# Patient Record
Sex: Female | Born: 1985 | Race: White | Hispanic: No | Marital: Single
Health system: Midwestern US, Community
[De-identification: ages and names within clinical notes are randomized; demographics above are authoritative.]

## PROBLEM LIST (undated history)

## (undated) DIAGNOSIS — F419 Anxiety disorder, unspecified: Secondary | ICD-10-CM

## (undated) DIAGNOSIS — F32A Depression, unspecified: Secondary | ICD-10-CM

## (undated) DIAGNOSIS — A770 Spotted fever due to Rickettsia rickettsii: Secondary | ICD-10-CM

## (undated) DIAGNOSIS — K219 Gastro-esophageal reflux disease without esophagitis: Secondary | ICD-10-CM

## (undated) DIAGNOSIS — F329 Major depressive disorder, single episode, unspecified: Secondary | ICD-10-CM

## (undated) DIAGNOSIS — Z3201 Encounter for pregnancy test, result positive: Secondary | ICD-10-CM

## (undated) DIAGNOSIS — F418 Other specified anxiety disorders: Secondary | ICD-10-CM

## (undated) DIAGNOSIS — O365931 Maternal care for other known or suspected poor fetal growth, third trimester, fetus 1: Secondary | ICD-10-CM

## (undated) DIAGNOSIS — Z3481 Encounter for supervision of other normal pregnancy, first trimester: Principal | ICD-10-CM

## (undated) DIAGNOSIS — J101 Influenza due to other identified influenza virus with other respiratory manifestations: Secondary | ICD-10-CM

## (undated) DIAGNOSIS — Z3493 Encounter for supervision of normal pregnancy, unspecified, third trimester: Secondary | ICD-10-CM

## (undated) DIAGNOSIS — O365930 Maternal care for other known or suspected poor fetal growth, third trimester, not applicable or unspecified: Secondary | ICD-10-CM

## (undated) DIAGNOSIS — O169 Unspecified maternal hypertension, unspecified trimester: Secondary | ICD-10-CM

## (undated) DIAGNOSIS — O365990 Maternal care for other known or suspected poor fetal growth, unspecified trimester, not applicable or unspecified: Secondary | ICD-10-CM

## (undated) DIAGNOSIS — Z3482 Encounter for supervision of other normal pregnancy, second trimester: Secondary | ICD-10-CM

## (undated) DIAGNOSIS — K3189 Other diseases of stomach and duodenum: Secondary | ICD-10-CM

## (undated) DIAGNOSIS — Z3483 Encounter for supervision of other normal pregnancy, third trimester: Secondary | ICD-10-CM

## (undated) HISTORY — PX: TONSILLECTOMY: SUR1361

## (undated) HISTORY — DX: Spotted fever due to Rickettsia rickettsii: A77.0

## (undated) HISTORY — DX: Major depressive disorder, single episode, unspecified: F32.9

## (undated) HISTORY — DX: Gastro-esophageal reflux disease without esophagitis: K21.9

## (undated) HISTORY — DX: Depression, unspecified: F32.A

## (undated) HISTORY — DX: Anxiety disorder, unspecified: F41.9

---
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
## 2007-02-10 ENCOUNTER — Emergency Department (HOSPITAL_COMMUNITY): Admission: EM | Admit: 2007-02-10 | Discharge: 2007-02-10 | Payer: Self-pay | Admitting: Emergency Medicine

## 2007-04-14 ENCOUNTER — Ambulatory Visit: Payer: Self-pay | Admitting: Family Medicine

## 2007-04-14 DIAGNOSIS — J209 Acute bronchitis, unspecified: Secondary | ICD-10-CM | POA: Insufficient documentation

## 2007-04-14 LAB — CONVERTED CEMR LAB: Rapid Strep: NEGATIVE

## 2007-04-24 ENCOUNTER — Telehealth: Payer: Self-pay | Admitting: Family Medicine

## 2007-04-29 ENCOUNTER — Telehealth: Payer: Self-pay | Admitting: Family Medicine

## 2007-05-13 ENCOUNTER — Ambulatory Visit: Payer: Self-pay | Admitting: Family Medicine

## 2007-05-13 DIAGNOSIS — J039 Acute tonsillitis, unspecified: Secondary | ICD-10-CM | POA: Insufficient documentation

## 2007-05-13 LAB — CONVERTED CEMR LAB
Heterophile Ab Screen: NEGATIVE
Rapid Strep: NEGATIVE

## 2007-05-18 ENCOUNTER — Ambulatory Visit: Payer: Self-pay | Admitting: Internal Medicine

## 2007-05-19 LAB — CONVERTED CEMR LAB
Basophils Absolute: 0 10*3/uL (ref 0.0–0.1)
Basophils Relative: 0 % (ref 0.0–1.0)
EBV NA IgG: 0.45
EBV VCA IgG: 0.23
EBV VCA IgM: 0.22
Eosinophils Absolute: 0.1 10*3/uL (ref 0.0–0.6)
Eosinophils Relative: 0.9 % (ref 0.0–5.0)
HCT: 42 % (ref 36.0–46.0)
Hemoglobin: 14 g/dL (ref 12.0–15.0)
Lymphocytes Relative: 23.7 % (ref 12.0–46.0)
MCHC: 33.3 g/dL (ref 30.0–36.0)
MCV: 94.4 fL (ref 78.0–100.0)
Monocytes Absolute: 0.4 10*3/uL (ref 0.2–0.7)
Monocytes Relative: 4.8 % (ref 3.0–11.0)
Neutro Abs: 5.9 10*3/uL (ref 1.4–7.7)
Neutrophils Relative %: 70.6 % (ref 43.0–77.0)
Platelets: 334 10*3/uL (ref 150–400)
RBC: 4.45 M/uL (ref 3.87–5.11)
RDW: 11.6 % (ref 11.5–14.6)
WBC: 8.4 10*3/uL (ref 4.5–10.5)

## 2007-05-29 ENCOUNTER — Emergency Department (HOSPITAL_COMMUNITY): Admission: EM | Admit: 2007-05-29 | Discharge: 2007-05-30 | Payer: Self-pay | Admitting: Emergency Medicine

## 2007-06-05 ENCOUNTER — Encounter (INDEPENDENT_AMBULATORY_CARE_PROVIDER_SITE_OTHER): Payer: Self-pay | Admitting: Otolaryngology

## 2007-06-05 ENCOUNTER — Encounter: Payer: Self-pay | Admitting: Family Medicine

## 2007-06-05 ENCOUNTER — Ambulatory Visit (HOSPITAL_BASED_OUTPATIENT_CLINIC_OR_DEPARTMENT_OTHER): Admission: RE | Admit: 2007-06-05 | Discharge: 2007-06-05 | Payer: Self-pay | Admitting: Otolaryngology

## 2007-08-05 ENCOUNTER — Ambulatory Visit: Payer: Self-pay | Admitting: Family Medicine

## 2007-08-05 DIAGNOSIS — J019 Acute sinusitis, unspecified: Secondary | ICD-10-CM

## 2007-08-05 LAB — CONVERTED CEMR LAB: Rapid Strep: NEGATIVE

## 2008-04-05 ENCOUNTER — Ambulatory Visit: Payer: Self-pay | Admitting: Internal Medicine

## 2008-04-05 ENCOUNTER — Ambulatory Visit: Payer: Self-pay | Admitting: Family Medicine

## 2008-04-05 ENCOUNTER — Inpatient Hospital Stay (HOSPITAL_COMMUNITY): Admission: EM | Admit: 2008-04-05 | Discharge: 2008-04-06 | Payer: Self-pay | Admitting: Emergency Medicine

## 2008-04-05 DIAGNOSIS — N739 Female pelvic inflammatory disease, unspecified: Secondary | ICD-10-CM | POA: Insufficient documentation

## 2008-04-05 LAB — CONVERTED CEMR LAB
Bilirubin Urine: NEGATIVE
Glucose, Urine, Semiquant: NEGATIVE
Ketones, urine, test strip: NEGATIVE
Nitrite: NEGATIVE
Specific Gravity, Urine: 1.02
Urobilinogen, UA: 1
pH: 7

## 2008-04-06 ENCOUNTER — Encounter: Payer: Self-pay | Admitting: Family Medicine

## 2008-04-06 LAB — CONVERTED CEMR LAB
Chlamydia, DNA Probe: NEGATIVE
GC Probe Amp, Genital: NEGATIVE

## 2008-04-11 ENCOUNTER — Ambulatory Visit: Payer: Self-pay | Admitting: Family Medicine

## 2008-04-11 DIAGNOSIS — R1032 Left lower quadrant pain: Secondary | ICD-10-CM | POA: Insufficient documentation

## 2008-05-18 ENCOUNTER — Ambulatory Visit: Payer: Self-pay | Admitting: Family Medicine

## 2008-05-18 DIAGNOSIS — J029 Acute pharyngitis, unspecified: Secondary | ICD-10-CM | POA: Insufficient documentation

## 2008-05-18 LAB — CONVERTED CEMR LAB: Rapid Strep: NEGATIVE

## 2008-06-21 ENCOUNTER — Ambulatory Visit: Payer: Self-pay | Admitting: Family Medicine

## 2008-06-21 DIAGNOSIS — F329 Major depressive disorder, single episode, unspecified: Secondary | ICD-10-CM

## 2008-06-21 DIAGNOSIS — K219 Gastro-esophageal reflux disease without esophagitis: Secondary | ICD-10-CM | POA: Insufficient documentation

## 2008-06-21 LAB — CONVERTED CEMR LAB
Beta hcg, urine, semiquantitative: NEGATIVE
Bilirubin Urine: NEGATIVE
Blood in Urine, dipstick: NEGATIVE
Glucose, Urine, Semiquant: NEGATIVE
Ketones, urine, test strip: NEGATIVE
Nitrite: NEGATIVE
Specific Gravity, Urine: 1.025
Urobilinogen, UA: 0.2
WBC Urine, dipstick: NEGATIVE
pH: 6.5

## 2009-04-18 ENCOUNTER — Ambulatory Visit: Payer: Self-pay | Admitting: Family Medicine

## 2009-04-18 ENCOUNTER — Telehealth: Payer: Self-pay | Admitting: Family Medicine

## 2009-04-19 LAB — CONVERTED CEMR LAB
ALT: 17 units/L (ref 0–35)
AST: 20 units/L (ref 0–37)
Albumin: 4.2 g/dL (ref 3.5–5.2)
Alkaline Phosphatase: 48 units/L (ref 39–117)
BUN: 11 mg/dL (ref 6–23)
Basophils Absolute: 0 10*3/uL (ref 0.0–0.1)
Basophils Relative: 0.4 % (ref 0.0–3.0)
Bilirubin, Direct: 0.1 mg/dL (ref 0.0–0.3)
CO2: 30 meq/L (ref 19–32)
Calcium: 9.3 mg/dL (ref 8.4–10.5)
Chloride: 106 meq/L (ref 96–112)
Creatinine, Ser: 0.8 mg/dL (ref 0.4–1.2)
Eosinophils Absolute: 0.2 10*3/uL (ref 0.0–0.7)
Eosinophils Relative: 2.1 % (ref 0.0–5.0)
GFR calc non Af Amer: 95.66 mL/min (ref >60)
Glucose, Bld: 90 mg/dL (ref 70–99)
HCT: 42.4 % (ref 36.0–46.0)
Hemoglobin: 14.2 g/dL (ref 12.0–15.0)
Lymphocytes Relative: 32.4 % (ref 12.0–46.0)
Lymphs Abs: 2.5 10*3/uL (ref 0.7–4.0)
MCHC: 33.5 g/dL (ref 30.0–36.0)
MCV: 96.2 fL (ref 78.0–100.0)
Monocytes Absolute: 1 10*3/uL (ref 0.1–1.0)
Monocytes Relative: 12.7 % — ABNORMAL HIGH (ref 3.0–12.0)
Neutro Abs: 3.9 10*3/uL (ref 1.4–7.7)
Neutrophils Relative %: 52.4 % (ref 43.0–77.0)
Platelets: 227 10*3/uL (ref 150.0–400.0)
Potassium: 3.9 meq/L (ref 3.5–5.1)
RBC: 4.41 M/uL (ref 3.87–5.11)
RDW: 12.1 % (ref 11.5–14.6)
Sodium: 140 meq/L (ref 135–145)
TSH: 0.83 microintl units/mL (ref 0.35–5.50)
Total Bilirubin: 0.8 mg/dL (ref 0.3–1.2)
Total Protein: 6.7 g/dL (ref 6.0–8.3)
WBC: 7.6 10*3/uL (ref 4.5–10.5)

## 2009-05-26 ENCOUNTER — Ambulatory Visit: Payer: Self-pay | Admitting: Family Medicine

## 2009-05-26 DIAGNOSIS — R1031 Right lower quadrant pain: Secondary | ICD-10-CM

## 2009-05-26 DIAGNOSIS — F411 Generalized anxiety disorder: Secondary | ICD-10-CM | POA: Insufficient documentation

## 2009-09-05 ENCOUNTER — Encounter: Payer: Self-pay | Admitting: Family Medicine

## 2009-11-13 ENCOUNTER — Ambulatory Visit: Payer: Self-pay | Admitting: Family Medicine

## 2009-11-13 DIAGNOSIS — M545 Low back pain: Secondary | ICD-10-CM

## 2009-11-13 DIAGNOSIS — N912 Amenorrhea, unspecified: Secondary | ICD-10-CM

## 2009-11-13 LAB — CONVERTED CEMR LAB
Beta hcg, urine, semiquantitative: NEGATIVE
Bilirubin Urine: NEGATIVE
Blood in Urine, dipstick: NEGATIVE
Glucose, Urine, Semiquant: NEGATIVE
Ketones, urine, test strip: NEGATIVE
Nitrite: NEGATIVE
Protein, U semiquant: NEGATIVE
Specific Gravity, Urine: 1.02
Urobilinogen, UA: 0.2
pH: 7

## 2009-11-24 ENCOUNTER — Ambulatory Visit: Payer: Self-pay | Admitting: Family Medicine

## 2009-11-24 ENCOUNTER — Emergency Department (HOSPITAL_COMMUNITY): Admission: EM | Admit: 2009-11-24 | Discharge: 2009-11-24 | Payer: Self-pay | Admitting: Emergency Medicine

## 2009-11-24 DIAGNOSIS — R112 Nausea with vomiting, unspecified: Secondary | ICD-10-CM

## 2009-11-24 DIAGNOSIS — R5383 Other fatigue: Secondary | ICD-10-CM

## 2009-11-24 DIAGNOSIS — R5381 Other malaise: Secondary | ICD-10-CM

## 2009-11-25 ENCOUNTER — Encounter: Payer: Self-pay | Admitting: Family Medicine

## 2009-11-25 ENCOUNTER — Emergency Department (HOSPITAL_COMMUNITY): Admission: EM | Admit: 2009-11-25 | Discharge: 2009-11-26 | Payer: Self-pay | Admitting: Emergency Medicine

## 2009-11-26 ENCOUNTER — Emergency Department (HOSPITAL_COMMUNITY): Admission: EM | Admit: 2009-11-26 | Discharge: 2009-11-26 | Payer: Self-pay | Admitting: Emergency Medicine

## 2009-11-29 ENCOUNTER — Ambulatory Visit: Payer: Self-pay | Admitting: Family Medicine

## 2009-12-13 ENCOUNTER — Ambulatory Visit: Payer: Self-pay | Admitting: Family Medicine

## 2009-12-13 DIAGNOSIS — R51 Headache: Secondary | ICD-10-CM

## 2009-12-13 DIAGNOSIS — R519 Headache, unspecified: Secondary | ICD-10-CM | POA: Insufficient documentation

## 2009-12-15 ENCOUNTER — Ambulatory Visit: Payer: Self-pay | Admitting: Family Medicine

## 2009-12-17 ENCOUNTER — Emergency Department (HOSPITAL_COMMUNITY): Admission: EM | Admit: 2009-12-17 | Discharge: 2009-12-17 | Payer: Self-pay | Admitting: Emergency Medicine

## 2009-12-18 ENCOUNTER — Telehealth: Payer: Self-pay | Admitting: Family Medicine

## 2009-12-20 ENCOUNTER — Ambulatory Visit: Payer: Self-pay | Admitting: Family Medicine

## 2009-12-25 ENCOUNTER — Ambulatory Visit: Payer: Self-pay | Admitting: Family Medicine

## 2010-01-09 ENCOUNTER — Telehealth: Payer: Self-pay | Admitting: Family Medicine

## 2010-01-09 ENCOUNTER — Emergency Department (HOSPITAL_COMMUNITY): Admission: EM | Admit: 2010-01-09 | Discharge: 2010-01-09 | Payer: Self-pay | Admitting: Emergency Medicine

## 2010-01-10 ENCOUNTER — Ambulatory Visit: Payer: Self-pay | Admitting: Family Medicine

## 2010-01-14 ENCOUNTER — Inpatient Hospital Stay (HOSPITAL_COMMUNITY): Admission: AD | Admit: 2010-01-14 | Discharge: 2010-01-15 | Payer: Self-pay | Admitting: Obstetrics and Gynecology

## 2010-01-17 ENCOUNTER — Encounter: Payer: Self-pay | Admitting: Family Medicine

## 2010-01-27 ENCOUNTER — Emergency Department (HOSPITAL_COMMUNITY): Admission: EM | Admit: 2010-01-27 | Discharge: 2010-01-27 | Payer: Self-pay | Admitting: Emergency Medicine

## 2010-01-30 ENCOUNTER — Ambulatory Visit: Payer: Self-pay | Admitting: Family Medicine

## 2010-01-31 ENCOUNTER — Telehealth: Payer: Self-pay | Admitting: Family Medicine

## 2010-01-31 LAB — CONVERTED CEMR LAB
Basophils Absolute: 0 10*3/uL (ref 0.0–0.1)
Basophils Relative: 0.2 % (ref 0.0–3.0)
Eosinophils Absolute: 0 10*3/uL (ref 0.0–0.7)
Eosinophils Relative: 0.2 % (ref 0.0–5.0)
Free T4: 1.25 ng/dL (ref 0.60–1.60)
HCT: 39.3 % (ref 36.0–46.0)
Hemoglobin: 13.7 g/dL (ref 12.0–15.0)
Lymphocytes Relative: 19.6 % (ref 12.0–46.0)
Lymphs Abs: 2 10*3/uL (ref 0.7–4.0)
MCHC: 34.9 g/dL (ref 30.0–36.0)
MCV: 95 fL (ref 78.0–100.0)
Monocytes Absolute: 0.7 10*3/uL (ref 0.1–1.0)
Monocytes Relative: 7.1 % (ref 3.0–12.0)
Neutro Abs: 7.4 10*3/uL (ref 1.4–7.7)
Neutrophils Relative %: 72.9 % (ref 43.0–77.0)
Platelets: 275 10*3/uL (ref 150.0–400.0)
RBC: 4.14 M/uL (ref 3.87–5.11)
RDW: 13.5 % (ref 11.5–14.6)
Sed Rate: 10 mm/hr (ref 0–22)
T3, Free: 3.7 pg/mL (ref 2.3–4.2)
TSH: 0.34 microintl units/mL — ABNORMAL LOW (ref 0.35–5.50)
WBC: 10.2 10*3/uL (ref 4.5–10.5)

## 2010-02-02 ENCOUNTER — Telehealth: Payer: Self-pay | Admitting: Family Medicine

## 2010-02-02 DIAGNOSIS — G479 Sleep disorder, unspecified: Secondary | ICD-10-CM | POA: Insufficient documentation

## 2010-02-16 ENCOUNTER — Ambulatory Visit: Payer: Self-pay | Admitting: Family Medicine

## 2010-02-19 ENCOUNTER — Ambulatory Visit: Payer: Self-pay | Admitting: Pulmonary Disease

## 2010-02-20 ENCOUNTER — Telehealth: Payer: Self-pay | Admitting: Pulmonary Disease

## 2010-02-28 ENCOUNTER — Emergency Department (HOSPITAL_COMMUNITY)
Admission: EM | Admit: 2010-02-28 | Discharge: 2010-03-01 | Payer: Self-pay | Source: Home / Self Care | Admitting: Emergency Medicine

## 2010-03-01 ENCOUNTER — Ambulatory Visit
Admission: RE | Admit: 2010-03-01 | Discharge: 2010-03-01 | Payer: Self-pay | Source: Home / Self Care | Attending: Family Medicine | Admitting: Family Medicine

## 2010-03-01 ENCOUNTER — Encounter: Payer: Self-pay | Admitting: Family Medicine

## 2010-03-01 DIAGNOSIS — R634 Abnormal weight loss: Secondary | ICD-10-CM

## 2010-03-01 DIAGNOSIS — Z9189 Other specified personal risk factors, not elsewhere classified: Secondary | ICD-10-CM | POA: Insufficient documentation

## 2010-03-02 ENCOUNTER — Emergency Department (HOSPITAL_COMMUNITY)
Admission: EM | Admit: 2010-03-02 | Discharge: 2010-03-02 | Payer: Self-pay | Source: Home / Self Care | Admitting: Emergency Medicine

## 2010-03-02 ENCOUNTER — Encounter: Payer: Self-pay | Admitting: Family Medicine

## 2010-03-08 ENCOUNTER — Ambulatory Visit
Admission: RE | Admit: 2010-03-08 | Discharge: 2010-03-08 | Payer: Self-pay | Source: Home / Self Care | Attending: Family Medicine | Admitting: Family Medicine

## 2010-03-08 DIAGNOSIS — R1319 Other dysphagia: Secondary | ICD-10-CM | POA: Insufficient documentation

## 2010-03-08 DIAGNOSIS — Z8669 Personal history of other diseases of the nervous system and sense organs: Secondary | ICD-10-CM | POA: Insufficient documentation

## 2010-03-09 ENCOUNTER — Encounter: Payer: Self-pay | Admitting: Family Medicine

## 2010-03-13 ENCOUNTER — Telehealth: Payer: Self-pay | Admitting: Family Medicine

## 2010-03-16 LAB — CONVERTED CEMR LAB: EBV VCA IgG: 3 — ABNORMAL HIGH

## 2010-03-27 ENCOUNTER — Ambulatory Visit (HOSPITAL_COMMUNITY)
Admission: RE | Admit: 2010-03-27 | Discharge: 2010-03-27 | Payer: Self-pay | Source: Home / Self Care | Attending: Neurology | Admitting: Neurology

## 2010-04-03 NOTE — Assessment & Plan Note (Signed)
Summary: nausea/vomitting/cjr   Vital Signs:  Patient profile:   25 year old female Weight:      150 pounds Temp:     97.6 degrees F oral BP sitting:   90 / 72  (left arm) Cuff size:   regular  Vitals Entered By: Raechel Ache, RN (May 26, 2009 2:01 PM) CC: C/o nausea, vomiting all night and RLQ pain- thinks she has an ovarian cyst.   History of Present Illness: Here for several reasons. First she has had recurrent ovarian cysts for the past several years, and she thinks she has one now. She has had several  days of mild sharp RLQ pains which feel exactly like the other cysts felt. No fevers. No change in urinary or bowel habits. Also, she has had 24 hours of nausea and vomitting which is a little better today. last she would like to get back on Prozac since her anxiety is getting worse again.   Allergies: 1)  ! Levaquin 2)  ! Doxycycline 3)  ! Vicodin  Past History:  Past Medical History: Reviewed history from 06/21/2008 and no changes required. [place] Spotted Fever 2008 GERD Depression  Past Surgical History: Reviewed history from 04/11/2008 and no changes required.  Tonsillectomy  Review of Systems  The patient denies anorexia, fever, weight loss, weight gain, vision loss, decreased hearing, hoarseness, chest pain, syncope, dyspnea on exertion, peripheral edema, prolonged cough, headaches, hemoptysis, melena, hematochezia, severe indigestion/heartburn, hematuria, incontinence, genital sores, muscle weakness, suspicious skin lesions, transient blindness, difficulty walking, depression, unusual weight change, abnormal bleeding, enlarged lymph nodes, angioedema, breast masses, and testicular masses.    Physical Exam  General:  Well-developed,well-nourished,in no acute distress; alert,appropriate and cooperative throughout examination Head:  Normocephalic and atraumatic without obvious abnormalities. No apparent alopecia or balding. Eyes:  No corneal or  conjunctival inflammation noted. EOMI. Perrla. Funduscopic exam benign, without hemorrhages, exudates or papilledema. Vision grossly normal. Ears:  External ear exam shows no significant lesions or deformities.  Otoscopic examination reveals clear canals, tympanic membranes are intact bilaterally without bulging, retraction, inflammation or discharge. Hearing is grossly normal bilaterally. Nose:  External nasal examination shows no deformity or inflammation. Nasal mucosa are pink and moist without lesions or exudates. Mouth:  Oral mucosa and oropharynx without lesions or exudates.  Teeth in good repair. Lungs:  Normal respiratory effort, chest expands symmetrically. Lungs are clear to auscultation, no crackles or wheezes. Heart:  Normal rate and regular rhythm. S1 and S2 normal without gallop, murmur, click, rub or other extra sounds. Abdomen:  Bowel sounds positive,abdomen soft and non-tender without masses, organomegaly or hernias noted. Psych:  Oriented X3, memory intact for recent and remote, normally interactive, good eye contact, and slightly anxious.     Impression & Recommendations:  Problem # 1:  GASTROENTERITIS (ICD-558.9)  Problem # 2:  ANXIETY STATE, UNSPECIFIED (ICD-300.00)  Her updated medication list for this problem includes:    Prozac 40 Mg Caps (Fluoxetine hcl) ..... Once daily  Problem # 3:  ABDOMINAL PAIN, RIGHT LOWER QUADRANT (ICD-789.03)  Complete Medication List: 1)  Prozac 40 Mg Caps (Fluoxetine hcl) .... Once daily 2)  Omeprazole 40 Mg Cpdr (Omeprazole) .... Once daily 3)  Hydromet 5-1.5 Mg/86ml Syrp (Hydrocodone-homatropine) .Marland Kitchen.. 1 tsp every 4 hours as needed for cough  Other Orders: Gynecologic Referral (Gyn)  Patient Instructions: 1)  She will drink fluids and use Phenergan (which she has at home) for nausea. This should resolve quickly. Given a work note for today  through 05-30-09. She does seem to be having more problems with ovarian cysts, so we will refer  her to GYN. Start back on Prozac daily.  Prescriptions: PROZAC 40 MG CAPS (FLUOXETINE HCL) once daily  #30 x 5   Entered and Authorized by:   Nelwyn Salisbury MD   Signed by:   Nelwyn Salisbury MD on 05/26/2009   Method used:   Electronically to        [facility]* (retail)       [address]       [address] Shipping/mailing       [place], [place]  [address]       Ph: [phone]       Fax: [phone]   RxID:   [id number]

## 2010-04-03 NOTE — Progress Notes (Signed)
Summary: Pt req to get Ultrasound ordered asap today.   Phone Note Call from Patient Call back at [phone]   Caller: Patient Summary of Call: Pt called and is req Dr. Clent Ridges to order an ultrasound for today if at all possible. Pt took an at home pregnacy test and it came back positive. Pt is concerned about all the meds she has been taking the last 3 month and has been having pains in lower rt abdomen. Pt needs ultrasound done asap to see how far along the pregnancy is.  Initial call taken by: Lucy Antigua,  January 09, 2010 9:27 AM  Follow-up for Phone Call        she needs to go to the Maternity Admissions area at [facility] and they can do an [place] right there  Follow-up by: Nelwyn Salisbury MD,  January 09, 2010 1:22 PM  Additional Follow-up for Phone Call Additional follow up Details #1::        [facility] Additional Follow-up by: Lynann Beaver CMA AAMA,  January 09, 2010 2:17 PM    Additional Follow-up for Phone Call Additional follow up Details #2::    Pt did go to [facility] yesterday,and everything went well.   Follow-up by: Lynann Beaver CMA AAMA,  January 10, 2010 8:23 AM

## 2010-04-03 NOTE — Assessment & Plan Note (Signed)
Summary: fever/congestion/cold/njr   Vital Signs:  Patient profile:   25 year old female Weight:      151 pounds Temp:     98.2 degrees F oral Pulse rate:   92 / minute BP sitting:   114 / 66  (left arm) Cuff size:   regular  Vitals Entered By: Alfred Levins, CMA (April 18, 2009 9:34 AM) CC: cough   History of Present Illness: Here with her father for 3 days of chest burning, SOB, and a dry cough. No fever. She just moved back to [place] after spending a year going to school in [place]. While there the past year, she has had multiple episodes of upper respiratory infections, and they suggested she have a full workup to find out why.   Allergies: 1)  ! Levaquin 2)  ! Doxycycline 3)  ! Vicodin  Past History:  Past Medical History: Reviewed history from 06/21/2008 and no changes required. [place] Spotted Fever 2008 GERD Depression  Past Surgical History: Reviewed history from 04/11/2008 and no changes required.  Tonsillectomy  Review of Systems  The patient denies anorexia, fever, weight loss, weight gain, vision loss, decreased hearing, hoarseness, chest pain, syncope, dyspnea on exertion, peripheral edema, headaches, hemoptysis, abdominal pain, melena, hematochezia, severe indigestion/heartburn, hematuria, incontinence, genital sores, muscle weakness, suspicious skin lesions, transient blindness, difficulty walking, depression, unusual weight change, abnormal bleeding, enlarged lymph nodes, angioedema, breast masses, and testicular masses.    Physical Exam  General:  alert but appears ill, coughing Head:  Normocephalic and atraumatic without obvious abnormalities. No apparent alopecia or balding. Eyes:  No corneal or conjunctival inflammation noted. EOMI. Perrla. Funduscopic exam benign, without hemorrhages, exudates or papilledema. Vision grossly normal. Ears:  External ear exam shows no significant lesions or deformities.  Otoscopic examination reveals  clear canals, tympanic membranes are intact bilaterally without bulging, retraction, inflammation or discharge. Hearing is grossly normal bilaterally. Nose:  External nasal examination shows no deformity or inflammation. Nasal mucosa are pink and moist without lesions or exudates. Mouth:  Oral mucosa and oropharynx without lesions or exudates.  Teeth in good repair. Neck:  No deformities, masses, or tenderness noted. Lungs:  Normal respiratory effort, chest expands symmetrically. Lungs are clear to auscultation, no crackles or wheezes.   Impression & Recommendations:  Problem # 1:  ACUTE BRONCHITIS (ICD-466.0)  Her updated medication list for this problem includes:    Augmentin 875-125 Mg Tabs (Amoxicillin-pot clavulanate) .Marland Kitchen..Marland Kitchen Two times a day  Orders: Venipuncture (78295) TLB-BMP (Basic Metabolic Panel-BMET) (80048-METABOL) TLB-CBC Platelet - w/Differential (85025-CBCD) TLB-Hepatic/Liver Function Pnl (80076-HEPATIC) TLB-TSH (Thyroid Stimulating Hormone) (84443-TSH) T-2 View CXR (71020TC)  Complete Medication List: 1)  Prozac 40 Mg Caps (Fluoxetine hcl) .... Once daily 2)  Omeprazole 40 Mg Cpdr (Omeprazole) .... Once daily 3)  Augmentin 875-125 Mg Tabs (Amoxicillin-pot clavulanate) .... Two times a day  Patient Instructions: 1)  Treat the current episode as above. Get labs and a CXR today. Prescriptions: AUGMENTIN 875-125 MG TABS (AMOXICILLIN-POT CLAVULANATE) two times a day  #20 x 0   Entered and Authorized by:   Nelwyn Salisbury MD   Signed by:   Nelwyn Salisbury MD on 04/18/2009   Method used:   Electronically to        [facility]* (retail)       [address]       [address] Shipping/mailing       [place], [place]  [address]  Ph: [phone]       Fax: [phone]   RxID:   [id number]

## 2010-04-03 NOTE — Assessment & Plan Note (Signed)
Summary: READ TB TEST/CJR  Nurse Visit   Allergies: 1)  ! Levaquin 2)  ! Doxycycline  PPD Results    Date of reading: 12/15/2009    Results: < 5mm    Interpretation: negative

## 2010-04-03 NOTE — Assessment & Plan Note (Signed)
Summary: complete form for to withdraw from school/cjr   Vital Signs:  Patient profile:   25 year old female Weight:      152 pounds O2 Sat:      98 % Temp:     97.9 degrees F Pulse rate:   117 / minute Pulse (ortho):   98 / minute BP sitting:   110 / 80  (left arm)  Vitals Entered By: Pura Spice, RN (January 10, 2010 3:18 PM) CC: pregnant wants form completed for food stamps    History of Present Illness: Here to follow up after a visit yesterday to [facility] for missed periods and abdominal pain. She had a positive at home pregnancy test, and had a positive serum pregnancy test at the ER. They did a pelvic US but did not see anything, so clearly the pregnancy is very early. She already has an appt. with Dr. Marcelle Overlie her ObGyn on 01-12-10. She has been in contact with Velta Addison PA at Dr. Loralie [facility] office, and since she is pregnant he suggested she switch from Prozac to Zoloft. There is some evidence supporting the safety of Zoloft in pregnancy, and we agree that it would be a greater risk to stop all antidepressants at this point. She has stopped the Abilify. The ER diagnosed her with a bacterial vaginosis, so they gave her a 7 day course of Flagyl. She is still set to see Dr. Jake Samples on 02-13-10 for a Neurological evaluation about her HAs, her dizziness, and her difficulty in focusing and concentrating on anything. She lost her job, and in fact she is unable to work at all because she feels so bad right now. She has applied for Medicaid and for diability income, and she brings in a form for me to fill out for this. She has not driven a car for over a month, so her friend has driven her here. She moved out from her parents home about 4 months ago, and she has been living with this friend.   Allergies: 1)  ! Levaquin 2)  ! Doxycycline  Past History:  Past Medical History: [facility] Spotted Fever 2008 GERD Depression Anxiety, sees Velta Addison PA at Dr.  Loralie [facility]agne office sees Dr. Marcelle Overlie for ObGyn care  Past Surgical History: Reviewed history from 04/11/2008 and no changes required.  Tonsillectomy  Review of Systems  The patient denies anorexia, fever, weight loss, weight gain, vision loss, decreased hearing, hoarseness, chest pain, syncope, dyspnea on exertion, peripheral edema, prolonged cough, hemoptysis, melena, hematochezia, severe indigestion/heartburn, hematuria, incontinence, genital sores, muscle weakness, suspicious skin lesions, transient blindness, difficulty walking, depression, unusual weight change, abnormal bleeding, enlarged lymph nodes, angioedema, breast masses, and testicular masses.    Physical Exam  General:  alert but weak, speaks very softly, seems easily distracted  Lungs:  Normal respiratory effort, chest expands symmetrically. Lungs are clear to auscultation, no crackles or wheezes. Heart:  Normal rate and regular rhythm. S1 and S2 normal without gallop, murmur, click, rub or other extra sounds. Abdomen:  Bowel sounds positive,abdomen soft and non-tender without masses, organomegaly or hernias noted. Neurologic:  alert & oriented X3, cranial nerves II-XII intact, strength normal in all extremities, and gait normal.   Psych:  Oriented X3, flat affect, subdued, poor eye contact, and moderately anxious.     Impression & Recommendations:  Problem # 1:  HEADACHE (ICD-784.0)  Her updated medication list for this problem includes:    Diclofenac Sodium 50 Mg Tbec (Diclofenac  sodium) .Marland Kitchen... Three times a day as needed for pain    Vicodin 5-500 Mg Tabs (Hydrocodone-acetaminophen) .Marland Kitchen... 1 q 6 hours as needed pain    Fioricet 50-325-40 Mg Tabs (Butalbital-apap-caffeine) .Marland Kitchen... 1 q 6 hours as needed for ha  Problem # 2:  ANXIETY (ICD-300.00)  Her updated medication list for this problem includes:    Alprazolam 1 Mg Tabs (Alprazolam) .Marland Kitchen... 1 q 6 hours as needed for anxiety    Prozac 40 Mg Caps (Fluoxetine hcl) .....  Once daily  Problem # 3:  WEAKNESS (ICD-780.79)  Problem # 4:  DEPRESSION (ICD-311)  Her updated medication list for this problem includes:    Alprazolam 1 Mg Tabs (Alprazolam) .Marland Kitchen... 1 q 6 hours as needed for anxiety    Prozac 40 Mg Caps (Fluoxetine hcl) ..... Once daily  Problem # 5:  PREGNANCY (ICD-V22.2)  Complete Medication List: 1)  Diclofenac Sodium 50 Mg Tbec (Diclofenac sodium) .... Three times a day as needed for pain 2)  Alprazolam 1 Mg Tabs (Alprazolam) .Marland Kitchen.. 1 q 6 hours as needed for anxiety 3)  Prozac 40 Mg Caps (Fluoxetine hcl) .... Once daily 4)  Vicodin 5-500 Mg Tabs (Hydrocodone-acetaminophen) .Marland Kitchen.. 1 q 6 hours as needed pain 5)  Abilify 2 Mg Tabs (Aripiprazole) .... Once daily 6)  Fioricet 50-325-40 Mg Tabs (Butalbital-apap-caffeine) .Marland Kitchen.. 1 q 6 hours as needed for ha  Patient Instructions: 1)  I did fill out the application form she brought in, since I do feel she is totally unable to work for the time being. She will follow up with the specialists mentioned above.  2)  Please schedule a follow-up appointment as needed .    Orders Added: 1)  Est. Patient Level IV [64332]

## 2010-04-03 NOTE — Progress Notes (Signed)
Summary: QUESTION  Phone Note Call from Patient   Caller: Patient  [phone] Summary of Call: Pt called to see is she can obtain a referral to psychiatry...Marland KitchenMarland Kitchen Pt states she hasn't called for an appt yet because she wants to know who Dr Clent Ridges would recommend..... Pt can be reached at [phone].  Initial call taken by: Debbra Riding,  December 18, 2009 11:01 AM  Follow-up for Phone Call        I agree, and I would recommend seeing the office of Dr. Emerson Monte  Follow-up by: Nelwyn Salisbury MD,  December 18, 2009 1:09 PM  Additional Follow-up for Phone Call Additional follow up Details #1::        Phone Call Completed Additional Follow-up by: Rudy Jew, RN,  December 18, 2009 2:34 PM

## 2010-04-03 NOTE — Consult Note (Signed)
Summary: [facility]  [facility]   Imported By: Maryln Gottron 02/01/2010 10:56:46  _____________________________________________________________________  External Attachment:    Type:   Image     Comment:   External Document

## 2010-04-03 NOTE — Progress Notes (Signed)
Summary: Referral for sleep disorder request  Phone Note Call from Patient Call back at [phone]   Caller: Patient Call For: Nelwyn Salisbury MD Summary of Call: VM from pt requesting referral, sleep disorder concerns Initial call taken by: Sid Falcon LPN,  February 02, 2010 2:53 PM  Follow-up for Phone Call        refer to Dr. Marcelyn Bruins for sleep disorder evaluation  Follow-up by: Nelwyn Salisbury MD,  February 05, 2010 1:16 PM  Additional Follow-up for Phone Call Additional follow up Details #1::        done  Additional Follow-up by: Pura Spice, RN,  February 05, 2010 2:04 PM  New Problems: SLEEP DISORDER (ICD-780.50)   New Problems: SLEEP DISORDER (ICD-780.50)

## 2010-04-03 NOTE — Assessment & Plan Note (Signed)
Summary: er fup-? flu//ccm   Vital Signs:  Patient profile:   25 year old female O2 Sat:      98 % Temp:     98.3 degrees F Pulse rate:   90 / minute BP sitting:   128 / 86  (left arm)  Vitals Entered By: Pura Spice, RN (November 29, 2009 3:31 PM) CC: follow up ER visit. anxiety  and was told "flu"    History of Present Illness: Here to follow up after she was seen here on 11-26-09 for HAs and vomitting, when we sent her to the ER for further workup. While there she got some IV fluids. Her labs were negative except for a mildly elevated WBC count. She was told she had a viral infection and that she was having panic attacks. She was sent back home. Since then the vomitting has stopped, and the anxiety is a little better. However she still has HAs, sinus pressure, PND, a dry cough, and high levels of anxiety. No fever. Drinking fluids. She has been treated for anxiety in the past, and she did well on Prozac for a time.   Allergies: 1)  ! Levaquin 2)  ! Doxycycline 3)  ! Vicodin  Past History:  Past Medical History: [place] Spotted Fever 2008 GERD Depression Anxiety  Past Surgical History: Reviewed history from 04/11/2008 and no changes required.  Tonsillectomy  Review of Systems  The patient denies anorexia, fever, weight loss, weight gain, vision loss, decreased hearing, hoarseness, chest pain, syncope, dyspnea on exertion, peripheral edema, hemoptysis, abdominal pain, melena, hematochezia, severe indigestion/heartburn, hematuria, incontinence, genital sores, muscle weakness, suspicious skin lesions, transient blindness, difficulty walking, depression, unusual weight change, abnormal bleeding, enlarged lymph nodes, angioedema, breast masses, and testicular masses.    Physical Exam  General:  Well-developed,well-nourished,in no acute distress; alert,appropriate and cooperative throughout examination Head:  Normocephalic and atraumatic without obvious abnormalities.  No apparent alopecia or balding. Eyes:  No corneal or conjunctival inflammation noted. EOMI. Perrla. Funduscopic exam benign, without hemorrhages, exudates or papilledema. Vision grossly normal. Ears:  External ear exam shows no significant lesions or deformities.  Otoscopic examination reveals clear canals, tympanic membranes are intact bilaterally without bulging, retraction, inflammation or discharge. Hearing is grossly normal bilaterally. Nose:  External nasal examination shows no deformity or inflammation. Nasal mucosa are pink and moist without lesions or exudates. Mouth:  Oral mucosa and oropharynx without lesions or exudates.  Teeth in good repair. Neck:  No deformities, masses, or tenderness noted. Lungs:  Normal respiratory effort, chest expands symmetrically. Lungs are clear to auscultation, no crackles or wheezes. Psych:  Oriented X3, memory intact for recent and remote, normally interactive, good eye contact, and moderately anxious.     Impression & Recommendations:  Problem # 1:  ANXIETY (ICD-300.00)  Her updated medication list for this problem includes:    Hydroxyzine Hcl 25 Mg Tabs (Hydroxyzine hcl) .Marland Kitchen... 1 qid as needed    Prozac 20 Mg Caps (Fluoxetine hcl) ..... Once daily    Alprazolam 1 Mg Tabs (Alprazolam) .Marland Kitchen... 1 q 6 hours as needed for anxiety  Problem # 2:  ACUTE SINUSITIS, UNSPECIFIED (ICD-461.9)  Her updated medication list for this problem includes:    Zithromax Z-pak 250 Mg Tabs (Azithromycin) .Marland Kitchen... As directed  Complete Medication List: 1)  Diclofenac Sodium 50 Mg Tbec (Diclofenac sodium) .... Three times a day as needed for pain 2)  Hydroxyzine Hcl 25 Mg Tabs (Hydroxyzine hcl) .Marland Kitchen.. 1 qid as needed 3)  Zithromax Z-pak 250 Mg Tabs (Azithromycin) .... As directed 4)  Prozac 20 Mg Caps (Fluoxetine hcl) .... Once daily 5)  Alprazolam 1 Mg Tabs (Alprazolam) .Marland Kitchen.. 1 q 6 hours as needed for anxiety  Patient Instructions: 1)  Get back on Prozac with Xanax as  needed.  2)  Please schedule a follow-up appointment in 2 weeks.  Prescriptions: ALPRAZOLAM 1 MG TABS (ALPRAZOLAM) 1 q 6 hours as needed for anxiety  #60 x 2   Entered and Authorized by:   Nelwyn Salisbury MD   Signed by:   Nelwyn Salisbury MD on 11/29/2009   Method used:   Print then Give to Patient   RxID:   [id number] PROZAC 20 MG CAPS (FLUOXETINE HCL) once daily  #30 x 5   Entered and Authorized by:   Nelwyn Salisbury MD   Signed by:   Nelwyn Salisbury MD on 11/29/2009   Method used:   Print then Give to Patient   RxID:   [id number] ZITHROMAX Z-PAK 250 MG TABS (AZITHROMYCIN) as directed  #1 x 0   Entered and Authorized by:   Nelwyn Salisbury MD   Signed by:   Nelwyn Salisbury MD on 11/29/2009   Method used:   Print then Give to Patient   RxID:   [id number]

## 2010-04-03 NOTE — Assessment & Plan Note (Signed)
Summary: DISCUSS DEPRESSION MED/TB INJ AND FLU SHOT//SLM/PT RSC/CJR   Vital Signs:  Patient profile:   25 year old female Weight:      160 pounds O2 Sat:      98 % Temp:     98.1 degrees F Pulse rate:   91 / minute BP sitting:   120 / 80  Vitals Entered By: Pura Spice, RN (December 13, 2009 1:35 PM) CC: discuss depression and wants TB tine flu shot    History of Present Illness: She is feeling better with less depresison, but she still deals with a lot of anxiety. Averaging one Xanax a day. Having some HAs. She has been back to work.   Preventive Screening-Counseling & Management  Alcohol-Tobacco     Smoking Status: current  Allergies: 1)  ! Levaquin 2)  ! Doxycycline  Past History:  Past Medical History: Reviewed history from 11/29/2009 and no changes required. [place] Spotted Fever 2008 GERD Depression Anxiety  Social History: Smoking Status:  current  Review of Systems  The patient denies anorexia, fever, weight loss, weight gain, vision loss, decreased hearing, hoarseness, chest pain, syncope, dyspnea on exertion, peripheral edema, prolonged cough, hemoptysis, abdominal pain, melena, hematochezia, severe indigestion/heartburn, hematuria, incontinence, genital sores, muscle weakness, suspicious skin lesions, transient blindness, difficulty walking, unusual weight change, abnormal bleeding, enlarged lymph nodes, angioedema, breast masses, and testicular masses.         Flu Vaccine Consent Questions     Do you have a history of severe allergic reactions to this vaccine? no    Any prior history of allergic reactions to egg and/or gelatin? no    Do you have a sensitivity to the preservative Thimersol? no    Do you have a past history of Guillan-Barre Syndrome? no    Do you currently have an acute febrile illness? no    Have you ever had a severe reaction to latex? no    Vaccine information given and explained to patient? yes    Are you currently pregnant?  no    Lot Number:[id number]   Exp Date:09/01/2010   Site Given  Left Deltoid IM Pura Spice, RN  December 13, 2009 2:05 PM   Physical Exam  General:  Well-developed,well-nourished,in no acute distress; alert,appropriate and cooperative throughout examination Head:  Normocephalic and atraumatic without obvious abnormalities. No apparent alopecia or balding. Eyes:  No corneal or conjunctival inflammation noted. EOMI. Perrla. Funduscopic exam benign, without hemorrhages, exudates or papilledema. Vision grossly normal. Psych:  Cognition and judgment appear intact. Alert and cooperative with normal attention span and concentration. No apparent delusions, illusions, hallucinations   Impression & Recommendations:  Problem # 1:  ANXIETY (ICD-300.00)  The following medications were removed from the medication list:    Hydroxyzine Hcl 25 Mg Tabs (Hydroxyzine hcl) .Marland Kitchen... 1 qid as needed    Prozac 20 Mg Caps (Fluoxetine hcl) ..... Once daily Her updated medication list for this problem includes:    Alprazolam 1 Mg Tabs (Alprazolam) .Marland Kitchen... 1 q 6 hours as needed for anxiety    Prozac 40 Mg Caps (Fluoxetine hcl) ..... Once daily  Problem # 2:  DEPRESSION (ICD-311)  The following medications were removed from the medication list:    Hydroxyzine Hcl 25 Mg Tabs (Hydroxyzine hcl) .Marland Kitchen... 1 qid as needed    Prozac 20 Mg Caps (Fluoxetine hcl) ..... Once daily Her updated medication list for this problem includes:    Alprazolam 1 Mg Tabs (Alprazolam) .Marland Kitchen... 1 q 6  hours as needed for anxiety    Prozac 40 Mg Caps (Fluoxetine hcl) ..... Once daily  Problem # 3:  HEADACHE (ICD-784.0)  Her updated medication list for this problem includes:    Diclofenac Sodium 50 Mg Tbec (Diclofenac sodium) .Marland Kitchen... Three times a day as needed for pain    Vicodin 5-500 Mg Tabs (Hydrocodone-acetaminophen) .Marland Kitchen... 1 q 6 hours as needed pain  Complete Medication List: 1)  Diclofenac Sodium 50 Mg Tbec (Diclofenac sodium) .... Three  times a day as needed for pain 2)  Alprazolam 1 Mg Tabs (Alprazolam) .Marland Kitchen.. 1 q 6 hours as needed for anxiety 3)  Prozac 40 Mg Caps (Fluoxetine hcl) .... Once daily 4)  Vicodin 5-500 Mg Tabs (Hydrocodone-acetaminophen) .Marland Kitchen.. 1 q 6 hours as needed pain  Other Orders: Admin 1st Vaccine (47425) Flu Vaccine 49yrs + (95638) TB Skin Test (75643) Admin of Any Addtl Vaccine (32951)  Patient Instructions: 1)  We will increase the Prozac to 40 mg a day. Try Vicodin for HAs.  2)  Please schedule a follow-up appointment in 1 month.  Prescriptions: VICODIN 5-500 MG TABS (HYDROCODONE-ACETAMINOPHEN) 1 q 6 hours as needed pain  #60 x 2   Entered and Authorized by:   Nelwyn Salisbury MD   Signed by:   Nelwyn Salisbury MD on 12/13/2009   Method used:   Print then Give to Patient   RxID:   [id number] PROZAC 40 MG CAPS (FLUOXETINE HCL) once daily  #30 x 5   Entered and Authorized by:   Nelwyn Salisbury MD   Signed by:   Nelwyn Salisbury MD on 12/13/2009   Method used:   Print then Give to Patient   RxID:   [id number]    Immunizations Administered:  PPD Skin Test:    Vaccine Type: PPD    Site: left forearm    Mfr: Sanofi Pasteur    Dose: 0.1 ml    Route: ID    Given by: Pura Spice, RN    Exp. Date: 01/04/2011    Lot #: [id number]

## 2010-04-03 NOTE — Letter (Signed)
Summary: Medical Exam Form/[place] [employer]  Medical Exam Form/[place] [employer]   Imported By: Sherian Rein 01/16/2010 13:57:45  _____________________________________________________________________  External Attachment:    Type:   Image     Comment:   External Document

## 2010-04-03 NOTE — Assessment & Plan Note (Signed)
Summary: Pregnancy ????/dm   Vital Signs:  Patient profile:   25 year old female Weight:      165 pounds O2 Sat:      95 % Temp:     98.3 degrees F Pulse rate:   94 / minute BP sitting:   124 / 80  (left arm) Cuff size:   regular  Vitals Entered By: Pura Spice, RN (November 13, 2009 3:42 PM) CC: missed periord LMP v8-7-11 c/o low back pain    History of Present Illness: Here for several reasons. First she wants to have a pregnancy test since she is late. Her LMP was 10-09-09, and she is usually regular. She did a home test this am which was negative. She has had some cramps like a period but no bleeding. No vaginal DC. Also for the past 3 months she has had stiffness and pain in the lower back after she was dropped on the floor by a friend who was horseplaying with her. taking Motrin, using heat.   Allergies: 1)  ! Levaquin 2)  ! Doxycycline 3)  ! Vicodin  Past History:  Past Medical History: Reviewed history from 06/21/2008 and no changes required. [place] Spotted Fever 2008 GERD Depression  Past Surgical History: Reviewed history from 04/11/2008 and no changes required.  Tonsillectomy  Review of Systems  The patient denies anorexia, fever, weight loss, weight gain, vision loss, decreased hearing, hoarseness, chest pain, syncope, dyspnea on exertion, peripheral edema, prolonged cough, headaches, hemoptysis, abdominal pain, melena, hematochezia, severe indigestion/heartburn, hematuria, incontinence, genital sores, muscle weakness, suspicious skin lesions, transient blindness, difficulty walking, depression, unusual weight change, abnormal bleeding, enlarged lymph nodes, angioedema, breast masses, and testicular masses.    Physical Exam  General:  Well-developed,well-nourished,in no acute distress; alert,appropriate and cooperative throughout examination Abdomen:  Bowel sounds positive,abdomen soft and non-tender without masses, organomegaly or hernias  noted. Msk:  tender in the lower back with reduced ROM but negative SLR    Impression & Recommendations:  Problem # 1:  LOW BACK PAIN, MILD (ICD-724.2)  Her updated medication list for this problem includes:    Diclofenac Sodium 50 Mg Tbec (Diclofenac sodium) .Marland Kitchen... Three times a day as needed for pain  Orders: UA Dipstick w/o Micro (automated)  (81003)  Problem # 2:  AMENORRHEA (ICD-626.0)  Orders: Urine Pregnancy Test  ([address])  Problem # 3:  OVARIAN CYST, RUPTURED (ICD-620.2)  Complete Medication List: 1)  Prozac 40 Mg Caps (Fluoxetine hcl) .... Once daily 2)  Omeprazole 40 Mg Cpdr (Omeprazole) .... Once daily 3)  Hydromet 5-1.5 Mg/65ml Syrp (Hydrocodone-homatropine) .Marland Kitchen.. 1 tsp every 4 hours as needed for cough 4)  Diclofenac Sodium 50 Mg Tbec (Diclofenac sodium) .... Three times a day as needed for pain  Patient Instructions: 1)  Try Diclofenac. I also suggested getting some PT, but she will discuss this with her father first since he will be paying for this.  Prescriptions: DICLOFENAC SODIUM 50 MG TBEC (DICLOFENAC SODIUM) three times a day as needed for pain  #60 x 5   Entered and Authorized by:   Nelwyn Salisbury MD   Signed by:   Nelwyn Salisbury MD on 11/13/2009   Method used:   Electronically to        [facility] [phone]* (retail)       [address] [place], [place]  [address]       Ph: [phone] or [phone]  Fax: [phone]   RxID:   [id number]   Laboratory Results   Urine Tests  Date/Time Received: November 13, 2009 3:46 PM  Date/Time Reported: November 13, 2009 3:46 PM   Routine Urinalysis   Color: yellow Appearance: Clear Glucose: negative   (Normal Range: Negative) Bilirubin: negative   (Normal Range: Negative) Ketone: negative   (Normal Range: Negative) Spec. Gravity: 1.020   (Normal Range: 1.003-1.035) Blood: negative   (Normal Range: Negative) pH: 7.0   (Normal Range: 5.0-8.0) Protein: negative   (Normal Range:  Negative) Urobilinogen: 0.2   (Normal Range: 0-1) Nitrite: negative   (Normal Range: Negative) Leukocyte Esterace: trace   (Normal Range: Negative)    Urine HCG: negative Comments: Wynona Canes, CMA  November 13, 2009 3:46 PM       Laboratory Results   Urine Tests    Routine Urinalysis   Color: yellow Appearance: Clear Glucose: negative   (Normal Range: Negative) Bilirubin: negative   (Normal Range: Negative) Ketone: negative   (Normal Range: Negative) Spec. Gravity: 1.020   (Normal Range: 1.003-1.035) Blood: negative   (Normal Range: Negative) pH: 7.0   (Normal Range: 5.0-8.0) Protein: negative   (Normal Range: Negative) Urobilinogen: 0.2   (Normal Range: 0-1) Nitrite: negative   (Normal Range: Negative) Leukocyte Esterace: trace   (Normal Range: Negative)    Urine HCG: negative Comments: Wynona Canes, CMA  November 13, 2009 3:46 PM

## 2010-04-03 NOTE — Assessment & Plan Note (Signed)
Summary: MIGRAINE H/A, ANXIETY // RS   Vital Signs:  Patient profile:   25 year old female O2 Sat:      99 % Temp:     98.7 degrees F (37.06 degrees C) Pulse rate:   99 / minute BP sitting:   120 / 74  Vitals Entered By: Pura Spice, RN (December 20, 2009 11:41 AM) CC: sore throat cough pressure in head stated slept 8:30 am yest til 8:30 this am . states having alot anxiety and xanax not helping  vomiting not eating.    History of Present Illness: Here with her father for several ongoing problems. First, for the past 3 weeks she has been dealing with sinus pressure, HAs, blowing green mucus from the nose, PND, ST, and coughing up green sputum. She has had low grade fevers off and on. Has had nausea and vomitting, and has used some Phenergan. Very little appetite, but she is trying to drink fluids. She has taken a Zpack with no improvement. Also she has been struggling with high levels of anxiety and panic attacks. She has been taking Prozac along with Xanax, but this has not helped at all. Now she feels overwhelmed and out of control. She is not sleeping much at all. She is scheduled to see someone in the office of Dr. Emerson Monte later today for this.   Allergies: 1)  ! Levaquin 2)  ! Doxycycline  Past History:  Past Medical History: Reviewed history from 11/29/2009 and no changes required. [place] Spotted Fever 2008 GERD Depression Anxiety  Past Surgical History: Reviewed history from 04/11/2008 and no changes required.  Tonsillectomy  Review of Systems  The patient denies anorexia, fever, weight loss, weight gain, vision loss, decreased hearing, hoarseness, chest pain, syncope, dyspnea on exertion, peripheral edema, hemoptysis, abdominal pain, melena, hematochezia, severe indigestion/heartburn, hematuria, incontinence, genital sores, muscle weakness, suspicious skin lesions, transient blindness, difficulty walking, depression, unusual weight change, abnormal  bleeding, enlarged lymph nodes, angioedema, breast masses, and testicular masses.    Physical Exam  General:  very disheveled, wearing pajamas and flip flops. Is a bit lethargic but cooperates with the exam Head:  Normocephalic and atraumatic without obvious abnormalities. No apparent alopecia or balding. Eyes:  No corneal or conjunctival inflammation noted. EOMI. Perrla. Funduscopic exam benign, without hemorrhages, exudates or papilledema. Vision grossly normal. Ears:  External ear exam shows no significant lesions or deformities.  Otoscopic examination reveals clear canals, tympanic membranes are intact bilaterally without bulging, retraction, inflammation or discharge. Hearing is grossly normal bilaterally. Nose:  External nasal examination shows no deformity or inflammation. Nasal mucosa are pink and moist without lesions or exudates. Mouth:  Oral mucosa and oropharynx without lesions or exudates.  Teeth in good repair. Neck:  No deformities, masses, or tenderness noted. Lungs:  Normal respiratory effort, chest expands symmetrically. Lungs are clear to auscultation, no crackles or wheezes. Psych:  Oriented X3, memory intact for recent and remote, poor eye contact, tearful, and moderately anxious.     Impression & Recommendations:  Problem # 1:  ACUTE SINUSITIS, UNSPECIFIED (ICD-461.9)  Her updated medication list for this problem includes:    Keflex 500 Mg Caps (Cephalexin) .Marland Kitchen... Three times a day  Orders: Depo- Medrol 80mg  (J1040) Admin of Therapeutic Inj  intramuscular or subcutaneous (45409)  Problem # 2:  ANXIETY (ICD-300.00)  Her updated medication list for this problem includes:    Alprazolam 1 Mg Tabs (Alprazolam) .Marland Kitchen... 1 q 6 hours as needed for anxiety  Prozac 40 Mg Caps (Fluoxetine hcl) ..... Once daily  Complete Medication List: 1)  Diclofenac Sodium 50 Mg Tbec (Diclofenac sodium) .... Three times a day as needed for pain 2)  Alprazolam 1 Mg Tabs (Alprazolam) .Marland Kitchen.. 1  q 6 hours as needed for anxiety 3)  Prozac 40 Mg Caps (Fluoxetine hcl) .... Once daily 4)  Vicodin 5-500 Mg Tabs (Hydrocodone-acetaminophen) .Marland Kitchen.. 1 q 6 hours as needed pain 5)  Keflex 500 Mg Caps (Cephalexin) .... Three times a day  Patient Instructions: 1)  We will treat her sinus infection, but will leave her psychiatric treatment up to Dr. Loralie Champagne office. Her father is driving her today and is very supportive of her.  Prescriptions: KEFLEX 500 MG CAPS (CEPHALEXIN) three times a day  #30 x 0   Entered and Authorized by:   Nelwyn Salisbury MD   Signed by:   Nelwyn Salisbury MD on 12/20/2009   Method used:   Electronically to        [facility]* (retail)       [address]       [address] Shipping/mailing       [place], [place]  [address]       Ph: [phone]       Fax: [phone]   RxID:   [id number]    Medication Administration  Injection # 1:    Medication: Depo- Medrol 80mg     Diagnosis: ACUTE SINUSITIS, UNSPECIFIED (ICD-461.9)    Route: IM    Site: RUOQ gluteus    Exp Date: 6/14    Lot #: [id number]    Mfr: Pharmacia    Comments: 80mg  given    Patient tolerated injection without complications    Given by: Madison Hickman, RN (December 20, 2009 12:22 PM)  Orders Added: 1)  Depo- Medrol 80mg  [J1040] 2)  Admin of Therapeutic Inj  intramuscular or subcutaneous [96372] 3)  Est. Patient Level IV [84132]

## 2010-04-03 NOTE — Assessment & Plan Note (Signed)
Summary: congestion--ok per dr fry//ccm   Vital Signs:  Patient profile:   25 year old female O2 Sat:      99 % Temp:     97.9 degrees F Pulse rate:   88 / minute BP sitting:   130 / 80  (left arm)  Vitals Entered By: Pura Spice, RN (November 24, 2009 4:00 PM) CC: cough congestion seeing spots  legs feel numb achy body states hasn't seen psychiatrist     History of Present Illness: Here with her friend for worsening weakness, lightheadedness, and vomitting. For the past week she has felt bad, and has stayed out of work. She was seen here 2 weeks ago for missing a period when she was afraid she could be pregnant. That day a pregnancy test was negative and her UA was clear. Over the past week she describes nausea with occasional vomitting, diffuse body aches, weakness, and lightheadedness. No abdominal pain. No cough or SOB. No HA or fever. She has vomitted several times in the past 24 hours, including once here in my exam room. She also states that she has had blood in her stools for 2 months, sometimes bright red and sometimes darker red, never black. No urinary symptoms.   Allergies: 1)  ! Levaquin 2)  ! Doxycycline 3)  ! Vicodin  Past History:  Past Medical History: Reviewed history from 06/21/2008 and no changes required. [place] Spotted Fever 2008 GERD Depression  Past Surgical History: Reviewed history from 04/11/2008 and no changes required.  Tonsillectomy  Family History: Reviewed history from 06/21/2008 and no changes required. Family History Depression bipolar disorder  Social History: Reviewed history from 06/21/2008 and no changes required. Single Alcohol use-yes Drug use-no  Review of Systems  The patient denies anorexia, fever, weight loss, weight gain, vision loss, decreased hearing, hoarseness, chest pain, syncope, dyspnea on exertion, peripheral edema, prolonged cough, headaches, hemoptysis, abdominal pain, severe indigestion/heartburn,  hematuria, incontinence, genital sores, muscle weakness, suspicious skin lesions, transient blindness, difficulty walking, depression, unusual weight change, abnormal bleeding, enlarged lymph nodes, angioedema, breast masses, and testicular masses.    Physical Exam  General:  in a wheelchair, she appears weak and quite ill, she has trouble answering questions Head:  Normocephalic and atraumatic without obvious abnormalities. No apparent alopecia or balding. Eyes:  No corneal or conjunctival inflammation noted. EOMI. Perrla. Funduscopic exam benign, without hemorrhages, exudates or papilledema. Vision grossly normal. Ears:  External ear exam shows no significant lesions or deformities.  Otoscopic examination reveals clear canals, tympanic membranes are intact bilaterally without bulging, retraction, inflammation or discharge. Hearing is grossly normal bilaterally. Nose:  External nasal examination shows no deformity or inflammation. Nasal mucosa are pink and moist without lesions or exudates. Mouth:  Oral mucosa and oropharynx without lesions or exudates.  Teeth in good repair. Neck:  No deformities, masses, or tenderness noted. Lungs:  Normal respiratory effort, chest expands symmetrically. Lungs are clear to auscultation, no crackles or wheezes. Heart:  Normal rate and regular rhythm. S1 and S2 normal without gallop, murmur, click, rub or other extra sounds. Abdomen:  Bowel sounds positive,abdomen soft and non-tender without masses, organomegaly or hernias noted. Neurologic:  not very alert and somewhat disoriented. Cannot walk Psych:  flat affect, poor eye contact, and tearful.     Impression & Recommendations:  Problem # 1:  NAUSEA WITH VOMITING (ICD-787.01)  Problem # 2:  WEAKNESS (ICD-780.79)  Complete Medication List: 1)  Diclofenac Sodium 50 Mg Tbec (Diclofenac sodium) .... Three  times a day as needed for pain  Patient Instructions: 1)  She needs to be admitted for IV fluids and  for ufrther workup. Her friend will drive her to [facility] ER immediately for further evaluation and treatment.

## 2010-04-03 NOTE — Assessment & Plan Note (Signed)
Summary: SICK HEADACHES ANXIETY/PS   Vital Signs:  Patient profile:   25 year old female O2 Sat:      94 % Temp:     98.3 degrees F Pulse rate:   70 / minute BP sitting:   120 / 70  (left arm)  Vitals Entered By: Pura Spice, RN (December 25, 2009 3:37 PM) CC: headaches pressure    History of Present Illness: Here with continued HAs and complaints of pressure in her head. The Keflex has not helped at all. The steroid shot she had here last week helped for about 24 hours only. Her anxiety is under much better control now. She saw the PA at Dr. Loralie Champagne office last week, and they added Abilify to her meds. Of course, her labs and a head CT scan a few weeks ago at the ER was normal.   Allergies: 1)  ! Levaquin 2)  ! Doxycycline  Past History:  Past Medical History: Reviewed history from 11/29/2009 and no changes required. [place] Spotted Fever 2008 GERD Depression Anxiety  Past Surgical History: Reviewed history from 04/11/2008 and no changes required.  Tonsillectomy  Review of Systems  The patient denies anorexia, fever, weight loss, weight gain, vision loss, decreased hearing, hoarseness, chest pain, syncope, dyspnea on exertion, peripheral edema, prolonged cough, hemoptysis, abdominal pain, melena, hematochezia, severe indigestion/heartburn, hematuria, incontinence, genital sores, muscle weakness, suspicious skin lesions, transient blindness, difficulty walking, depression, unusual weight change, abnormal bleeding, enlarged lymph nodes, angioedema, breast masses, and testicular masses.    Physical Exam  General:  Well-developed,well-nourished,in no acute distress; alert,appropriate and cooperative throughout examination Head:  Normocephalic and atraumatic without obvious abnormalities. No apparent alopecia or balding. Eyes:  No corneal or conjunctival inflammation noted. EOMI. Perrla. Funduscopic exam benign, without hemorrhages, exudates or papilledema. Vision  grossly normal. Ears:  External ear exam shows no significant lesions or deformities.  Otoscopic examination reveals clear canals, tympanic membranes are intact bilaterally without bulging, retraction, inflammation or discharge. Hearing is grossly normal bilaterally. Nose:  External nasal examination shows no deformity or inflammation. Nasal mucosa are pink and moist without lesions or exudates. Mouth:  Oral mucosa and oropharynx without lesions or exudates.  Teeth in good repair. Neck:  No deformities, masses, or tenderness noted. Lungs:  Normal respiratory effort, chest expands symmetrically. Lungs are clear to auscultation, no crackles or wheezes. Heart:  Normal rate and regular rhythm. S1 and S2 normal without gallop, murmur, click, rub or other extra sounds. Neurologic:  No cranial nerve deficits noted. Station and gait are normal. Plantar reflexes are down-going bilaterally. DTRs are symmetrical throughout. Sensory, motor and coordinative functions appear intact. Psych:  Oriented X3, memory intact for recent and remote, normally interactive, good eye contact, and moderately anxious.     Impression & Recommendations:  Problem # 1:  HEADACHE (ICD-784.0)  Her updated medication list for this problem includes:    Diclofenac Sodium 50 Mg Tbec (Diclofenac sodium) .Marland Kitchen... Three times a day as needed for pain    Vicodin 5-500 Mg Tabs (Hydrocodone-acetaminophen) .Marland Kitchen... 1 q 6 hours as needed pain    Fioricet 50-325-40 Mg Tabs (Butalbital-apap-caffeine) .Marland Kitchen... 1 q 6 hours as needed for ha  Orders: Neurology Referral (Neuro)  Problem # 2:  ANXIETY (ICD-300.00)  Her updated medication list for this problem includes:    Alprazolam 1 Mg Tabs (Alprazolam) .Marland Kitchen... 1 q 6 hours as needed for anxiety    Prozac 40 Mg Caps (Fluoxetine hcl) ..... Once daily  Complete Medication  List: 1)  Diclofenac Sodium 50 Mg Tbec (Diclofenac sodium) .... Three times a day as needed for pain 2)  Alprazolam 1 Mg Tabs  (Alprazolam) .Marland Kitchen.. 1 q 6 hours as needed for anxiety 3)  Prozac 40 Mg Caps (Fluoxetine hcl) .... Once daily 4)  Vicodin 5-500 Mg Tabs (Hydrocodone-acetaminophen) .Marland Kitchen.. 1 q 6 hours as needed pain 5)  Keflex 500 Mg Caps (Cephalexin) .... Three times a day 6)  Abilify 2 Mg Tabs (Aripiprazole) .... Once daily 7)  Fioricet 50-325-40 Mg Tabs (Butalbital-apap-caffeine) .Marland Kitchen.. 1 q 6 hours as needed for ha  Patient Instructions: 1)  his is still consistent with a migraine type HA, possibly with an element of tension HA as well. We will try Fioricet, and will set her up with Neurology soon.  Prescriptions: FIORICET 50-325-40 MG TABS (BUTALBITAL-APAP-CAFFEINE) 1 q 6 hours as needed for HA  #60 x 0   Entered and Authorized by:   Nelwyn Salisbury MD   Signed by:   Nelwyn Salisbury MD on 12/25/2009   Method used:   Print then Give to Patient   RxID:   [id number]    Orders Added: 1)  Est. Patient Level IV [24097] 2)  Neurology Referral [Neuro]

## 2010-04-03 NOTE — Progress Notes (Signed)
Summary: lab results  Phone Note Call from Patient Call back at [phone]   Caller: Patient---triage vm Reason for Call: Lab or Test Results Summary of Call: wants lab results. Initial call taken by: Warnell Forester,  January 31, 2010 1:07 PM  Follow-up for Phone Call        see results Follow-up by: Nelwyn Salisbury MD,  January 31, 2010 3:03 PM  Additional Follow-up for Phone Call Additional follow up Details #1::        pt called  Additional Follow-up by: Pura Spice, RN,  January 31, 2010 4:29 PM

## 2010-04-03 NOTE — Assessment & Plan Note (Signed)
Summary: POST HOSP F/U (ANXIETY) // RS   Vital Signs:  Patient profile:   25 year old female Weight:      152 pounds O2 Sat:      98 % Temp:     98.5 degrees F Pulse rate:   79 / minute BP sitting:   110 / 74  (left arm)  Vitals Entered By: Pura Spice, RN (January 30, 2010 2:31 PM) CC: 7 wks preg sees dr Marcelle Overlie  went to er sat for dehydration saw her psychiatrist and prozac changed to zoloft and now on xanax wants MRI and due to spinal pain ? about spinal tap    History of Present Illness: Here to follow up on HAs, neck pain, and anxiety. She is now about [redacted] weeks pregnant, and she is seeing Dr. Marcelle Overlie for this. She has seen Dr. Loralie Champagne office for the anxiety, and she has seen Dr. Clarisse Gouge for the [facility]. Dr. Clarisse Gouge said it would be helpful to get a CT or a contrasted MRI of her head, but of course we cannot do this now because of the pregancy. Even a non-contrasted MRI would be difficult to obtain because she would need large amounts of sedation to lie still for it due to her panic attacks. Dr. Clarisse Gouge ordered her to start PT to the neck twice a week, but this will not start until later this week. She was told to take nothing but Tylenol for her HAs. She is on Xanax and Zoloft per the psychiatry office. She is very anxious today and asks me what she should do.   Allergies: 1)  ! Levaquin 2)  ! Doxycycline  Past History:  Past Medical History: Reviewed history from 01/10/2010 and no changes required. [place] Spotted Fever 2008 GERD Depression Anxiety, sees Velta Addison PA at Dr. Loralie [facility]agne office sees Dr. Marcelle Overlie for ObGyn care  Review of Systems  The patient denies anorexia, fever, weight loss, weight gain, vision loss, decreased hearing, hoarseness, chest pain, syncope, dyspnea on exertion, peripheral edema, prolonged cough, hemoptysis, abdominal pain, melena, hematochezia, severe indigestion/heartburn, hematuria, incontinence, genital sores, muscle weakness, suspicious  skin lesions, transient blindness, difficulty walking, depression, unusual weight change, abnormal bleeding, enlarged lymph nodes, angioedema, breast masses, and testicular masses.    Physical Exam  General:  Well-developed,well-nourished,in no acute distress; alert,appropriate and cooperative throughout examination Neck:  No deformities, masses, or tenderness noted. Lungs:  Normal respiratory effort, chest expands symmetrically. Lungs are clear to auscultation, no crackles or wheezes. Heart:  Normal rate and regular rhythm. S1 and S2 normal without gallop, murmur, click, rub or other extra sounds. Neurologic:  No cranial nerve deficits noted. Station and gait are normal. Plantar reflexes are down-going bilaterally. DTRs are symmetrical throughout. Sensory, motor and coordinative functions appear intact. Psych:  Oriented X3, memory intact for recent and remote, normally interactive, good eye contact, and moderately anxious.     Impression & Recommendations:  Problem # 1:  HEADACHE (ICD-784.0)  The following medications were removed from the medication list:    Diclofenac Sodium 50 Mg Tbec (Diclofenac sodium) .Marland Kitchen... Three times a day as needed for pain    Vicodin 5-500 Mg Tabs (Hydrocodone-acetaminophen) .Marland Kitchen... 1 q 6 hours as needed pain Her updated medication list for this problem includes:    Fioricet 50-325-40 Mg Tabs (Butalbital-apap-caffeine) .Marland Kitchen... 1 q 6 hours as needed for ha  Orders: Venipuncture (16109) TLB-TSH (Thyroid Stimulating Hormone) (84443-TSH) TLB-CBC Platelet - w/Differential (85025-CBCD) TLB-Sedimentation Rate (ESR) (85652-ESR) TLB-T4 (Thyrox), Free (  87564-PP2R) TLB-T3, Free (Triiodothyronine) (84481-T3FREE) Specimen Handling (51884)  Problem # 2:  PREGNANCY (ICD-V22.2)  Orders: Specimen Handling (16606)  Problem # 3:  ANXIETY (ICD-300.00)  The following medications were removed from the medication list:    Alprazolam 1 Mg Tabs (Alprazolam) .Marland Kitchen... 1 q 6 hours as  needed for anxiety    Prozac 40 Mg Caps (Fluoxetine hcl) ..... Once daily Her updated medication list for this problem includes:    Zoloft 50 Mg Tabs (Sertraline hcl) ..... Once daily    Alprazolam 0.25 Mg Tabs (Alprazolam) .Marland Kitchen... 1/2 tab two times a day  Orders: Specimen Handling (30160)  Complete Medication List: 1)  Fioricet 50-325-40 Mg Tabs (Butalbital-apap-caffeine) .Marland Kitchen.. 1 q 6 hours as needed for ha 2)  Zoloft 50 Mg Tabs (Sertraline hcl) .... Once daily 3)  Alprazolam 0.25 Mg Tabs (Alprazolam) .... 1/2 tab two times a day  Patient Instructions: 1)  She has had some labwork done recently but I don't see where her thyroid levels have been checked. We will get some labs today. I advised her to start the PT since this is safe and may be effective for the HAs. otherwise she needs to follow up closely with the specialists above.    Orders Added: 1)  Venipuncture [36415] 2)  TLB-TSH (Thyroid Stimulating Hormone) [84443-TSH] 3)  TLB-CBC Platelet - w/Differential [85025-CBCD] 4)  TLB-Sedimentation Rate (ESR) [85652-ESR] 5)  TLB-T4 (Thyrox), Free [10932-TF5D] 6)  TLB-T3, Free (Triiodothyronine) [32202-R4YHCW] 7)  Est. Patient Level IV [23762] 8)  Specimen Handling [99000]

## 2010-04-03 NOTE — Letter (Signed)
Summary: [facility], Flu Symptoms  [facility], Flu Symptoms   Imported By: Maryln Gottron 09/14/2009 14:46:13  _____________________________________________________________________  External Attachment:    Type:   Image     Comment:   External Document

## 2010-04-03 NOTE — Progress Notes (Signed)
Summary: cough RX  Phone Note Call from Patient   Caller: Patient Call For: Nelwyn Salisbury MD Summary of Call: Pt need RX for cough called to [facility]. [phone] Initial call taken by: Lynann Beaver CMA,  April 18, 2009 11:33 AM  Follow-up for Phone Call        call in Hydromet 1 tsp q 4 hours as needed cough, 240 ml, no rf Follow-up by: Nelwyn Salisbury MD,  April 18, 2009 1:09 PM  Additional Follow-up for Phone Call Additional follow up Details #1::        Phone Call Completed, Rx Called In Additional Follow-up by: Alfred Levins, CMA,  April 18, 2009 1:40 PM    New/Updated Medications: HYDROMET 5-1.5 MG/5ML SYRP (HYDROCODONE-HOMATROPINE) 1 tsp every 4 hours as needed for cough Prescriptions: HYDROMET 5-1.5 MG/5ML SYRP (HYDROCODONE-HOMATROPINE) 1 tsp every 4 hours as needed for cough  #215mL x 0   Entered by:   Alfred Levins, CMA   Authorized by:   Nelwyn Salisbury MD   Signed by:   Alfred Levins, CMA on 04/18/2009   Method used:   Printed then faxed to ...       [facility]* (retail)       [address]       [address] Shipping/mailing       [place], [place]  [address]       Ph: [phone]       Fax: [phone]   RxID:   [id number]

## 2010-04-05 ENCOUNTER — Ambulatory Visit (HOSPITAL_COMMUNITY)
Admission: RE | Admit: 2010-04-05 | Discharge: 2010-04-05 | Disposition: A | Discharge status: Home / Self Care | Payer: Commercial Managed Care - PPO | Source: Ambulatory Visit | Attending: Neurology | Admitting: Neurology

## 2010-04-05 DIAGNOSIS — Z1389 Encounter for screening for other disorder: Secondary | ICD-10-CM | POA: Insufficient documentation

## 2010-04-05 DIAGNOSIS — R569 Unspecified convulsions: Secondary | ICD-10-CM | POA: Insufficient documentation

## 2010-04-05 NOTE — Progress Notes (Signed)
Summary: nos appt  Phone Note Call from Patient   Caller: [email or web address]  Call For: clance Summary of Call: In ref to nos from 12/19, pt states she will call to rsc. Initial call taken by: Darletta Moll,  February 20, 2010 3:46 PM

## 2010-04-05 NOTE — Assessment & Plan Note (Signed)
Summary: F/U FROM URGENT CARE HAD SEIZURE//SLM   Vital Signs:  Patient profile:   25 year old female Weight:      141 pounds Temp:     97.0 degrees F axillary BP sitting:   90 / 60  (left arm) Cuff size:   regular  Vitals Entered By: Sid Falcon LPN (March 08, 2010 10:17 AM)  History of Present Illness: Refer to prior note. Pt has had months of multiple symptoms including progressive headaches, fatigue, nausea without vomiting, weight loss among others.  Seen by multiple specialists.  This past friday reported ?tonic clonic seizure witnessed by a couple of friends who are  not here today to describe.  Reprtedly lasted about 15 minutes.  Pt taken to [facility] and  labs unremarkable and MRI no acute findings.  She has f/u tomorrow with Dr Clarisse Gouge (headache specialist) and in March with another neurologist.  No reported seizure activity since Friday. Not driving. No fever.  No recent head injury.  No prior hx of seizure.  Pt reports new symptoms today of some dysphagia to liquids and solids intermittently. Poor appetite and continued weight loss.  NO recent anemia and sed rate normal.  No hx of PUD.  Recent labs Pos Lyme ab screen but Western Blot pending.  No recent tick bite hx. Positive EBV antibodies- ?subacute vs remote infection.  Allergies: 1)  ! Levaquin 2)  ! Doxycycline  Past History:  Past Medical History: Last updated: 01/10/2010 [facility] Spotted Fever 2008 GERD Depression Anxiety, sees Velta Addison PA at Dr. Loralie Champagne office sees Dr. Marcelle Overlie for ObGyn care  Past Surgical History: Last updated: 04/11/2008  Tonsillectomy  Family History: Last updated: 06/21/2008 Family History Depression bipolar disorder  Social History: Last updated: 06/21/2008 Single Alcohol use-yes Drug use-no  Risk Factors: Smoking Status: current (12/13/2009) PMH-FH-SH reviewed for relevance  Review of Systems       The patient complains of anorexia, weight  loss, and headaches.  The patient denies fever, decreased hearing, hoarseness, chest pain, syncope, peripheral edema, prolonged cough, hemoptysis, melena, hematochezia, severe indigestion/heartburn, and enlarged lymph nodes.    Physical Exam  General:  Well-developed,well-nourished,in no acute distress; alert,appropriate and cooperative throughout examination Head:  normocephalic and atraumatic.   Ears:  External ear exam shows no significant lesions or deformities.  Otoscopic examination reveals clear canals, tympanic membranes are intact bilaterally without bulging, retraction, inflammation or discharge. Hearing is grossly normal bilaterally. Mouth:  Oral mucosa and oropharynx without lesions or exudates.  Teeth in good repair. Neck:  No deformities, masses, or tenderness noted. Lungs:  Normal respiratory effort, chest expands symmetrically. Lungs are clear to auscultation, no crackles or wheezes. Heart:  normal rate and regular rhythm.   Abdomen:  Bowel sounds positive,abdomen soft and non-tender without masses, organomegaly or hernias noted. Extremities:  No clubbing, cyanosis, edema, or deformity noted with normal full range of motion of all joints.   Neurologic:  alert & oriented X3, cranial nerves II-XII intact, and strength normal in all extremities.   Skin:  no rashes and no suspicious lesions.   Cervical Nodes:  No lymphadenopathy noted Psych:  moderately anxious.     Impression & Recommendations:  Problem # 1:  HEADACHE (ICD-784.0) follow up with neurologist tomorrow. Her updated medication list for this problem includes:    Fioricet 50-325-40 Mg Tabs (Butalbital-apap-caffeine) .Marland Kitchen... 1 q 6 hours as needed for ha  Problem # 2:  WEIGHT LOSS (ICD-783.21) ?etiology.  Organic vs nonorganic etiology She  is encouraged to cont close f/u with Dr Clent Ridges.  Problem # 3:  OTHER DYSPHAGIA (ICD-787.29) /consider UGI in setting of unexplained weight loss.  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  pt needs EEG to further evaluate.  ?seizure vs pseudoseizure.  Problem # 5:  ANXIETY (ICD-300.00) Query whether most of her recent symptoms related.  Pt continues to see PA with pschiatry group. Pt had recent pregnancy and abortion but denies this is signif stress. Her updated medication list for this problem includes:    Alprazolam 0.25 Mg Tabs (Alprazolam) .Marland Kitchen..Marland Kitchen Two times a day    Prozac 20 Mg Caps (Fluoxetine hcl) ..... Once daily  Complete Medication List: 1)  Fioricet 50-325-40 Mg Tabs (Butalbital-apap-caffeine) .Marland Kitchen.. 1 q 6 hours as needed for ha 2)  Alprazolam 0.25 Mg Tabs (Alprazolam) .... Two times a day 3)  Prozac 20 Mg Caps (Fluoxetine hcl) .... Once daily  Patient Instructions: 1)  We will call you regarding appointments with neurology and UGI.   Orders Added: 1)  Est. Patient Level IV [16109]

## 2010-04-05 NOTE — Letter (Signed)
Summary: [facility]  [facility]   Imported By: Maryln Gottron 03/20/2010 10:27:36  _____________________________________________________________________  External Attachment:    Type:   Image     Comment:   External Document

## 2010-04-05 NOTE — Assessment & Plan Note (Signed)
Summary: head pressure/loss of appetite/not sleeping/cjr   Vital Signs:  Patient profile:   25 year old female Weight:      142 pounds Temp:     97.9 degrees F oral BP sitting:   104 / 70  (left arm) Cuff size:   regular  Vitals Entered By: Sid Falcon LPN (March 01, 2010 1:49 PM)  History of Present Illness: Pt seen with 4 month hx of multiple somatic complaints including but not limited to headaches, poor sleep, appetite loss, anxiety, weight loss, fatigue.  Has had multiple ER visits and has seen multiple specialists.  ER at [facility] last night with labs signif for K 3.2, otherwise relatively normal.  Ongoing occipital headaches with normal CT head 9/11.  recent pos preg with abortion 3 days ago on  Monday.  sees phychiatrist and treated with Prozac.  Denies increased depressive symoptoms. Has seen headache specialist.   Headaches described as increased "pressure" and occipital location with no signif radiation and constant.  No alleviating features.  Fiorinal, opioids,reglan, and toradol without relief.  No consistent aggravating factors.   She denies any specific stressors.  She dates onset of symptoms to sometime in September.  When  questioned specifically about her recent pregnancy she denies feeling stressed out about that.  She recalls this starting as "flu-like illness" in Sept. Denies fever, rash, abd pain, localized/focal weakness.  Recent office labs-ESR, thyroid, CBC reviewed.  Allergies: 1)  ! Levaquin 2)  ! Doxycycline  Past History:  Past Medical History: Last updated: 01/10/2010 [facility] Spotted Fever 2008 GERD Depression Anxiety, sees Velta Addison PA at Dr. Loralie Champagne office sees Dr. Marcelle Overlie for ObGyn care  Past Surgical History: Last updated: 04/11/2008  Tonsillectomy  Family History: Last updated: 06/21/2008 Family History Depression bipolar disorder  Social History: Last updated: 06/21/2008 Single Alcohol use-yes Drug  use-no  Risk Factors: Smoking Status: current (12/13/2009) PMH-FH-SH reviewed for relevance  Review of Systems       The patient complains of anorexia, weight loss, dyspnea on exertion, and headaches.  The patient denies fever, decreased hearing, hoarseness, chest pain, syncope, peripheral edema, prolonged cough, hemoptysis, abdominal pain, melena, hematochezia, severe indigestion/heartburn, hematuria, incontinence, suspicious skin lesions, difficulty walking, and enlarged lymph nodes.    Physical Exam  General:  pt is alert and cooperative and slightly anxious in appearance. Head:  normocephalic, atraumatic, and no abnormalities observed.   Eyes:  pupils equal, pupils round, and pupils reactive to light.   Ears:  External ear exam shows no significant lesions or deformities.  Otoscopic examination reveals clear canals, tympanic membranes are intact bilaterally without bulging, retraction, inflammation or discharge. Hearing is grossly normal bilaterally. Mouth:  Oral mucosa and oropharynx without lesions or exudates.  Teeth in good repair. Neck:  No deformities, masses, or tenderness noted. Lungs:  Normal respiratory effort, chest expands symmetrically. Lungs are clear to auscultation, no crackles or wheezes. Heart:  normal rate, regular rhythm, and no murmur.   Abdomen:  soft and non-tender.   Extremities:  No clubbing, cyanosis, edema, or deformity noted with normal full range of motion of all joints.   Neurologic:  alert & oriented X3, cranial nerves II-XII intact, and strength normal in all extremities.   Skin:  no rashes and no suspicious lesions.   Cervical Nodes:  No lymphadenopathy noted Psych:  Oriented X3, good eye contact, not depressed appearing, and slightly anxious.     Impression & Recommendations:  Problem # 1:  HEADACHE (ICD-784.0) atypical, ?chronic  tension type related to poor sleep.  She is encouraged to f/u with Dr Clent Ridges to discuss possible prophylactics. Her  updated medication list for this problem includes:    Fioricet 50-325-40 Mg Tabs (Butalbital-apap-caffeine) .Marland Kitchen... 1 q 6 hours as needed for ha  Problem # 2:  ANXIETY (ICD-300.00) cont f/u wtih psychiatry. Her updated medication list for this problem includes:    Alprazolam 0.25 Mg Tabs (Alprazolam) .Marland Kitchen..Marland Kitchen Two times a day    Prozac 20 Mg Caps (Fluoxetine hcl) ..... Once daily  Problem # 3:  WEAKNESS (ICD-780.79) pt requests lyme and EBV titers .  We have explained limitations of testing, esp false positives and lack of specificity in areas of low prevalence. Orders: T-Lyme Disease [phone]) T- * Misc. Laboratory test [phone]) Specimen Handling (03474) Venipuncture (25956)  Problem # 4:  WEIGHT LOSS (ICD-783.21)  Orders: T- * Misc. Laboratory test [phone]) Specimen Handling (43329) Venipuncture (51884)  Problem # 5:  SLEEP DISORDER (ICD-780.50)  Problem # 6:  PREGNANCY (ICD-V22.2) recent abortion just 4 days ago.  No fever or pelvic pain.  Complete Medication List: 1)  Fioricet 50-325-40 Mg Tabs (Butalbital-apap-caffeine) .Marland Kitchen.. 1 q 6 hours as needed for ha 2)  Alprazolam 0.25 Mg Tabs (Alprazolam) .... Two times a day 3)  Prozac 20 Mg Caps (Fluoxetine hcl) .... Once daily   Orders Added: 1)  T-Lyme Disease [16606-30160] 2)  T- * Misc. Laboratory test [99999] 3)  Specimen Handling [99000] 4)  Venipuncture [36415] 5)  Est. Patient Level IV [10932]

## 2010-04-05 NOTE — Assessment & Plan Note (Signed)
Summary: sore throat/congestion/cjr   Vital Signs:  Patient profile:   25 year old female Height:      63 inches Weight:      147 pounds BMI:     26.13 Temp:     98.4 degrees F oral BP sitting:   98 / 64  (left arm) Cuff size:   regular  Vitals Entered By: Alfred Levins, CMA (February 16, 2010 3:18 PM) CC: sob, eyes hurt, congestion, fatigue, h/a, ear pressure, stuffy nose x2-3 wks, black mold found in her home   History of Present Illness: Here with her father for several weeks of sinus pressure, HA, PND, and lack of appetite. No vomitting. She is [redacted] weeks pregnant. She has seen the OBGYN and things seem to be going well.   Current Medications (verified): 1)  Fioricet 50-325-40 Mg Tabs (Butalbital-Apap-Caffeine) .Marland Kitchen.. 1 Q 6 Hours As Needed For Ha 2)  Zoloft 50 Mg Tabs (Sertraline Hcl) .... Once Daily 3)  Alprazolam 0.25 Mg Tabs (Alprazolam) .... 1/2 Tab Two Times A Day  Allergies (verified): 1)  ! Levaquin 2)  ! Doxycycline  Past History:  Past Medical History: Reviewed history from 01/10/2010 and no changes required. [place] Spotted Fever 2008 GERD Depression Anxiety, sees Velta Addison PA at Dr. Loralie Champagne office sees Dr. Marcelle Overlie for ObGyn care  Review of Systems  The patient denies anorexia, fever, weight loss, weight gain, vision loss, decreased hearing, hoarseness, chest pain, syncope, dyspnea on exertion, peripheral edema, hemoptysis, abdominal pain, melena, hematochezia, severe indigestion/heartburn, hematuria, incontinence, genital sores, muscle weakness, suspicious skin lesions, transient blindness, difficulty walking, depression, unusual weight change, abnormal bleeding, enlarged lymph nodes, angioedema, breast masses, and testicular masses.    Physical Exam  General:  Well-developed,well-nourished,in no acute distress; alert,appropriate and cooperative throughout examination Head:  Normocephalic and atraumatic without obvious abnormalities. No apparent  alopecia or balding. Eyes:  No corneal or conjunctival inflammation noted. EOMI. Perrla. Funduscopic exam benign, without hemorrhages, exudates or papilledema. Vision grossly normal. Ears:  External ear exam shows no significant lesions or deformities.  Otoscopic examination reveals clear canals, tympanic membranes are intact bilaterally without bulging, retraction, inflammation or discharge. Hearing is grossly normal bilaterally. Nose:  External nasal examination shows no deformity or inflammation. Nasal mucosa are pink and moist without lesions or exudates. Mouth:  Oral mucosa and oropharynx without lesions or exudates.  Teeth in good repair. Neck:  No deformities, masses, or tenderness noted. Lungs:  Normal respiratory effort, chest expands symmetrically. Lungs are clear to auscultation, no crackles or wheezes.   Impression & Recommendations:  Problem # 1:  ACUTE SINUSITIS, UNSPECIFIED (ICD-461.9)  Her updated medication list for this problem includes:    Amoxicillin 500 Mg Caps (Amoxicillin) .Marland Kitchen... Three times a day  Complete Medication List: 1)  Fioricet 50-325-40 Mg Tabs (Butalbital-apap-caffeine) .Marland Kitchen.. 1 q 6 hours as needed for ha 2)  Alprazolam 0.25 Mg Tabs (Alprazolam) .... Two times a day 3)  Prozac 20 Mg Caps (Fluoxetine hcl) .... Once daily 4)  Amoxicillin 500 Mg Caps (Amoxicillin) .... Three times a day  Patient Instructions: 1)  Please schedule a follow-up appointment as needed .  Prescriptions: AMOXICILLIN 500 MG CAPS (AMOXICILLIN) three times a day  #30 x 0   Entered and Authorized by:   Nelwyn Salisbury MD   Signed by:   Nelwyn Salisbury MD on 02/16/2010   Method used:   Electronically to        [facility]* (retail)  [address]       [address] Shipping/mailing       [place], [place]  [address]       Ph: [phone]       Fax: [phone]   RxID:   [id number]    Orders Added: 1)  Est. Patient Level IV [84132]

## 2010-04-05 NOTE — Progress Notes (Signed)
Summary: lab results  Phone Note Call from Patient Call back at Home Phone [phone]   Caller: Patient Call For: Nelwyn Salisbury MD Summary of Call: pt is return gina call needs lab results Initial call taken by: Heron Sabins,  March 13, 2010 12:48 PM  Follow-up for Phone Call        pt notified. resutls lab given  Follow-up by: Pura Spice, RN,  March 14, 2010 8:34 AM

## 2010-04-14 ENCOUNTER — Emergency Department (HOSPITAL_COMMUNITY)
Admission: EM | Admit: 2010-04-14 | Discharge: 2010-04-16 | Disposition: A | Discharge status: Other Acute Inpatient Hospital | Payer: 59 | Attending: Emergency Medicine | Admitting: Emergency Medicine

## 2010-04-14 DIAGNOSIS — S61509A Unspecified open wound of unspecified wrist, initial encounter: Secondary | ICD-10-CM | POA: Insufficient documentation

## 2010-04-14 DIAGNOSIS — F329 Major depressive disorder, single episode, unspecified: Secondary | ICD-10-CM | POA: Insufficient documentation

## 2010-04-14 DIAGNOSIS — F3289 Other specified depressive episodes: Secondary | ICD-10-CM | POA: Insufficient documentation

## 2010-04-14 DIAGNOSIS — X789XXA Intentional self-harm by unspecified sharp object, initial encounter: Secondary | ICD-10-CM | POA: Insufficient documentation

## 2010-04-15 DIAGNOSIS — F39 Unspecified mood [affective] disorder: Secondary | ICD-10-CM

## 2010-04-15 LAB — RAPID URINE DRUG SCREEN, HOSP PERFORMED: Tetrahydrocannabinol: NOT DETECTED

## 2010-04-15 LAB — ETHANOL: Alcohol, Ethyl (B): <5 mg/dL (ref 0–10)

## 2010-04-15 LAB — CBC
HCT: 39.3 % (ref 36.0–46.0)
MCHC: 34.6 g/dL (ref 30.0–36.0)
MCV: 92.3 fL (ref 78.0–100.0)
RDW: 12.5 % (ref 11.5–15.5)

## 2010-04-15 LAB — BASIC METABOLIC PANEL
CO2: 23 mEq/L (ref 19–32)
Chloride: 108 mEq/L (ref 96–112)
Creatinine, Ser: 0.75 mg/dL (ref 0.4–1.2)
GFR calc Af Amer: >60 mL/min (ref >60)
Potassium: 3.5 mEq/L (ref 3.5–5.1)
Sodium: 139 mEq/L (ref 135–145)

## 2010-04-15 LAB — HEPATIC FUNCTION PANEL
AST: 16 U/L (ref 0–37)
Albumin: 4 g/dL (ref 3.5–5.2)
Total Bilirubin: 0.7 mg/dL (ref 0.3–1.2)
Total Protein: 6.5 g/dL (ref 6.0–8.3)

## 2010-04-15 LAB — DIFFERENTIAL
Eosinophils Relative: 1 % (ref 0–5)
Lymphocytes Relative: 29 % (ref 12–46)
Lymphs Abs: 3.3 10*3/uL (ref 0.7–4.0)
Monocytes Absolute: 1 10*3/uL (ref 0.1–1.0)
Monocytes Relative: 9 % (ref 3–12)

## 2010-04-15 LAB — VALPROIC ACID LEVEL
Valproic Acid Lvl: <10 ug/mL — ABNORMAL LOW (ref 50.0–100.0)
Valproic Acid Lvl: <10 ug/mL — ABNORMAL LOW (ref 50.0–100.0)

## 2010-04-15 LAB — ACETAMINOPHEN LEVEL: Acetaminophen (Tylenol), Serum: <10 ug/mL — ABNORMAL LOW (ref 10–30)

## 2010-04-16 ENCOUNTER — Inpatient Hospital Stay (HOSPITAL_COMMUNITY)
Admission: AD | Admit: 2010-04-16 | Discharge: 2010-04-23 | DRG: 885 | Disposition: A | Discharge status: Home / Self Care | Payer: 59 | Source: Ambulatory Visit | Attending: Psychiatry | Admitting: Psychiatry

## 2010-04-16 DIAGNOSIS — T6592XA Toxic effect of unspecified substance, intentional self-harm, initial encounter: Secondary | ICD-10-CM

## 2010-04-16 DIAGNOSIS — T510X4A Toxic effect of ethanol, undetermined, initial encounter: Secondary | ICD-10-CM

## 2010-04-16 DIAGNOSIS — IMO0001 Reserved for inherently not codable concepts without codable children: Secondary | ICD-10-CM

## 2010-04-16 DIAGNOSIS — F39 Unspecified mood [affective] disorder: Principal | ICD-10-CM

## 2010-04-16 DIAGNOSIS — R5382 Chronic fatigue, unspecified: Secondary | ICD-10-CM

## 2010-04-16 DIAGNOSIS — F1210 Cannabis abuse, uncomplicated: Secondary | ICD-10-CM

## 2010-04-16 DIAGNOSIS — T424X4A Poisoning by benzodiazepines, undetermined, initial encounter: Secondary | ICD-10-CM

## 2010-04-16 DIAGNOSIS — T43502A Poisoning by unspecified antipsychotics and neuroleptics, intentional self-harm, initial encounter: Secondary | ICD-10-CM

## 2010-04-16 DIAGNOSIS — IMO0002 Reserved for concepts with insufficient information to code with codable children: Secondary | ICD-10-CM

## 2010-04-16 DIAGNOSIS — F411 Generalized anxiety disorder: Secondary | ICD-10-CM

## 2010-04-16 DIAGNOSIS — G9332 Myalgic encephalomyelitis/chronic fatigue syndrome: Secondary | ICD-10-CM

## 2010-04-16 DIAGNOSIS — F410 Panic disorder [episodic paroxysmal anxiety] without agoraphobia: Secondary | ICD-10-CM

## 2010-04-16 DIAGNOSIS — F17200 Nicotine dependence, unspecified, uncomplicated: Secondary | ICD-10-CM

## 2010-04-17 DIAGNOSIS — F39 Unspecified mood [affective] disorder: Secondary | ICD-10-CM

## 2010-04-17 DIAGNOSIS — F410 Panic disorder [episodic paroxysmal anxiety] without agoraphobia: Secondary | ICD-10-CM

## 2010-04-17 DIAGNOSIS — F411 Generalized anxiety disorder: Secondary | ICD-10-CM
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
## 2010-04-18 LAB — ANA: Anti Nuclear Antibody(ANA): NEGATIVE

## 2010-04-18 LAB — FOLATE: Folate: 19.2 ng/mL

## 2010-04-18 LAB — VITAMIN B12: Vitamin B-12: 852 pg/mL (ref 211–911)

## 2010-04-19 LAB — URINALYSIS, ROUTINE W REFLEX MICROSCOPIC
Bilirubin Urine: NEGATIVE
Hgb urine dipstick: NEGATIVE
Protein, ur: NEGATIVE mg/dL
Urine Glucose, Fasting: NEGATIVE mg/dL
Urobilinogen, UA: 0.2 mg/dL (ref 0.0–1.0)

## 2010-04-19 LAB — URINE MICROSCOPIC-ADD ON

## 2010-05-14 LAB — DIFFERENTIAL
Basophils Absolute: 0 10*3/uL (ref 0.0–0.1)
Basophils Relative: 0 % (ref 0–1)
Basophils Relative: 0 % (ref 0–1)
Eosinophils Absolute: 0.1 10*3/uL (ref 0.0–0.7)
Eosinophils Relative: 1 % (ref 0–5)
Monocytes Absolute: 0.6 10*3/uL (ref 0.1–1.0)
Monocytes Relative: 7 % (ref 3–12)
Neutro Abs: 6.3 10*3/uL (ref 1.7–7.7)
Neutro Abs: 8.2 10*3/uL — ABNORMAL HIGH (ref 1.7–7.7)
Neutrophils Relative %: 66 % (ref 43–77)

## 2010-05-14 LAB — URINALYSIS, ROUTINE W REFLEX MICROSCOPIC
Glucose, UA: NEGATIVE mg/dL
Ketones, ur: 40 mg/dL — AB
Leukocytes, UA: NEGATIVE
Nitrite: NEGATIVE
Protein, ur: NEGATIVE mg/dL
Protein, ur: 30 mg/dL — AB
pH: 6.5 (ref 5.0–8.0)

## 2010-05-14 LAB — CBC
HCT: 36 % (ref 36.0–46.0)
Hemoglobin: 12.5 g/dL (ref 12.0–15.0)
Hemoglobin: 14.4 g/dL (ref 12.0–15.0)
MCH: 31.6 pg (ref 26.0–34.0)
MCHC: 34.7 g/dL (ref 30.0–36.0)
MCHC: 35.5 g/dL (ref 30.0–36.0)
MCV: 90.9 fL (ref 78.0–100.0)
RDW: 12.2 % (ref 11.5–15.5)

## 2010-05-14 LAB — URINE CULTURE: Colony Count: 7000

## 2010-05-14 LAB — RAPID URINE DRUG SCREEN, HOSP PERFORMED
Barbiturates: NOT DETECTED
Cocaine: NOT DETECTED
Opiates: POSITIVE — AB

## 2010-05-14 LAB — URINE MICROSCOPIC-ADD ON

## 2010-05-14 LAB — BASIC METABOLIC PANEL
CO2: 24 mEq/L (ref 19–32)
Chloride: 110 mEq/L (ref 96–112)
Glucose, Bld: 96 mg/dL (ref 70–99)
Potassium: 3.5 mEq/L (ref 3.5–5.1)
Sodium: 142 mEq/L (ref 135–145)

## 2010-05-14 LAB — POCT I-STAT, CHEM 8
Chloride: 105 mEq/L (ref 96–112)
HCT: 42 % (ref 36.0–46.0)
Potassium: 3.2 mEq/L — ABNORMAL LOW (ref 3.5–5.1)

## 2010-05-15 LAB — URINALYSIS, ROUTINE W REFLEX MICROSCOPIC
Bilirubin Urine: NEGATIVE
Glucose, UA: NEGATIVE mg/dL
Hgb urine dipstick: NEGATIVE
Ketones, ur: 15 mg/dL — AB
Ketones, ur: NEGATIVE mg/dL
Nitrite: NEGATIVE
Protein, ur: NEGATIVE mg/dL
Protein, ur: NEGATIVE mg/dL
Urobilinogen, UA: 0.2 mg/dL (ref 0.0–1.0)
Urobilinogen, UA: 0.2 mg/dL (ref 0.0–1.0)

## 2010-05-15 LAB — POCT I-STAT, CHEM 8
BUN: 5 mg/dL — ABNORMAL LOW (ref 6–23)
Calcium, Ion: 1.2 mmol/L (ref 1.12–1.32)
Chloride: 104 mEq/L (ref 96–112)
Chloride: 108 mEq/L (ref 96–112)
Creatinine, Ser: 0.7 mg/dL (ref 0.4–1.2)
Glucose, Bld: 89 mg/dL (ref 70–99)
Glucose, Bld: 90 mg/dL (ref 70–99)
HCT: 46 % (ref 36.0–46.0)
Hemoglobin: 15.3 g/dL — ABNORMAL HIGH (ref 12.0–15.0)
Hemoglobin: 15.6 g/dL — ABNORMAL HIGH (ref 12.0–15.0)
Potassium: 4 mEq/L (ref 3.5–5.1)
Sodium: 139 mEq/L (ref 135–145)

## 2010-05-15 LAB — RPR: RPR Ser Ql: NONREACTIVE

## 2010-05-15 LAB — POCT PREGNANCY, URINE: Preg Test, Ur: POSITIVE

## 2010-05-15 LAB — WET PREP, GENITAL

## 2010-05-15 LAB — GC/CHLAMYDIA PROBE AMP, GENITAL
Chlamydia, DNA Probe: NEGATIVE
GC Probe Amp, Genital: NEGATIVE

## 2010-05-16 LAB — CBC
HCT: 39.2 % (ref 36.0–46.0)
Hemoglobin: 13.6 g/dL (ref 12.0–15.0)
MCH: 32.4 pg (ref 26.0–34.0)
RBC: 4.2 MIL/uL (ref 3.87–5.11)

## 2010-05-16 LAB — POCT I-STAT, CHEM 8
BUN: 4 mg/dL — ABNORMAL LOW (ref 6–23)
Creatinine, Ser: 0.8 mg/dL (ref 0.4–1.2)
Glucose, Bld: 84 mg/dL (ref 70–99)
Potassium: 3.6 mEq/L (ref 3.5–5.1)
Sodium: 141 mEq/L (ref 135–145)
TCO2: 24 mmol/L (ref 0–100)

## 2010-05-16 LAB — DIFFERENTIAL
Eosinophils Absolute: 0.3 10*3/uL (ref 0.0–0.7)
Lymphs Abs: 2 10*3/uL (ref 0.7–4.0)
Monocytes Absolute: 0.9 10*3/uL (ref 0.1–1.0)
Monocytes Relative: 9 % (ref 3–12)
Neutrophils Relative %: 70 % (ref 43–77)

## 2010-05-16 NOTE — Discharge Summary (Signed)
[name], [name]:  [id number]  MEDICAL RECORD NO.:  [id number]           PATIENT TYPE:  I  LOCATION:  0507                          [facility]ILITY[facility]  [facility]  PHYSICIAN:  Marlis Edelson, DO        DATE OF BIRTH:  28-Aug-1985  DATE OF ADMISSION:  04/16/2010 DATE OF DISCHARGE:  04/23/2010                              DISCHARGE SUMMARY   REASON FOR ADMISSION:  This is a 25 year old female who was admitted after being evaluated in the emergency room with symptoms of depression and 2 suicide attempts.  She related that on Friday and Saturday night prior to admission she attempted to take her life by overdosing on alcohol and Xanax and a second time cutting her wrists.  FINAL DIAGNOSES:  AXIS I:  Mood disorder NOS, rule out bipolar disorder, rule out major depressive disorder, chronic recurrent.  Generalized anxiety disorder, panic disorder and history of marijuana abuse. AXIS II:  Deferred. AXIS III:  Past medical history of fibromyalgia and chronic fatigue, history of polycystic ovarian syndrome, recurrent bronchitis. AXIS IV:  Medical problems. AXIS V:  GAF 50-55.  LABORATORY DATA:  TSH of 0.645.  Folate level of 19.2.  Negative urine pregnancy.  SIGNIFICANT FINDINGS:  This is a single female, thin but well developed and well nourished in no acute distress, unkempt in appearance.  Eye contact was intermittent, although it did improve towards the end of the interview.  Her speech is clear and coherent, somewhat monotone.  Her insight was fair.  Thought content was unremarkable.  No current homicidal ideation and no psychotic symptoms.  The patient was integrated into the adult unit and group therapy. We discussed smoking cessation, talking about nicotine and caffeine consumption exacerbating her anxiety and panic disorder.  We discontinued her Prozac, discontinued her Depakote.  The patient was initially having some difficulty with distinguishing reality.  She was  feeling tired with some nausea and vomiting and was just talking about being tired.  We did more blood work.  Her ANA was negative.  Her vitamin B12 was 857.  We tried Rozerem for sleep but discontinued it due to lack of efficacy.  We initiated Restoril and Cymbalta for her depressive symptoms.  We had contact with the patient's step-mother for concerns and safety issues, and there was some possibility that the patient would not be able to return home.  The patient wanted to feel better.  She was feeling that she was not happy.  We continued with her Cymbalta monitoring her mood and affect and added Seroquel.  Seroquel was not helping with her sleep. She was feeling fatigued, and we initiated Abilify for antidepressant augmentation.  The patient was beginning to improve, feeling satisfactory.  She was having less muscle pain with the Cymbalta. She was cooperative and denied any suicidal or homicidal thoughts.  She was tolerating her medications, and she was felt safe for discharge.  The patient was discharged home with a supplyof medications.  The patient was going to be staying with her family members in , and she was provided instructions for resources.  DISCHARGE MEDICATIONS: 1.  Cymbalta 60 mg daily. 2. Seroquel 100 mg at bedtime. 3. Restoril 15 mg nightly.     Landry Corporal, N.P.   ______________________________ Marlis Edelson, DO    JO/MEDQ  D:  05/15/2010  T:  05/15/2010  Job:  [id number]  Electronically Signed by Limmie PatriciaP. on 05/16/2010 01:11:28 PM Electronically Signed by Marlis Edelson MD on 05/16/2010 09:40:19 PM

## 2010-05-17 LAB — CBC
MCH: 32.6 pg (ref 26.0–34.0)
MCH: 32.7 pg (ref 26.0–34.0)
MCHC: 35.1 g/dL (ref 30.0–36.0)
MCV: 93 fL (ref 78.0–100.0)
Platelets: 280 10*3/uL (ref 150–400)
Platelets: 298 10*3/uL (ref 150–400)
RBC: 4.39 MIL/uL (ref 3.87–5.11)
RDW: 12.1 % (ref 11.5–15.5)
WBC: 12.3 10*3/uL — ABNORMAL HIGH (ref 4.0–10.5)
WBC: 14.5 10*3/uL — ABNORMAL HIGH (ref 4.0–10.5)

## 2010-05-17 LAB — URINALYSIS, ROUTINE W REFLEX MICROSCOPIC
Glucose, UA: NEGATIVE mg/dL
Glucose, UA: NEGATIVE mg/dL
Hgb urine dipstick: NEGATIVE
Nitrite: NEGATIVE
Protein, ur: NEGATIVE mg/dL
Protein, ur: NEGATIVE mg/dL
Urobilinogen, UA: 0.2 mg/dL (ref 0.0–1.0)
Urobilinogen, UA: 0.2 mg/dL (ref 0.0–1.0)
pH: 7 (ref 5.0–8.0)

## 2010-05-17 LAB — DIFFERENTIAL
Basophils Absolute: 0.2 10*3/uL — ABNORMAL HIGH (ref 0.0–0.1)
Basophils Relative: 1 % (ref 0–1)
Eosinophils Absolute: 0 10*3/uL (ref 0.0–0.7)
Eosinophils Absolute: 0.1 10*3/uL (ref 0.0–0.7)
Eosinophils Relative: 0 % (ref 0–5)
Lymphocytes Relative: 25 % (ref 12–46)
Lymphs Abs: 3 10*3/uL (ref 0.7–4.0)
Neutrophils Relative %: 68 % (ref 43–77)

## 2010-05-17 LAB — RAPID URINE DRUG SCREEN, HOSP PERFORMED
Amphetamines: NOT DETECTED
Benzodiazepines: NOT DETECTED
Cocaine: NOT DETECTED
Tetrahydrocannabinol: NOT DETECTED

## 2010-05-17 LAB — HEMOCCULT GUIAC POC 1CARD (OFFICE): Fecal Occult Bld: NEGATIVE

## 2010-05-17 LAB — URINE MICROSCOPIC-ADD ON

## 2010-05-17 LAB — BASIC METABOLIC PANEL
BUN: 9 mg/dL (ref 6–23)
CO2: 23 mEq/L (ref 19–32)
Calcium: 9.6 mg/dL (ref 8.4–10.5)
Chloride: 106 mEq/L (ref 96–112)
Creatinine, Ser: 0.78 mg/dL (ref 0.4–1.2)
Creatinine, Ser: 0.89 mg/dL (ref 0.4–1.2)
GFR calc Af Amer: >60 mL/min (ref >60)
GFR calc non Af Amer: >60 mL/min (ref >60)

## 2010-05-17 LAB — ETHANOL: Alcohol, Ethyl (B): <5 mg/dL (ref 0–10)

## 2010-06-06 ENCOUNTER — Encounter: Payer: Self-pay | Admitting: Family Medicine

## 2010-06-19 LAB — DIFFERENTIAL
Basophils Absolute: 0 10*3/uL (ref 0.0–0.1)
Lymphocytes Relative: 39 % (ref 12–46)
Monocytes Absolute: 0.6 10*3/uL (ref 0.1–1.0)
Neutro Abs: 3.1 10*3/uL (ref 1.7–7.7)
Neutrophils Relative %: 51 % (ref 43–77)

## 2010-06-19 LAB — URINALYSIS, ROUTINE W REFLEX MICROSCOPIC
Glucose, UA: NEGATIVE mg/dL
Hgb urine dipstick: NEGATIVE
Specific Gravity, Urine: 1.007 (ref 1.005–1.030)

## 2010-06-19 LAB — COMPREHENSIVE METABOLIC PANEL
Alkaline Phosphatase: 33 U/L — ABNORMAL LOW (ref 39–117)
BUN: 7 mg/dL (ref 6–23)
CO2: 21 mEq/L (ref 19–32)
Chloride: 107 mEq/L (ref 96–112)
Creatinine, Ser: 0.61 mg/dL (ref 0.4–1.2)
GFR calc non Af Amer: >60 mL/min (ref >60)
Glucose, Bld: 152 mg/dL — ABNORMAL HIGH (ref 70–99)
Potassium: 3.9 mEq/L (ref 3.5–5.1)
Total Bilirubin: 0.7 mg/dL (ref 0.3–1.2)

## 2010-06-19 LAB — POCT CARDIAC MARKERS: Myoglobin, poc: 120 ng/mL (ref 12–200)

## 2010-06-19 LAB — CBC
HCT: 38.1 % (ref 36.0–46.0)
Hemoglobin: 12.8 g/dL (ref 12.0–15.0)
Hemoglobin: 13.6 g/dL (ref 12.0–15.0)
MCV: 93.2 fL (ref 78.0–100.0)
Platelets: 191 10*3/uL (ref 150–400)
RBC: 4.09 MIL/uL (ref 3.87–5.11)
RBC: 4.31 MIL/uL (ref 3.87–5.11)
RDW: 12.7 % (ref 11.5–15.5)
WBC: 6.7 10*3/uL (ref 4.0–10.5)

## 2010-06-19 LAB — CARDIAC PANEL(CRET KIN+CKTOT+MB+TROPI)
Relative Index: 1.9 (ref 0.0–2.5)
Troponin I: <0.01 ng/mL (ref 0.00–0.06)

## 2010-06-19 LAB — PROTIME-INR
INR: 1.1 (ref 0.00–1.49)
Prothrombin Time: 14.6 seconds (ref 11.6–15.2)

## 2010-06-19 LAB — POCT PREGNANCY, URINE: Preg Test, Ur: NEGATIVE

## 2010-06-19 LAB — ACETAMINOPHEN LEVEL: Acetaminophen (Tylenol), Serum: 17.7 ug/mL (ref 10–30)

## 2010-06-19 LAB — CULTURE, BLOOD (ROUTINE X 2)
Culture: NO GROWTH
Culture: NO GROWTH

## 2010-06-19 LAB — RAPID URINE DRUG SCREEN, HOSP PERFORMED: Barbiturates: NOT DETECTED

## 2010-06-19 LAB — APTT: aPTT: 29 seconds (ref 24–37)

## 2010-06-19 LAB — ETHANOL: Alcohol, Ethyl (B): <5 mg/dL (ref 0–10)

## 2010-07-17 NOTE — H&P (Signed)
[name] Thomas               ACCOUNT NO.:  [id number]   MEDICAL RECORD NO.:  [id number]          PATIENT TYPE:  INP   LOCATION:  6727                         FACILITY:  [facility]   PHYSICIAN:  [name] Thomas, MDDATE OF BIRTH:  Dec 28, 1985   DATE OF ADMISSION:  04/05/2008  DATE OF DISCHARGE:                              HISTORY & PHYSICAL   PRIMARY CARE PHYSICIAN:  [name] Crane MD.   CHIEF COMPLAINT:  Could not breathe.   HISTORY OF PRESENT ILLNESS:  The patient is a 25 year old female who for  past few days has been having abdominal pain, mainly in the left lower  quadrant especially worse when she is trying to sit up to make any  movement.  She also noticed some blood in her urine, tried to take some  topical anesthetic for that but did not seem to work at which point she  presented to her primary care [name] Thomas who performed a pelvic exam,  thought that there were signs of possible PID and gave her a shot of  Rocephin while in the office and prescribed Vicodin, doxycycline and  Levaquin to be taking while she is at home.  The patient took the  medication, all three of them, the doxycycline, Levaquin and Vicodin,  and about 30 minutes thereafter started to notice that her face was  swelling up and her throat was closing up and maybe she even passed out.  EMS was called.  She was put on 100% non-rebreather.  Did not need to be  intubated.  She was given epinephrine and under lip steroids, and her  symptoms have improved, at which point [facility] hospitalist was called for  further admission.  So she was given epinephrine and Benadryl and  Atrovent nebs as well as albuterol nebs.  Currently, the patient is  feeling back to her normal self.  She is feeling somewhat hungry,  although she is still having some abdominal pain.  The patient reports  maybe subjective fevers at home, but otherwise, review of systems has  been negative except for as HPI.   PAST MEDICAL HISTORY:  1.  Depression, although the patient not taking any medications.  2. [facility] spotted fever in the past, treated with doxycycline.   MEDICATIONS:  The patient had recently been exposed to doxycycline,  Levaquin, Vicodin, Rocephin.  The Levaquin was the only medication she  took for the first time.   PHYSICAL EXAMINATION:  VITALS:  Temperature 97.2, blood pressure  128/103, pulse 117, respirations 13, satting 100% and 2 liters.  The  patient appears to be currently no acute distress.  HEENT:  Head:  Nontraumatic.  Moist mucous membranes.  Tongue and lips  seem to be slightly swollen still, but much better per family than  before.  Face does not appear to be swollen any more.  LUNGS:  Clear to auscultation bilaterally.  No wheezes or rhonchi noted.  HEART:  Rapid but regular.  No murmurs appreciated.  ABDOMEN:  Soft, no peritoneal signs noted but there is some left lower  quadrant tenderness.  LOWER EXTREMITIES: Without clubbing, cyanosis or  edema.  NEUROLOGIC: Intact.   LABORATORY DATA:  White blood cell count 6.1, hemoglobin 13.6.  No BNP  obtained.  Cardiac markers negative.  Urine pregnancy negative.  Acetaminophen salicylates unremarkable.  U-tox positive for opioids.  Alcohol negative.  UA unremarkable.  Chest x-ray unremarkable.  CT scan  of the head is also normal.   ASSESSMENT AND PLAN:  This is a 25 year old female with severe allergic  reaction, possible anaphylaxis to antibiotics, likely Levaquin.  Although, could not 100% rule out other medications that she took since  she took doxycycline and was recently exposed to Rocephin prior to this.  Although, she has taken doxycycline and Rocephin in the past or  penicillin related medications in the past without any trouble.  For  sure had been taking doxycycline before.  The symptoms started in 30  minutes after she took that doxycycline, Levaquin and Vicodin together.  1. Possible PID.  Will consult pharmacy and try figure  out what would      be the best approach and which antibiotics we can chose for now.      Will cover with aztreonam, and we will need to rethink how else we      can cover chlamydia.  May need ID consult to help with management.      If considering re-challenging with either doxycycline or Rocephin,      can do so possibly in ICU since those are prior less likely to      cause an allergy reaction as the patient had them in the past.      Will obtain CT of the abdomen and pelvis, try to determine if there      is any other pathology intra-abdominal which could explain her      symptoms.  Check blood cultures, await results of chlamydia and      gonorrhea that her primary care [name] Thomas has sent out.  Also, per      patient, she may have had a urinary tract infection.  Currently, UA      is negative.  I am hoping that her primary care [name] Thomas has sent a      urine for cultures as well.  Aztreonam should cover the UTI.  2. Recent allergic reaction.  We will monitor, give IV fluids,      continuous pulse ox.  3. For prophylaxis, will do SCDs and avoid unnecessary medications.   Dr. Rene Thomas will assume care in the a.m.      [name] Cowboy, MD  Electronically Signed     [name] Cowboy, MD  Electronically Signed    AVD/MEDQ  D:  04/05/2008  T:  04/06/2008  Job:  [id number]

## 2010-07-17 NOTE — Discharge Summary (Signed)
[name] Thomas, [name] Thomas               ACCOUNT NO.:  [id number]   MEDICAL RECORD NO.:  [id number]          PATIENT TYPE:  INP   LOCATION:  6727                         FACILITY:  [facility]   PHYSICIAN:  Valerie A. Felicity Coyer, MDDATE OF BIRTH:  June 23, 1985   DATE OF ADMISSION:  04/05/2008  DATE OF DISCHARGE:  04/06/2008                               DISCHARGE SUMMARY   DISCHARGE DIAGNOSES:  1. Presumed adverse reaction associated with dyspnea and question      anaphylaxis prior to admission, unclear cause, but symptoms      resolved.  See details below.  2. Left lower quadrant pain, also unclear etiology.  No evidence for      pelvic inflammatory disease, sexually transmitted disease, abscess,      or appendicitis.  Continue Vicodin as prior to admission without      change.  3. Irregular menstrual cycle with spotting.   DISCHARGE MEDICATIONS:  Vicodin 5/500 one p.o. q.4 h. p.r.n. moderate to  severe pain.  The patient is instructed to discontinue Levaquin and  doxycycline.   Hospital followup is scheduled with primary care physician, Dr. Gershon Crane within the week, April 11, 2008, at 1 p.m.  She is also instructed  to call if there is increasing pain, fever, or other problems prior to  her visit.   CONDITION ON DISCHARGE:  Medically stable and improved, afebrile with no  respiratory compromise, swelling, or other allergic complications.  No  evidence of ongoing infection.  See details below.   HOSPITAL COURSE BY PROBLEM:  1. Anaphylaxis/adverse reaction.  The patient is a 25 year old woman      who presented on the day of admission due to complaints of swelling      associated with shortness of breath.  The evening before admission      following administration that day, Vicodin, Levaquin, and      doxycycline in combination for presumed pelvic inflammatory      disease.  See details below.  She had been seen by her primary care      physician on the day with concerns of left-sided  lower quadrant      pain felt to be tender on pelvic exam with possible PID treated      with IM Rocephin and prescriptions given for Vicodin, Levaquin, and      doxycycline.  That evening after taking combination of these three      medications, she developed what she felt like was swelling of her      oropharynx is associated with shortness of breath.  They called ENT      who arrived and placed her on a 100% rebreather and treated her      with steroids and epinephrine.  She had no symptoms of persisting      anaphylaxis or swelling.  Hemodynamically stable and respiratory      uncompromised, yet concerning need for continued antibiotics in the      setting of possible pelvic inflammatory disease.  She was admitted      for further evaluation and treatment.  She  was placed on Azactam      and azithromycin in case she had adverse reaction to penicillin,      Levaquin, or doxycycline.  There was concern whether or not      Levaquin had been previously tried, but upon discussion with the      primary care physician, the patient had had a course of Levaquin 10      years prior associated with a sinus infection and ENT management.      The patient did not recall this administration.  She likewise has      tolerated doxycycline well.  As a previous hospitalization when she      was treated for [facility] spotted fever that the patient      recalls she did well with the IV form of this and not the oral      medication.  She is taking Vicodin in the past without reaction and      likewise was taking penicillin products without adverse reaction.      Discussion with her primary care physician reviewing, all these fax      off to notify that the urine test for gonococcus and chlamydia      returned negative.  Thus reducing the likelihood of PID and a CT of      the abdomen and pelvis was ordered and performed during      hospitalization showing a right adnexal cyst without enhancement       free fluid or other complicating features.  There was no other      inflammation and appendix was not well visualized, had no      surrounding edema or fluid in the pericolic region.  The patient is      afebrile.  She has had a normal white count throughout this      hospitalization and certainly appears nontoxic.  After further      discussion with the patient and her stepmother present at bedside,      we have determined that there is no evidence to suggest infection      as cause of her left lower quadrant symptoms and certainly no      evidence of STD.  I have recommended discontinuation of all      antibiotics given adverse reaction prior to admission and      recommended further followup with her primary care physician and      perhaps even Gynecology referral given her pelvic pain as well as      urinary complaints.  The patient and primary care physician agree      with this and the patient is otherwise felt medically stable for      discharge home at this time to continue Vicodin as needed for the      left lower quadrant pain until further explanation of workup can be      identified.  Urinalysis has been performed this hospitalization,      which was negative and blood cultures have been performed.  No      growth to date.  The final culture will need to be followed up on      the primary care physician.   Over 30 minutes spent on day of discharge coordinating outpatient care,  followup, and discussion with the patient's family, review of test, and  primary care physician discussion.      Valerie A. Felicity Coyer, MD  Electronically Signed  VAL/MEDQ  D:  04/06/2008  T:  04/07/2008  Job:  [id number]

## 2010-07-17 NOTE — H&P (Signed)
[name], [name] NO.:  Thomas   MEDICAL RECORD NO.:  [id number]          PATIENT TYPE:  OBV   LOCATION:  5012                         FACILITY:  [facility]   PHYSICIAN:  Hollice Espy, M.D.DATE OF BIRTH:  [birth year]   DATE OF ADMISSION:  10/16/2006  DATE OF DISCHARGE:                              HISTORY & PHYSICAL   PRIMARY CARE PHYSICIAN:  Tera Mater. Clent Ridges, M.D.   CHIEF COMPLAINT:  Nausea.   HISTORY OF PRESENT ILLNESS:  Patient is a 25 year old white female with  a past medical history of a recently diagnosed [facility] spotted  fever, which is a history obtained from the patient.  We are not able to  obtain any records here in the ER, but according to the patient she had  been having complaints of a rash and weakness as well as fatigue over  the past month.  She recalls having a puppy about a month ago that had a  lot of ticks, although she herself could not recall a tick bite.  She  had been worked up as an outpatient and was suspected of [facility]  spotted fever.  Apparently blood labs had been drawn and were reportedly  confirmed.  Dr. Clent Ridges had started the patient on doxycycline 100 mg p.o.  b.i.d. recently.  Since then the patient states that over the last week  she has had complaints of just severe nausea and vomiting, unable to  keep very little down, to the point where she just could not take  anymore and came into the emergency room.  In the emergency room she was  evaluated and with her inability to keep anything down and the need to  be on antibiotics, so best that she come in for symptomatic treatment  and IV doxycycline until she is able to tolerate p.o.  Currently the  patient complains of some generalized fatigue and some mild nausea,  although better with medication.  She denies any headaches, vision  changes, dysphagia, chest pain, palpitations, shortness of breath,  wheezing, coughing.  No abdominal pain, no hematuria,  dysuria,  constipation, diarrhea.  No focal extremity numbness, weakness or pain.  She complains of no joint pain, but does complain of some significant  generalized fatigue.  Review of systems is otherwise negative.   PAST MEDICAL HISTORY:  Reported recent diagnosis of  [facility]  spotted fever and tobacco abuse.   MEDICATIONS:  The patient is on doxycycline 100 mg p.o. b.i.d. as well  as perphenazine - amitriptyline daily.  She has no known drug allergies.   SOCIAL HISTORY:  She smokes about 1/2 pack-a-day.  No alcohol or drug  use.   FAMILY HISTORY:  Noncontributory.   PHYSICAL EXAMINATION:  VITAL SIGNS ON ADMISSION:  Temperature 97.3,  heart rate 113, blood pressure 121/82, respiration 18, O2 sat 99% on  room air.  GENERAL:  Patient is alert and oriented times 3.  In some generalized  distress in the fact that she does not want to come to the hospital, but  understands she has to.  HEENT:  Normocephalic, atraumatic.  Mucous membranes are moist.  She has  no carotid bruits.  HEART:  Regular rate and rhythm, S1, S2.  LUNGS:  Clear to auscultation bilaterally.  ABDOMEN:  Soft, nontender, nondistended.  Positive bowel sounds.  EXTREMITIES:  Show no clubbing, cyanosis, or edema.  DERM:  She has a few circular rashes with some raised clearing on her  arms, although none on her trunk or back.  Has a few on her legs, but  more so on her upper extremities.   LABORATORY WORK:  White count 7.8, H&H 15 and 44, MCV of 92, platelet  count 327.  No shift.  Sodium 136, potassium 4, chloride 106, bicarb 23  BUN 10, creatinine 0.8, glucose 93.  LFTs are unremarkable.  UA notes  greater than 80 of ketones, 30 of protein, small leukocyte esterase, but  only 3-6 white cells and a few bacteria.  Serum IgG and IgM antibodies  and a [facility] spotted fever [facility] have been ordered by the ER  attending.   ASSESSMENT AND PLAN:  1. Reported [facility] spotted fever.  We will treat  with      intravenous doxycycline until the patient is able to take oral.  2. Nausea and vomiting.  This maybe secondary to the illness itself      with the antibiotics or it is a generalized stomach flu.  Plan to      treat symptomatically with intravenous Zofran until the patient is      able to tolerate oral.  Change her doxycycline to intravenous.      When she is better we will discharge her home.  3. Tobacco abuse.  The patient declined a nicotine patch.      Hollice Espy, M.D.  Electronically Signed     SKK/MEDQ  D:  10/16/2006  T:  10/17/2006  Job:  [id number]   cc:   Jeannett Senior A. Clent Ridges, MD

## 2010-07-17 NOTE — Op Note (Signed)
NAMEELMINA, HENDEL               ACCOUNT NO.:  [id number]   MEDICAL RECORD NO.:  [id number]          PATIENT TYPE:  AMB   LOCATION:  DSC                          FACILITY:  [facility]   PHYSICIAN:  Christopher E. Ezzard Standing, M.D.DATE OF BIRTH:  [birth year]   DATE OF PROCEDURE:  06/05/2007  DATE OF DISCHARGE:                               OPERATIVE REPORT   PREOP DIAGNOSIS:  Chronic tonsillitis with tonsillar urgency.   POSTOPERATIVE DIAGNOSIS:  Chronic tonsillitis with tonsillar urgency.   OPERATION:  The tonsillectomy.   SURGEON:  Narda Bonds   ANESTHESIA:  General endotracheal.   COMPLICATIONS:  None.   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  She had a  negative strep tests as well as negative mono test.  On exam, she has  large 3+ almost kissing tonsils.  She has been on several rounds of  antibiotics as well as steroids and has continued have recurrent sore  throats and enlarged tonsils.  She is taken to operating room at this  time for tonsillectomy.   DESCRIPTION OF PROCEDURE:  After adequate endotracheal anesthesia, a  mouthgag was used to expose the oropharynx.  The left and right tonsils  were then resected from tonsillar fossa using cautery.  Care was taken  to preserve the anterior and posterior tonsillar pillars as well as the  uvula.  Hemostasis was obtained with cautery.  The oropharynx was  irrigated with saline.  This completed the procedure.  [name] Thomas was  awakened from anesthesia and transferred to the recovery room  postoperatively doing well.  [name] Thomas received 10 mg Decadron IV  preoperatively as well as a 1 g Ancef IV preoperatively.   PLAN:  [name] Thomas can be discharged to home either later this afternoon or  tomorrow morning on:  1. Amoxicillin suspension 4 mg b.i.d. for one week.  2. Tylenol and  Lortab elixir 3-4 teaspoons q.4 h., p.r.n. pain.   We will have her follow up in my office in two weeks for recheck..           ______________________________  Kristine Garbe Ezzard Standing, M.D.     CEN/MEDQ  D:  06/05/2007  T:  06/05/2007  Job:  [id number]   cc:   Kristine Garbe. Ezzard Standing, M.D.  Tera Mater. Clent Ridges, MD

## 2010-07-20 NOTE — Op Note (Signed)
[name], [name]               ACCOUNT NO.:  [id number]   MEDICAL RECORD NO.:  [id number]          PATIENT TYPE:  AMB   LOCATION:  DSC                          FACILITY:  [facility]   PHYSICIAN:  Christopher E. Ezzard Standing, M.D.DATE OF BIRTH:  05-Dec-1985   DATE OF PROCEDURE:  06/21/2005  DATE OF DISCHARGE:                                 OPERATIVE REPORT   PREOPERATIVE DIAGNOSIS:  Left neck lymphadenopathy.   POSTOPERATIVE DIAGNOSIS:  Left neck lymphadenopathy.   OPERATION:  Excisional biopsy of left submandibular lymph node.   SURGEON:  Kristine Garbe. Ezzard Standing, M.D.   ANESTHESIA:  General endotracheal.   COMPLICATIONS:  None.   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  She had  a negative Mono test, negative blood test with normal white count.  The  lymph nodes have been fairly persistent and have been intermittently painful  or causing discomfort.  Measure approximately 1 cm in size.  There is one in  the submandibular area as well as a few in the submental area and along the  jugular chain of lymph nodes.  Most of them on the left side, a few on the  right side.  She has been treated with antibiotics as well as steroids with  no improvement.  Because of the persistent lymphadenopathy, she is taken to  the operating room at this time for excisional biopsy of left submandibular  lymph node.   DESCRIPTION OF PROCEDURE:  After adequate endotracheal anesthesia, the  patient's neck was prepped with Betadine solution and draped with sterile  towels.  The area of the left submandibular node was marked out and injected  with Xylocaine with epinephrine.  The incision was made through the skin and  subcutaneous tissue.  The proximal platysma muscle was divided.  There was  one prominent node on top of normal-appearing submandibular gland.  The  gland itself was not really enlarged.  It was  soft to palpation.  There was  a lymph node adjacent to the external facial vein running over the  submandibular gland.  This node was carefully dissected out and sent in  saline fresh to pathology.  The node measured approximately 1 cm in size.  It was soft.  The submandibular gland itself appeared normal and benign, and  nothing else was done in this region.   After obtaining adequate hemostasis, the excision site was irrigated and  closed with 3-0 chromic sutures subcutaneously and 5-0 nylon to  reapproximate the skin edges, followed by Steri-Strips.  Kaisley was  awakened from anesthesia and transferred to the recovery room, __________  doing well.   DISPOSITION:  Franki is discharged home on Keflex 500 mg b.i.d. for 5 days,  Tylenol, Motrin, or Vicodin for pain.  We will have her follow up in my  office in six days to review pathology and have sutures removed.           ______________________________  Kristine Garbe Ezzard Standing, M.D.     CEN/MEDQ  D:  06/21/2005  T:  06/21/2005  Job:  [id number]   cc:   Jeannett Senior A. Clent Ridges, M.D. [facility]  [address] [place]  [place] [address]

## 2010-07-20 NOTE — Discharge Summary (Signed)
[name] Thomas, [name] Thomas NO.:  [id number]   MEDICAL RECORD NO.:  [id number]          PATIENT TYPE:  OBV   LOCATION:  5012                         FACILITY:  [facility]   PHYSICIAN:  Corwin Levins, MD      DATE OF BIRTH:  [birth year]   DATE OF ADMISSION:  10/16/2006  DATE OF DISCHARGE:  10/19/2006                               DISCHARGE SUMMARY   DISCHARGE DIAGNOSES:  1. [facility] spotted fever.  2. Tobacco abuse.  3. Nausea, vomiting presumably secondary to doxycycline by mouth.   PROCEDURE:  None.   CONSULTATIONS:  None.   HISTORY AND PHYSICAL:  See the dictated per Dr. Rito Ehrlich, day of  admission.   HOSPITAL COURSE:  [name] is a 25 year old white female with a past  medical history recent diagnosed [facility] spotted fever who  unfortunately was unable to tolerate the p.o. doxycycline.  She had such  difficulty with the p.o. therapy that she was admitted to obtain IV  doxycycline, to which she tolerated quite nicely.  Unfortunately, on the  third day of hospitalization she had some burning at the site of IV  doxycycline.  The nausea and vomiting had resolved and she resolved to  try the p.o. again.  She took one dose and seemed to tolerate it fairly  well without significant nausea and vomiting and asked to be discharged.   DISPOSITION:  Discharge to home in good condition.  There were no  activity or dietary restrictions.   DISCHARGE MEDICATIONS:  Include doxycycline 100 mg p.o. b.i.d. for 7  days.   FOLLOWUP:  She is to follow up with Dr. Drue Novel in 1 to 2 weeks.      Corwin Levins, MD  Electronically Signed     JWJ/MEDQ  D:  12/03/2006  T:  12/04/2006  Job:  [id number]

## 2010-07-20 NOTE — Assessment & Plan Note (Signed)
[facility]                                 ON-CALL NOTE   NAME:[name] Thomas, [name] Thomas                        MRN:          [id number]  DATE:04/19/2006                            DOB:          1985-10-04    PHONE:  [phone].   CALLER:  Laurence Slate, the mother.   PRIMARY CARE PHYSICIAN:  Dr. Clent Ridges.   SUBJECTIVE:  Everybody in the family has flu and has seen a doctor and  is on Tamiflu.  She is requesting Tamiflu prophylaxis.   ASSESSMENT AND PLAN:  Tamiflu 75 mg daily x10 days called to [facility].     Kerby Nora, MD  Electronically Signed    AB/MedQ  DD: 04/20/2006  DT: 04/20/2006  Job #: [id number]

## 2010-11-26 LAB — PATHOLOGIST SMEAR REVIEW

## 2010-11-26 LAB — CBC
Hemoglobin: 15.1 — ABNORMAL HIGH
MCHC: 35.3
RBC: 4.73
RDW: 12.7

## 2010-11-26 LAB — BASIC METABOLIC PANEL
CO2: 23
Calcium: 8.7
Creatinine, Ser: 0.62
GFR calc Af Amer: >60
GFR calc non Af Amer: >60
Sodium: 138

## 2010-11-26 LAB — DIFFERENTIAL
Basophils Relative: 1
Eosinophils Relative: 0
Lymphocytes Relative: 63 — ABNORMAL HIGH
Monocytes Absolute: 2.4 — ABNORMAL HIGH
Monocytes Relative: 10
Neutrophils Relative %: 26 — ABNORMAL LOW

## 2010-11-27 LAB — POCT HEMOGLOBIN-HEMACUE: Hemoglobin: 14.5

## 2010-12-17 LAB — COMPREHENSIVE METABOLIC PANEL
ALT: 21
AST: 20
Albumin: 4.5
CO2: 23
Chloride: 106
Creatinine, Ser: 0.78
GFR calc Af Amer: >60
GFR calc non Af Amer: >60
Potassium: 4
Sodium: 136
Total Bilirubin: 1.3 — ABNORMAL HIGH

## 2010-12-17 LAB — URINE MICROSCOPIC-ADD ON

## 2010-12-17 LAB — DIFFERENTIAL
Basophils Absolute: 0
Eosinophils Absolute: 0
Eosinophils Relative: 0
Lymphocytes Relative: 24
Monocytes Absolute: 0.6

## 2010-12-17 LAB — CBC
Platelets: 327
RBC: 4.77
WBC: 7.8

## 2010-12-17 LAB — POCT PREGNANCY, URINE
Operator id: 235261
Preg Test, Ur: NEGATIVE

## 2010-12-17 LAB — CULTURE, BLOOD (ROUTINE X 2)

## 2010-12-17 LAB — URINALYSIS, ROUTINE W REFLEX MICROSCOPIC
Glucose, UA: NEGATIVE
Nitrite: NEGATIVE
Specific Gravity, Urine: 1.034 — ABNORMAL HIGH
pH: 6.5

## 2015-03-20 ENCOUNTER — Emergency Department (INDEPENDENT_AMBULATORY_CARE_PROVIDER_SITE_OTHER)
Admission: EM | Admit: 2015-03-20 | Discharge: 2015-03-20 | Disposition: A | Discharge status: Other Acute Inpatient Hospital | Payer: Medicaid Other | Source: Home / Self Care | Attending: Family Medicine | Admitting: Family Medicine

## 2015-03-20 ENCOUNTER — Encounter (HOSPITAL_COMMUNITY): Payer: Self-pay | Admitting: *Deleted

## 2015-03-20 DIAGNOSIS — M545 Low back pain, unspecified: Secondary | ICD-10-CM

## 2015-03-20 LAB — POCT URINALYSIS DIP (DEVICE)
BILIRUBIN URINE: NEGATIVE
GLUCOSE, UA: NEGATIVE mg/dL
Hgb urine dipstick: NEGATIVE
KETONES UR: NEGATIVE mg/dL
LEUKOCYTES UA: NEGATIVE
Nitrite: NEGATIVE
Protein, ur: NEGATIVE mg/dL
Specific Gravity, Urine: 1.015 (ref 1.005–1.030)
Urobilinogen, UA: 0.2 mg/dL (ref 0.0–1.0)
pH: 6 (ref 5.0–8.0)

## 2015-03-20 LAB — POCT PREGNANCY, URINE: PREG TEST UR: NEGATIVE

## 2015-03-20 NOTE — ED Provider Notes (Signed)
CSN: [id number]     Arrival date & time 03/20/15  1619 History   First MD Initiated Contact with Patient 03/20/15 1752     Chief Complaint  Patient presents with  . Back Pain   (Consider location/radiation/quality/duration/timing/severity/associated sxs/prior Treatment) Patient is a 30 y.o. female presenting with back pain. The history is provided by the patient.  Back Pain Location:  Lumbar spine Quality:  Burning Radiates to:  Does not radiate Pain severity:  Moderate Onset quality:  Gradual Duration:  2 days Chronicity:  Recurrent Context: recent illness   Context comment:  Onset with similar sx 1.77mos ago treated as uti at home in [facility] resolved for approx 1 week with relapse 2 d ago after pushing stroller at [employer]. Associated symptoms: dysuria and pelvic pain     Past Medical History  Diagnosis Date  . GERD (gastroesophageal reflux disease)   . Depression   . Anxiety   . [place] spotted fever 2008   Past Surgical History  Procedure Laterality Date  . Tonsillectomy     Family History  Problem Relation Age of Onset  . Depression      fhx  . Bipolar disorder      fhx   Social History  Substance Use Topics  . Smoking status: Unknown If Ever Smoked  . Smokeless tobacco: None  . Alcohol Use: Yes   OB History    No data available     Review of Systems  Constitutional: Negative.   Gastrointestinal: Positive for nausea. Negative for vomiting.  Genitourinary: Positive for dysuria, urgency, frequency and pelvic pain.  Musculoskeletal: Positive for back pain.  Skin: Negative.   All other systems reviewed and are negative.   Allergies  Doxycycline and Levofloxacin  Home Medications   Prior to Admission medications   Medication Sig Start Date End Date Taking? Authorizing Provider  ALPRAZolam (XANAX) 0.25 MG tablet Take 0.25 mg by mouth 2 (two) times daily.      Historical Provider, MD  butalbital-acetaminophen-caffeine (FIORICET WITH CODEINE)  50-325-40-30 MG per capsule Take 1 capsule by mouth every 4 (four) hours as needed.      Historical Provider, MD  FLUoxetine (PROZAC) 20 MG capsule Take 20 mg by mouth daily.      Historical Provider, MD   Meds Ordered and Administered this Visit  Medications - No data to display  BP 128/85 mmHg  Pulse 73  Temp(Src) 98.1 F (36.7 C) (Oral)  Resp 16  SpO2 98% No data found.   Physical Exam  Constitutional: She is oriented to person, place, and time. She appears well-developed and well-nourished. She appears distressed.  HENT:  Mouth/Throat: Oropharynx is clear and moist.  Neck: Normal range of motion. Neck supple.  Cardiovascular: Normal heart sounds.   Pulmonary/Chest: Effort normal and breath sounds normal.  Abdominal: Soft. Bowel sounds are normal. She exhibits no distension and no mass. There is tenderness. There is no rebound and no guarding.  Musculoskeletal: She exhibits tenderness.       Lumbar back: She exhibits decreased range of motion, tenderness, pain and spasm. She exhibits no swelling and normal pulse.  Lymphadenopathy:    She has no cervical adenopathy.  Neurological: She is alert and oriented to person, place, and time.  Skin: Skin is warm and dry.  Nursing note and vitals reviewed.   ED Course  Procedures (including critical care time)  Labs Review Labs Reviewed  POCT URINALYSIS DIP (DEVICE)  POCT PREGNANCY, URINE   U/a and  upreg neg  Imaging Review No results found.   Visual Acuity Review  Right Eye Distance:   Left Eye Distance:   Bilateral Distance:    Right Eye Near:   Left Eye Near:    Bilateral Near:         MDM   1. Back pain at L4-L5 level    Sent for eval of acute pelvic and lbp uncertain etiol--u/a and upreg neg.    Linna HoffJames D Lorie Cleckley, MD 03/20/15 Rickey [id number]

## 2015-03-20 NOTE — ED Notes (Signed)
Pt  Reports  Back pain    And     Abdominal      Pain    For  Several  Days   Pt   Reports  Had  A      uti       1.5  Months  Ago      And     Was  Treated  With  Anti biotics   She   Reports  Nausea  As well

## 2015-04-30 ENCOUNTER — Ambulatory Visit (HOSPITAL_COMMUNITY)
Admission: RE | Admit: 2015-04-30 | Discharge: 2015-04-30 | Disposition: A | Discharge status: Home / Self Care | Payer: Self-pay | Attending: Psychiatry | Admitting: Psychiatry
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
## 2015-05-21 ENCOUNTER — Encounter (HOSPITAL_COMMUNITY): Payer: Self-pay | Admitting: Emergency Medicine

## 2015-05-21 ENCOUNTER — Emergency Department (HOSPITAL_COMMUNITY): Payer: Medicaid Other

## 2015-05-21 ENCOUNTER — Inpatient Hospital Stay (HOSPITAL_COMMUNITY)
Admission: EM | Admit: 2015-05-21 | Discharge: 2015-05-23 | DRG: 153 | Disposition: A | Discharge status: Home / Self Care | Payer: Medicaid Other | Attending: Internal Medicine | Admitting: Internal Medicine

## 2015-05-21 DIAGNOSIS — J111 Influenza due to unidentified influenza virus with other respiratory manifestations: Secondary | ICD-10-CM | POA: Diagnosis not present

## 2015-05-21 DIAGNOSIS — Z79899 Other long term (current) drug therapy: Secondary | ICD-10-CM

## 2015-05-21 DIAGNOSIS — R000 Tachycardia, unspecified: Secondary | ICD-10-CM | POA: Diagnosis present

## 2015-05-21 DIAGNOSIS — R1110 Vomiting, unspecified: Secondary | ICD-10-CM | POA: Diagnosis present

## 2015-05-21 DIAGNOSIS — R69 Illness, unspecified: Secondary | ICD-10-CM

## 2015-05-21 DIAGNOSIS — Z881 Allergy status to other antibiotic agents status: Secondary | ICD-10-CM

## 2015-05-21 DIAGNOSIS — K219 Gastro-esophageal reflux disease without esophagitis: Secondary | ICD-10-CM | POA: Diagnosis present

## 2015-05-21 DIAGNOSIS — R112 Nausea with vomiting, unspecified: Secondary | ICD-10-CM | POA: Diagnosis present

## 2015-05-21 DIAGNOSIS — F419 Anxiety disorder, unspecified: Secondary | ICD-10-CM | POA: Diagnosis present

## 2015-05-21 DIAGNOSIS — F329 Major depressive disorder, single episode, unspecified: Secondary | ICD-10-CM | POA: Diagnosis present

## 2015-05-21 LAB — COMPREHENSIVE METABOLIC PANEL
ALBUMIN: 3.4 g/dL — AB (ref 3.5–5.0)
ALK PHOS: 64 U/L (ref 38–126)
ALT: 16 U/L (ref 14–54)
ANION GAP: 13 (ref 5–15)
AST: 19 U/L (ref 15–41)
BUN: 13 mg/dL (ref 6–20)
CALCIUM: 8.5 mg/dL — AB (ref 8.9–10.3)
CO2: 23 mmol/L (ref 22–32)
CREATININE: 1.01 mg/dL — AB (ref 0.44–1.00)
Chloride: 103 mmol/L (ref 101–111)
GFR calc Af Amer: >60 mL/min (ref >60)
GFR calc non Af Amer: >60 mL/min (ref >60)
GLUCOSE: 105 mg/dL — AB (ref 65–99)
Potassium: 3.5 mmol/L (ref 3.5–5.1)
SODIUM: 139 mmol/L (ref 135–145)
Total Bilirubin: 0.6 mg/dL (ref 0.3–1.2)
Total Protein: 7.3 g/dL (ref 6.5–8.1)

## 2015-05-21 LAB — URINALYSIS, ROUTINE W REFLEX MICROSCOPIC
BILIRUBIN URINE: NEGATIVE
Glucose, UA: NEGATIVE mg/dL
KETONES UR: 40 mg/dL — AB
LEUKOCYTES UA: NEGATIVE
NITRITE: NEGATIVE
PROTEIN: NEGATIVE mg/dL
Specific Gravity, Urine: 1.009 (ref 1.005–1.030)
pH: 6 (ref 5.0–8.0)

## 2015-05-21 LAB — CBC WITH DIFFERENTIAL/PLATELET
BASOS ABS: 0 10*3/uL (ref 0.0–0.1)
Basophils Relative: 0 %
EOS ABS: 0 10*3/uL (ref 0.0–0.7)
Eosinophils Relative: 0 %
HEMATOCRIT: 39.9 % (ref 36.0–46.0)
HEMOGLOBIN: 13.4 g/dL (ref 12.0–15.0)
LYMPHS PCT: 4 %
Lymphs Abs: 1.2 10*3/uL (ref 0.7–4.0)
MCH: 31.3 pg (ref 26.0–34.0)
MCHC: 33.6 g/dL (ref 30.0–36.0)
MCV: 93.2 fL (ref 78.0–100.0)
MONOS PCT: 10 %
Monocytes Absolute: 2.9 10*3/uL — ABNORMAL HIGH (ref 0.1–1.0)
NEUTROS PCT: 86 %
Neutro Abs: 25.3 10*3/uL — ABNORMAL HIGH (ref 1.7–7.7)
Platelets: 274 10*3/uL (ref 150–400)
RBC: 4.28 MIL/uL (ref 3.87–5.11)
RDW: 12.8 % (ref 11.5–15.5)
WBC: 29.4 10*3/uL — ABNORMAL HIGH (ref 4.0–10.5)

## 2015-05-21 LAB — URINE MICROSCOPIC-ADD ON: Bacteria, UA: NONE SEEN

## 2015-05-21 LAB — I-STAT CG4 LACTIC ACID, ED: Lactic Acid, Venous: 0.73 mmol/L (ref 0.5–2.0)

## 2015-05-21 MED ORDER — MORPHINE SULFATE (PF) 2 MG/ML IV SOLN
2.0000 mg | INTRAVENOUS | Status: DC | PRN
  Administered 2015-05-22 – 2015-05-23 (×9): 2 mg via INTRAVENOUS
  Filled 2015-05-21 (×9): qty 1

## 2015-05-21 MED ORDER — SODIUM CHLORIDE 0.9 % IV SOLN
8.0000 mg | Freq: Four times a day (QID) | INTRAVENOUS | Status: DC | PRN
  Administered 2015-05-22 (×2): 8 mg via INTRAVENOUS
  Filled 2015-05-21 (×5): qty 4

## 2015-05-21 MED ORDER — IBUPROFEN 800 MG PO TABS: 800.0000 mg | ORAL_TABLET | Freq: Three times a day (TID) | ORAL | Status: DC

## 2015-05-21 MED ORDER — SODIUM CHLORIDE 0.9 % IV BOLUS (SEPSIS)
1000.0000 mL | Freq: Once | INTRAVENOUS | Status: AC
  Administered 2015-05-21: 1000 mL via INTRAVENOUS

## 2015-05-21 MED ORDER — OSELTAMIVIR PHOSPHATE 75 MG PO CAPS
75.0000 mg | ORAL_CAPSULE | Freq: Two times a day (BID) | ORAL | Status: DC
  Administered 2015-05-21 – 2015-05-23 (×4): 75 mg via ORAL
  Filled 2015-05-21 (×5): qty 1

## 2015-05-21 MED ORDER — SODIUM CHLORIDE 0.9 % IV BOLUS (SEPSIS)
2000.0000 mL | Freq: Once | INTRAVENOUS | Status: AC
  Administered 2015-05-21: 2000 mL via INTRAVENOUS

## 2015-05-21 MED ORDER — SODIUM CHLORIDE 0.9 % IV SOLN
INTRAVENOUS | Status: DC
  Administered 2015-05-22 – 2015-05-23 (×5): via INTRAVENOUS

## 2015-05-21 MED ORDER — ACETAMINOPHEN 325 MG PO TABS
650.0000 mg | ORAL_TABLET | Freq: Four times a day (QID) | ORAL | Status: DC
  Administered 2015-05-21 – 2015-05-23 (×8): 650 mg via ORAL
  Filled 2015-05-21 (×8): qty 2

## 2015-05-21 MED ORDER — PROCHLORPERAZINE MALEATE 10 MG PO TABS: 10.0000 mg | ORAL_TABLET | Freq: Four times a day (QID) | ORAL | Status: DC | PRN

## 2015-05-21 MED ORDER — ENOXAPARIN SODIUM 40 MG/0.4ML ~~LOC~~ SOLN
40.0000 mg | Freq: Every day | SUBCUTANEOUS | Status: DC
  Administered 2015-05-22 (×2): 40 mg via SUBCUTANEOUS
  Filled 2015-05-21 (×2): qty 0.4

## 2015-05-21 MED ORDER — HYDROMORPHONE HCL 1 MG/ML IJ SOLN
0.5000 mg | Freq: Once | INTRAMUSCULAR | Status: AC
  Administered 2015-05-21: 0.5 mg via INTRAVENOUS
  Filled 2015-05-21: qty 1

## 2015-05-21 MED ORDER — KETOROLAC TROMETHAMINE 15 MG/ML IJ SOLN
15.0000 mg | Freq: Three times a day (TID) | INTRAMUSCULAR | Status: DC | PRN
  Administered 2015-05-23: 15 mg via INTRAVENOUS
  Filled 2015-05-21: qty 1

## 2015-05-21 MED ORDER — PROCHLORPERAZINE EDISYLATE 5 MG/ML IJ SOLN
5.0000 mg | Freq: Four times a day (QID) | INTRAMUSCULAR | Status: DC | PRN
  Administered 2015-05-22 – 2015-05-23 (×3): 5 mg via INTRAVENOUS
  Filled 2015-05-21 (×3): qty 2

## 2015-05-21 MED ORDER — ONDANSETRON HCL 4 MG/2ML IJ SOLN
4.0000 mg | Freq: Once | INTRAMUSCULAR | Status: AC
  Administered 2015-05-21: 4 mg via INTRAVENOUS
  Filled 2015-05-21: qty 2

## 2015-05-21 MED ORDER — ACETAMINOPHEN 325 MG PO TABS
650.0000 mg | ORAL_TABLET | Freq: Once | ORAL | Status: AC
  Administered 2015-05-21: 650 mg via ORAL
  Filled 2015-05-21: qty 2

## 2015-05-21 MED ORDER — CLONAZEPAM 0.5 MG PO TABS
0.5000 mg | ORAL_TABLET | Freq: Two times a day (BID) | ORAL | Status: DC
  Administered 2015-05-22 – 2015-05-23 (×4): 0.5 mg via ORAL
  Filled 2015-05-21 (×4): qty 1

## 2015-05-21 MED ORDER — PROMETHAZINE HCL 25 MG/ML IJ SOLN
12.5000 mg | Freq: Once | INTRAMUSCULAR | Status: AC
  Administered 2015-05-21: 12.5 mg via INTRAVENOUS
  Filled 2015-05-21: qty 1

## 2015-05-21 MED ORDER — ONDANSETRON HCL 8 MG PO TABS: 8.0000 mg | ORAL_TABLET | Freq: Four times a day (QID) | ORAL | Status: DC | PRN

## 2015-05-21 MED ORDER — FLUOXETINE HCL 20 MG PO CAPS
40.0000 mg | ORAL_CAPSULE | Freq: Every day | ORAL | Status: DC
  Administered 2015-05-22 – 2015-05-23 (×2): 40 mg via ORAL
  Filled 2015-05-21 (×2): qty 2

## 2015-05-21 NOTE — ED Provider Notes (Signed)
CSN: [id number]     Arrival date & time 05/21/15  1440 History   First MD Initiated Contact with Patient 05/21/15 1535     Chief Complaint  Patient presents with  . Fever  . Tachycardia  . Emesis     (Consider location/radiation/quality/duration/timing/severity/associated sxs/prior Treatment) Patient is a 30 y.o. female presenting with fever and vomiting. The history is provided by the patient (patient complains of vomitingfor couple days.She's had a cough and fever 3 other family members have the same symptoms).  Fever Temp source:  Oral Severity:  Moderate Onset quality:  Gradual Timing:  Intermittent Progression:  Waxing and waning Chronicity:  New Relieved by:  Nothing Associated symptoms: cough and vomiting   Associated symptoms: no chest pain, no chills, no confusion, no congestion, no diarrhea, no headaches and no rash   Emesis Associated symptoms: no abdominal pain, no chills, no diarrhea and no headaches     Past Medical History  Diagnosis Date  . GERD (gastroesophageal reflux disease)   . Depression   . Anxiety   . Rocky Mountain spotted fever 2008   Past Surgical History  Procedure Laterality Date  . Tonsillectomy     Family History  Problem Relation Age of Onset  . Depression      fhx  . Bipolar disorder      fhx   Social History  Substance Use Topics  . Smoking status: Unknown If Ever Smoked  . Smokeless tobacco: None  . Alcohol Use: Yes   OB History    No data available     Review of Systems  Constitutional: Positive for fever. Negative for chills, appetite change and fatigue.  HENT: Negative for congestion, ear discharge and sinus pressure.   Eyes: Negative for discharge.  Respiratory: Positive for cough.   Cardiovascular: Negative for chest pain.  Gastrointestinal: Positive for vomiting. Negative for abdominal pain and diarrhea.  Genitourinary: Negative for frequency and hematuria.  Musculoskeletal: Negative for back pain and neck  stiffness.       Myalgias  Skin: Negative for rash.  Neurological: Negative for seizures and headaches.  Psychiatric/Behavioral: Negative for hallucinations and confusion.      Allergies  Doxycycline; Levaquin; and Levofloxacin  Home Medications   Prior to Admission medications   Medication Sig Start Date End Date Taking? Authorizing Provider  clonazePAM (KLONOPIN) 1 MG tablet Take 1 mg by mouth 2 (two) times daily as needed for anxiety.   Yes Historical Provider, MD  ibuprofen (ADVIL,MOTRIN) 200 MG tablet Take 800 mg by mouth every 6 (six) hours as needed for fever.   Yes Historical Provider, MD   BP 102/58 mmHg  Pulse 108  Temp(Src) 98.7 F (37.1 C) (Oral)  Resp 18  SpO2 96%  LMP 05/20/2015 Physical Exam  Constitutional: She is oriented to person, place, and time. She appears well-developed.  HENT:  Head: Normocephalic.  Neck supple,mucous membranes dry  Eyes: Conjunctivae and EOM are normal. No scleral icterus.  Neck: Neck supple. No thyromegaly present.  Cardiovascular: Normal rate and regular rhythm.  Exam reveals no gallop and no friction rub.   No murmur heard. Pulmonary/Chest: No stridor. She has no wheezes. She has no rales. She exhibits no tenderness.  Abdominal: She exhibits no distension. There is no tenderness. There is no rebound.  Musculoskeletal: Normal range of motion. She exhibits no edema.  Lymphadenopathy:    She has no cervical adenopathy.  Neurological: She is oriented to person, place, and time. She exhibits normal muscle tone.  Coordination normal.  Skin: No rash noted. No erythema.  Psychiatric: She has a normal mood and affect. Her behavior is normal.    ED Course  Procedures (including critical care time) Labs Review Labs Reviewed  CBC WITH DIFFERENTIAL/PLATELET - Abnormal; Notable for the following:    WBC 29.4 (*)    Neutro Abs 25.3 (*)    Monocytes Absolute 2.9 (*)    All other components within normal limits  COMPREHENSIVE METABOLIC  PANEL - Abnormal; Notable for the following:    Glucose, Bld 105 (*)    Creatinine, Ser 1.01 (*)    Calcium 8.5 (*)    Albumin 3.4 (*)    All other components within normal limits  URINALYSIS, ROUTINE W REFLEX MICROSCOPIC (NOT AT [facility]) - Abnormal; Notable for the following:    Hgb urine dipstick SMALL (*)    Ketones, ur 40 (*)    All other components within normal limits  URINE MICROSCOPIC-ADD ON - Abnormal; Notable for the following:    Squamous Epithelial / LPF 0-5 (*)    All other components within normal limits  CULTURE, BLOOD (ROUTINE X 2)  CULTURE, BLOOD (ROUTINE X 2)  INFLUENZA PANEL BY PCR (TYPE A & B, H1N1)  I-STAT CG4 LACTIC ACID, ED  I-STAT CG4 LACTIC ACID, ED    Imaging Review Dg Chest 2 View  05/21/2015  CLINICAL DATA:  Recently diagnosed loop 4 days ago. High fever. Myalgias. Vomiting. EXAM: CHEST  2 VIEW COMPARISON:  12/17/2009. FINDINGS: Normal sized heart. Minimal linear density in the right lower lung zone. Otherwise, clear lungs. Minimal central peribronchial thickening with improvement. Unremarkable bones. IMPRESSION: 1. Minimal bronchitic changes, with improvement. 2. Minimal linear atelectasis or scarring in the right lower lung zone. Electronically Signed   By: Beckie SaltsSteven  Reid M.D.   On: 05/21/2015 17:16   I have personally reviewed and evaluated these images and lab results as part of my medical decision-making.   EKG Interpretation None     atient has had vomiting for couple days with a cough fever Nonproductive cough. Patient states 2 children and another adult have the same symptoms at home. MDM   Final diagnoses:  Intractable vomiting with nausea, vomiting of unspecified type   Patient with persistent vomiting after 3 dosages of nausea medicine.   Labs unremarkable except for elevated white blood count.Chest x-ray shows bronchitis.Urine shows some ketones. Patient will be admitted for IV fluids and nausea medicine.  suspect flu. Flu study  pending   Bethann BerkshireJoseph Maurizio Geno, MD 05/21/15 2147

## 2015-05-21 NOTE — ED Notes (Addendum)
Per EMS, family has all had the flu. Pt began having nausea, vomiting, generalized body aches and lethargy.   Pt states fever of 104.4, says she's been having to take 4 advil to manage her fever. Having a headache, neck stiffness, pain down her back that is shooting into her legs. Has a productive cough. Has sensitivity to light and sound.

## 2015-05-21 NOTE — H&P (Signed)
History and Physical  Patient Name: [name] Morollyson E Poellnitz     ZOX:[id number]    DOB: 28-Aug-1985    DOA: 05/21/2015 Referring physician: Valeria BatmanJoe Zammit, MD PCP: Nelwyn SalisburyFRY,STEPHEN A, MD      Chief Complaint: Cough and body aches  HPI: [name] Thomas is a 30 y.o. female with no significant past medical history who presents with four days cough and myalgias.  The patient was in her usual state of health until about a week ago when she started to developed sniffles and a sore throat, in the context of several other members of her household being sick (her 2262-month-old, and her roommates). Then 4 days ago on Thursday, the patient started to develop cough, headache, fever, back pain, neck pain, myalgias, and malaise. She has had no dyspnea, purulent sputum, confusion.  In the ED, the patient had a low-grade temperature and tachycardia.  Na 139, K3.5, creatinine 1.0, WBC 20 9.4K, Hgb 13, lactate normal, urinalysis clear. Chest x-ray without focal opacity. Flu swab obtained, cultures obtained, and TRH were asked to evaluate for admission.     Review of Systems:  All other systems negative except as just noted or noted in the history of present illness.  Allergies  Allergen Reactions  . Doxycycline Anaphylaxis  . Levaquin [Levofloxacin In D5w] Anaphylaxis  . Levofloxacin Anaphylaxis    Prior to Admission medications   Medication Sig Start Date End Date Taking? Authorizing Provider  clonazePAM (KLONOPIN) 1 MG tablet Take 1 mg by mouth 2 (two) times daily as needed for anxiety.   Yes Historical Provider, MD  FLUoxetine (PROZAC) 20 MG tablet Take 40 mg by mouth daily.   Yes Historical Provider, MD  ibuprofen (ADVIL,MOTRIN) 200 MG tablet Take 800 mg by mouth every 6 (six) hours as needed for fever.   Yes Historical Provider, MD    Past Medical History  Diagnosis Date  . GERD (gastroesophageal reflux disease)   . Depression   . Anxiety   . Rocky Mountain spotted fever 2008    Past Surgical History    Procedure Laterality Date  . Tonsillectomy      Family history: family history includes Heart disease in her maternal grandfather and maternal grandmother.  Social History: Patient lives With her married friends and her 5162-month-old daughter. [place] [facility] [place] because it's cheaper to live here. She does not smoke or use alcohol to excess. She is independent with all ADLs.       Physical Exam: BP 102/58 mmHg  Pulse 108  Temp(Src) 98.7 F (37.1 C) (Oral)  Resp 18  SpO2 96%  LMP 05/20/2015 General appearance: Well-developed, adult female, alert and in moderate to severe distress from chills.   Eyes: Anicteric, conjunctiva pink, lids and lashes normal.     ENT: No nasal deformity, discharge, or epistaxis.  OP tacky dry without lesions.   Lymph: Tender but no lymphadenopathy appreciated in the neck or supraclavicular region. Skin: Warm and moist.  No jaundice.  No suspicious rashes or lesions. Cardiac: Tachycardic, regular, nl S1-S2, no murmurs appreciated.  Capillary refill is brisk.  Neck veins flat.  No LE edema.  Radial pulses appreciated but weak. Respiratory: Normal respiratory rate and rhythm.  CTAB without rales or wheezes. Abdomen: Abdomen soft without rigidity.  Diffuse TTP without masses, rigidity, rebound.  voluntary guarding. No ascites, distension.   MSK: No deformities or effusions.  No meningismus, negative Kernig sign. Neuro: Oriented and responsive to questions. Sensorium intact and responding to questions,  attention normal.  no photophobia. Speech is fluent.  Moves all extremities equally and with normal coordination.  Has chills, appears very uncomfortable.  Psych: Affect anxious.  No evidence of aural or visual hallucinations or delusions.       Labs on Admission:  The metabolic panel shows normal lites and renal function. Transaminases and bilirubin normal. Lactate normal. Urinalysis without pyuria or hematuria or bacteria. Blood  culture pending. Influenza swab pending. The complete blood count shows leukocytosis 20 9.4K, no anemia or thrombocytopenia.   Radiological Exams on Admission: Personally reviewed: Dg Chest 2 View  05/21/2015  CLINICAL DATA:  Recently diagnosed loop 4 days ago. High fever. Myalgias. Vomiting. EXAM: CHEST  2 VIEW COMPARISON:  12/17/2009. FINDINGS: Normal sized heart. Minimal linear density in the right lower lung zone. Otherwise, clear lungs. Minimal central peribronchial thickening with improvement. Unremarkable bones. IMPRESSION: 1. Minimal bronchitic changes, with improvement. 2. Minimal linear atelectasis or scarring in the right lower lung zone. Electronically Signed   By: Beckie Salts M.D.   On: 05/21/2015 17:16    EKG: Independently reviewed. Poor baseline, P waves present. Sinus tachycardia, rate 118, QTC 412. No ST changes.    Assessment/Plan 1. Influenza-like illness:  This is new.  The patient had a viral prodrome followed by fever, myalgias, malaise, chills. Meningitis started given the prolonged course of fever and malaise at home, negative meningismus, normal sensorium. No opacity on chest x-ray to suggest pneumonia at this time.  -Empiric oseltamivir -Follow-up flu swab -M IVF -Droplet precautions -Scheduled acetaminophen for back pain and body aches -Ketorolac 15 mg when necessary, switch to ibuprofen when no longer vomiting -Morphine if necessary -Ondansetron or Compazine when necessary for nausea -Procalcitonin ordered -Follow blood cultures   2. Depression/anxiety:  -Continue home fluoxetine and clonazepam     DVT PPx: Lovenox Diet: Regular Consultants: None Code Status: Full Family Communication: None present  Medical decision making: What exists of the patient's previous chart  was reviewed in depth and the case was discussed with Dr. Estell Harpin. Patient seen 10:00 PM on 05/21/2015.  Disposition Plan:  I recommend admission to medical surgical bed,  observation status.  Clinical condition: stable, suspect BP is close to baseline, patient mentating well and influenza suspected.  Anticipate observation overnight and follow flu swab.  Discharge pending oral challenge, fluid status, and flu testing.  Likely tomorrow or the day after, if test results reassuring tomorrow.      Alberteen Sam [facility] Pager [phone]

## 2015-05-21 NOTE — ED Notes (Signed)
Pt started her cycle urine specimen was contaminated.

## 2015-05-22 DIAGNOSIS — K219 Gastro-esophageal reflux disease without esophagitis: Secondary | ICD-10-CM | POA: Diagnosis present

## 2015-05-22 DIAGNOSIS — R000 Tachycardia, unspecified: Secondary | ICD-10-CM | POA: Diagnosis present

## 2015-05-22 DIAGNOSIS — J111 Influenza due to unidentified influenza virus with other respiratory manifestations: Secondary | ICD-10-CM | POA: Diagnosis not present

## 2015-05-22 DIAGNOSIS — Z881 Allergy status to other antibiotic agents status: Secondary | ICD-10-CM | POA: Diagnosis not present

## 2015-05-22 DIAGNOSIS — Z79899 Other long term (current) drug therapy: Secondary | ICD-10-CM | POA: Diagnosis not present

## 2015-05-22 DIAGNOSIS — F329 Major depressive disorder, single episode, unspecified: Secondary | ICD-10-CM | POA: Diagnosis present

## 2015-05-22 DIAGNOSIS — R509 Fever, unspecified: Secondary | ICD-10-CM | POA: Diagnosis present

## 2015-05-22 DIAGNOSIS — F419 Anxiety disorder, unspecified: Secondary | ICD-10-CM | POA: Diagnosis present

## 2015-05-22 DIAGNOSIS — R112 Nausea with vomiting, unspecified: Secondary | ICD-10-CM | POA: Diagnosis present

## 2015-05-22 LAB — CBC
HEMATOCRIT: 30.9 % — AB (ref 36.0–46.0)
HEMOGLOBIN: 10.3 g/dL — AB (ref 12.0–15.0)
MCH: 31.5 pg (ref 26.0–34.0)
MCHC: 33.3 g/dL (ref 30.0–36.0)
MCV: 94.5 fL (ref 78.0–100.0)
Platelets: 234 10*3/uL (ref 150–400)
RBC: 3.27 MIL/uL — ABNORMAL LOW (ref 3.87–5.11)
RDW: 13.1 % (ref 11.5–15.5)
WBC: 19.7 10*3/uL — ABNORMAL HIGH (ref 4.0–10.5)

## 2015-05-22 LAB — INFLUENZA PANEL BY PCR (TYPE A & B)
H1N1FLUPCR: NOT DETECTED
INFLAPCR: NEGATIVE
Influenza B By PCR: NEGATIVE

## 2015-05-22 LAB — BASIC METABOLIC PANEL
Anion gap: 9 (ref 5–15)
BUN: 9 mg/dL (ref 6–20)
CHLORIDE: 112 mmol/L — AB (ref 101–111)
CO2: 21 mmol/L — AB (ref 22–32)
CREATININE: 0.95 mg/dL (ref 0.44–1.00)
Calcium: 7.3 mg/dL — ABNORMAL LOW (ref 8.9–10.3)
GFR calc non Af Amer: >60 mL/min (ref >60)
Glucose, Bld: 150 mg/dL — ABNORMAL HIGH (ref 65–99)
POTASSIUM: 3.3 mmol/L — AB (ref 3.5–5.1)
Sodium: 142 mmol/L (ref 135–145)

## 2015-05-22 LAB — PROCALCITONIN: PROCALCITONIN: 1.22 ng/mL

## 2015-05-22 MED ORDER — POTASSIUM CHLORIDE CRYS ER 20 MEQ PO TBCR
30.0000 meq | EXTENDED_RELEASE_TABLET | ORAL | Status: AC
  Administered 2015-05-22 (×2): 30 meq via ORAL

## 2015-05-22 MED ORDER — SODIUM CHLORIDE 0.9 % IV BOLUS (SEPSIS)
500.0000 mL | Freq: Once | INTRAVENOUS | Status: AC
  Administered 2015-05-22: 500 mL via INTRAVENOUS

## 2015-05-22 NOTE — Progress Notes (Signed)
Patient Demographics  [name] Thomas, is a 30 y.o. female, DOB - 04-04-1985, XBJ:[id number]  Admit date - 05/21/2015   Admitting Physician Alberteen Samhristopher P Danford, MD  Outpatient Primary MD for the patient is Nelwyn SalisburyFRY,STEPHEN A, MD  LOS -    Chief Complaint  Patient presents with  . Fever  . Tachycardia  . Emesis       Subjective:   [name] Thomas today has,Complaints of generalized weakness, fatigue, congestion and generalized body ache.  Assessment & Plan    Principal Problem:   Influenza-like illness Active Problems:   Vomiting   Influenza-like illness This is new. The patient had a viral prodrome followed by fever, myalgias, malaise, chills. Meningitis Unlikely given the prolonged course of fever and malaise at home, negative meningismus, normal sensorium. No opacity on chest x-ray to suggest pneumonia at this time.  - Continue empirically with Tamiflu even influenza panel Is negative, as may be false negative, will await resp virus panel was panel, meanwhile continue with IV fluid, droplet precaution, Tylenol, Toradol, morphine and Zofran. - patient remains febrile, hypotensive with significant leukocytosis, will increase her IV fluid rate.  Depression/anxiety -Continue home fluoxetine and clonazepam  Code Status: Full  Family Communication: None at bedside  Disposition Plan: home when stable.   Procedures  None   Consults   None   Medications  Scheduled Meds: . acetaminophen  650 mg Oral Q6H  . clonazePAM  0.5 mg Oral BID  . enoxaparin (LOVENOX) injection  40 mg Subcutaneous QHS  . FLUoxetine  40 mg Oral Daily  . oseltamivir  75 mg Oral BID  . potassium chloride  30 mEq Oral Q4H   Continuous Infusions: . sodium chloride 175 mL/hr at 05/22/15 1053   PRN Meds:.ketorolac, morphine injection, ondansetron **OR** ondansetron (ZOFRAN) IV, prochlorperazine **OR**  prochlorperazine  DVT Prophylaxis  Lovenox   Lab Results  Component Value Date   PLT 234 05/22/2015    Antibiotics    Anti-infectives    Start     Dose/Rate Route Frequency Ordered Stop   05/21/15 2230  oseltamivir (TAMIFLU) capsule 75 mg     75 mg Oral 2 times daily 05/21/15 2217 05/26/15 2159          Objective:   Filed Vitals:   05/21/15 2011 05/21/15 2106 05/21/15 2256 05/22/15 0430  BP: 95/54 102/58 107/61 83/44  Pulse: 100 108 108 83  Temp:   99.5 F (37.5 C) 99.4 F (37.4 C)  TempSrc:   Oral Oral  Resp: 16 18 18 20   Height:   5\' 3"  (1.6 m)   Weight:   82.918 kg (182 lb 12.8 oz)   SpO2: 99% 96% 100% 97%    Wt Readings from Last 3 Encounters:  05/21/15 82.918 kg (182 lb 12.8 oz)  03/08/10 63.957 kg (141 lb)  03/01/10 64.411 kg (142 lb)    No intake or output data in the 24 hours ending 05/22/15 1223   Physical Exam  Awake Alert, ill -appearing, in mild distress from chills [place].AT,PERRAL Symmetrical Chest wall movement, Good air movement bilaterally, CTAB RRR,No Gallops,Rubs or new Murmurs, No Parasternal Heave +ve B.Sounds, Abd Soft, No tenderness, No organomegaly appriciated, No rebound - guarding or rigidity. No Cyanosis, Clubbing or edema,  No new Rash or bruise     Data Review   Micro Results No results found for this or any previous visit (from the past 240 hour(s)).  Radiology Reports Dg Chest 2 View  05/21/2015  CLINICAL DATA:  Recently diagnosed loop 4 days ago. High fever. Myalgias. Vomiting. EXAM: CHEST  2 VIEW COMPARISON:  12/17/2009. FINDINGS: Normal sized heart. Minimal linear density in the right lower lung zone. Otherwise, clear lungs. Minimal central peribronchial thickening with improvement. Unremarkable bones. IMPRESSION: 1. Minimal bronchitic changes, with improvement. 2. Minimal linear atelectasis or scarring in the right lower lung zone. Electronically Signed   By: Beckie Salts M.D.   On: 05/21/2015 17:16     CBC  Recent  Labs Lab 05/21/15 1606 05/22/15 0332  WBC 29.4* 19.7*  HGB 13.4 10.3*  HCT 39.9 30.9*  PLT 274 234  MCV 93.2 94.5  MCH 31.3 31.5  MCHC 33.6 33.3  RDW 12.8 13.1  LYMPHSABS 1.2  --   MONOABS 2.9*  --   EOSABS 0.0  --   BASOSABS 0.0  --     Chemistries   Recent Labs Lab 05/21/15 1606 05/22/15 0332  NA 139 142  K 3.5 3.3*  CL 103 112*  CO2 23 21*  GLUCOSE 105* 150*  BUN 13 9  CREATININE 1.01* 0.95  CALCIUM 8.5* 7.3*  AST 19  --   ALT 16  --   ALKPHOS 64  --   BILITOT 0.6  --    ------------------------------------------------------------------------------------------------------------------ estimated creatinine clearance is 90.7 mL/min (by C-G formula based on Cr of 0.95). ------------------------------------------------------------------------------------------------------------------ No results for input(s): HGBA1C in the last 72 hours. ------------------------------------------------------------------------------------------------------------------ No results for input(s): CHOL, HDL, LDLCALC, TRIG, CHOLHDL, LDLDIRECT in the last 72 hours. ------------------------------------------------------------------------------------------------------------------ No results for input(s): TSH, T4TOTAL, T3FREE, THYROIDAB in the last 72 hours.  Invalid input(s): FREET3 ------------------------------------------------------------------------------------------------------------------ No results for input(s): VITAMINB12, FOLATE, FERRITIN, TIBC, IRON, RETICCTPCT in the last 72 hours.  Coagulation profile No results for input(s): INR, PROTIME in the last 168 hours.  No results for input(s): DDIMER in the last 72 hours.  Cardiac Enzymes No results for input(s): CKMB, TROPONINI, MYOGLOBIN in the last 168 hours.  Invalid input(s): CK ------------------------------------------------------------------------------------------------------------------ Invalid input(s):  POCBNP     Time Spent in minutes   25 minutes   Tressy Kunzman M.D on 05/22/2015 at 12:23 PM  Between 7am to 7pm - Pager - [phone]  After 7pm go to [email or web address] - password [facility]  [facility]   Office  [phone]

## 2015-05-23 LAB — BASIC METABOLIC PANEL
ANION GAP: 7 (ref 5–15)
CHLORIDE: 116 mmol/L — AB (ref 101–111)
CO2: 21 mmol/L — AB (ref 22–32)
Calcium: 7.3 mg/dL — ABNORMAL LOW (ref 8.9–10.3)
Creatinine, Ser: 0.74 mg/dL (ref 0.44–1.00)
GFR calc Af Amer: >60 mL/min (ref >60)
GLUCOSE: 95 mg/dL (ref 65–99)
POTASSIUM: 3.9 mmol/L (ref 3.5–5.1)
Sodium: 144 mmol/L (ref 135–145)

## 2015-05-23 LAB — CBC
HEMATOCRIT: 35 % — AB (ref 36.0–46.0)
HEMOGLOBIN: 11.4 g/dL — AB (ref 12.0–15.0)
MCH: 30.6 pg (ref 26.0–34.0)
MCHC: 32.6 g/dL (ref 30.0–36.0)
MCV: 94.1 fL (ref 78.0–100.0)
Platelets: 303 10*3/uL (ref 150–400)
RBC: 3.72 MIL/uL — AB (ref 3.87–5.11)
RDW: 13.1 % (ref 11.5–15.5)
WBC: 10.2 10*3/uL (ref 4.0–10.5)

## 2015-05-23 LAB — PHOSPHORUS: Phosphorus: 2.2 mg/dL — ABNORMAL LOW (ref 2.5–4.6)

## 2015-05-23 LAB — PROCALCITONIN: Procalcitonin: 0.55 ng/mL

## 2015-05-23 LAB — MAGNESIUM: Magnesium: 1.9 mg/dL (ref 1.7–2.4)

## 2015-05-23 MED ORDER — OSELTAMIVIR PHOSPHATE 75 MG PO CAPS: 75.0000 mg | ORAL_CAPSULE | Freq: Two times a day (BID) | ORAL | Status: AC

## 2015-05-23 MED ORDER — ONDANSETRON HCL 8 MG PO TABS: 8.0000 mg | ORAL_TABLET | Freq: Four times a day (QID) | ORAL | Status: AC | PRN

## 2015-05-23 MED ORDER — ACETAMINOPHEN 325 MG PO TABS: 650.0000 mg | ORAL_TABLET | Freq: Four times a day (QID) | ORAL | Status: AC

## 2015-05-23 NOTE — Discharge Summary (Signed)
[name] Thomas, is a 30 y.o. female  DOB 1985-06-15  MRN [id number].  Admission date:  05/21/2015  Admitting Physician  Alberteen Sam, MD  Discharge Date:  05/23/2015   Primary MD  Nelwyn Salisbury, MD  Recommendations for primary care physician for things to follow:  -  check CBC, BMP, 2 view chest x-ray during next visit.   Admission Diagnosis  Intractable vomiting with nausea, vomiting of unspecified type [R11.10]   Discharge Diagnosis  Intractable vomiting with nausea, vomiting of unspecified type [R11.10]    Principal Problem:   Influenza-like illness Active Problems:   Vomiting      Past Medical History  Diagnosis Date  . GERD (gastroesophageal reflux disease)   . Depression   . Anxiety   . [place] spotted fever 2008    Past Surgical History  Procedure Laterality Date  . Tonsillectomy         History of present illness and  Hospital Course:     Kindly see H&P for history of present illness and admission details, please review complete Labs, Consult reports and Test reports for all details in brief  HPI  from the history and physical done on the day of admission 05/21/2015  HPI: [name] Thomas is a 30 y.o. female with no significant past medical history who presents with four days cough and myalgias.  The patient was in her usual state of health until about a week ago when she started to developed sniffles and a sore throat, in the context of several other members of her household being sick (her 45-month-old, and her roommates). Then 4 days ago on Thursday, the patient started to develop cough, headache, fever, back pain, neck pain, myalgias, and malaise. She has had no dyspnea, purulent sputum, confusion.  In the ED, the patient had a low-grade temperature and tachycardia. Na 139, K3.5, creatinine 1.0, WBC 20 9.4K, Hgb 13, lactate normal, urinalysis clear. Chest  x-ray without focal opacity. Flu swab obtained, cultures obtained, and TRH were asked to evaluate for admission.  Hospital Course   Influenza-like illness This is new. The patient had a viral prodrome followed by fever, myalgias, malaise, chills. Meningitis Unlikely given the prolonged course of fever and malaise at home, negative meningismus, normal sensorium. No opacity on chest x-ray to suggest pneumonia at this time.  - Continue empirically with Tamiflu even influenza panel Is negative, as may be false negative, respiratory virus panel was sent, results are pending at time of discharge , continue another 3 days of by mouth Tamiflu empirically. - Leukocytosis resolved, she is afebrile over last 24 hours, the blood pressure stable at time of discharge .  Depression/anxiety - Continue home fluoxetine and clonazepam     Discharge Condition:  stable   Follow UP  Follow-up Information    Follow up with FRY,STEPHEN A, MD. Schedule an appointment as soon as possible for a visit in 1 week.   Specialty:  Family Medicine   Why:  Posthospitalization follow-up   Contact information:  [address] [place] [address] [phone]         Discharge Instructions  and  Discharge Medications     Discharge Instructions    Discharge instructions    Complete by:  As directed   Follow with Primary MD Gershon Crane A, MD in 7 days   Get CBC, CMP, 2 view Chest X ray checked  by Primary MD next visit.    Activity: As tolerated with Full fall precautions use walker/cane & assistance as needed   Disposition Home   Diet: Regular diet  For Heart failure patients - Check your Weight same time everyday, if you gain over 2 pounds, or you develop in leg swelling, experience more shortness of breath or chest pain, call your Primary MD immediately. Follow Cardiac Low Salt Diet and 1.5 lit/day fluid restriction.   On your next visit with your primary care physician please Get  Medicines reviewed and adjusted.   Please request your Prim.MD to go over all Hospital Tests and Procedure/Radiological results at the follow up, please get all Hospital records sent to your Prim MD by signing hospital release before you go home.   If you experience worsening of your admission symptoms, develop shortness of breath, life threatening emergency, suicidal or homicidal thoughts you must seek medical attention immediately by calling 911 or calling your MD immediately  if symptoms less severe.  You Must read complete instructions/literature along with all the possible adverse reactions/side effects for all the Medicines you take and that have been prescribed to you. Take any new Medicines after you have completely understood and accpet all the possible adverse reactions/side effects.   Do not drive, operating heavy machinery, perform activities at heights, swimming or participation in water activities or provide baby sitting services if your were admitted for syncope or siezures until you have seen by Primary MD or a Neurologist and advised to do so again.  Do not drive when taking Pain medications.    Do not take more than prescribed Pain, Sleep and Anxiety Medications  Special Instructions: If you have smoked or chewed Tobacco  in the last 2 yrs please stop smoking, stop any regular Alcohol  and or any Recreational drug use.  Wear Seat belts while driving.   Please note  You were cared for by a hospitalist during your hospital stay. If you have any questions about your discharge medications or the care you received while you were in the hospital after you are discharged, you can call the unit and asked to speak with the hospitalist on call if the hospitalist that took care of you is not available. Once you are discharged, your primary care physician will handle any further medical issues. Please note that NO REFILLS for any discharge medications will be authorized once you are  discharged, as it is imperative that you return to your primary care physician (or establish a relationship with a primary care physician if you do not have one) for your aftercare needs so that they can reassess your need for medications and monitor your lab values.     Increase activity slowly    Complete by:  As directed             Medication List    TAKE these medications        acetaminophen 325 MG tablet  Commonly known as:  TYLENOL  Take 2 tablets (650 mg total) by mouth every 6 (six) hours.     clonazePAM 1 MG  tablet  Commonly known as:  KLONOPIN  Take 1 mg by mouth 2 (two) times daily as needed for anxiety.     FLUoxetine 20 MG tablet  Commonly known as:  PROZAC  Take 40 mg by mouth daily.     ibuprofen 200 MG tablet  Commonly known as:  ADVIL,MOTRIN  Take 800 mg by mouth every 6 (six) hours as needed for fever.     ondansetron 8 MG tablet  Commonly known as:  ZOFRAN  Take 1 tablet (8 mg total) by mouth every 6 (six) hours as needed for nausea.     oseltamivir 75 MG capsule  Commonly known as:  TAMIFLU  Take 1 capsule (75 mg total) by mouth 2 (two) times daily.          Diet and Activity recommendation: See Discharge Instructions above   Consults obtained -  none   Major procedures and Radiology Reports - PLEASE review detailed and final reports for all details, in brief -      Dg Chest 2 View  05/21/2015  CLINICAL DATA:  Recently diagnosed loop 4 days ago. High fever. Myalgias. Vomiting. EXAM: CHEST  2 VIEW COMPARISON:  12/17/2009. FINDINGS: Normal sized heart. Minimal linear density in the right lower lung zone. Otherwise, clear lungs. Minimal central peribronchial thickening with improvement. Unremarkable bones. IMPRESSION: 1. Minimal bronchitic changes, with improvement. 2. Minimal linear atelectasis or scarring in the right lower lung zone. Electronically Signed   By: Beckie Salts M.D.   On: 05/21/2015 17:16    Micro Results     No results  found for this or any previous visit (from the past 240 hour(s)).     Today   Subjective:   [name] Thomas today  afebrile over the last 24 hours, still complaining of generalized body ache but it is improving, reports mild nausea but no vomiting .   Objective:   Blood pressure 103/58, pulse 87, temperature 98.2 F (36.8 C), temperature source Oral, resp. rate 20, height  (1.6 m), weight 82.918 kg (182 lb 12.8 oz), last menstrual period 05/21/2015, SpO2 92 %.   Intake/Output Summary (Last 24 hours) at 05/23/15 1205 Last data filed at 05/23/15 1118  Gross per 24 hour  Intake   1295 ml  Output   1325 ml  Net    -30 ml    Exam Awake Alert, Oriented x 3 [place].AT,PERRAL Supple Neck,No JVD,  Symmetrical Chest wall movement, Good air movement bilaterally, CTAB RRR,No Gallops,Rubs or new Murmurs, No Parasternal Heave +ve B.Sounds, Abd Soft, Non tender, No organomegaly appriciated, No rebound -guarding or rigidity. No Cyanosis, Clubbing or edema, No new Rash or bruise  Data Review   CBC w Diff: Lab Results  Component Value Date   WBC 10.2 05/23/2015   HGB 11.4* 05/23/2015   HCT 35.0* 05/23/2015   PLT 303 05/23/2015   LYMPHOPCT 4 05/21/2015   MONOPCT 10 05/21/2015   EOSPCT 0 05/21/2015   BASOPCT 0 05/21/2015    CMP: Lab Results  Component Value Date   NA 144 05/23/2015   K 3.9 05/23/2015   CL 116* 05/23/2015   CO2 21* 05/23/2015   BUN <5* 05/23/2015   CREATININE 0.74 05/23/2015   PROT 7.3 05/21/2015   ALBUMIN 3.4* 05/21/2015   BILITOT 0.6 05/21/2015   ALKPHOS 64 05/21/2015   AST 19 05/21/2015   ALT 16 05/21/2015  .   Total Time in preparing paper work, data evaluation and todays exam - 35 minutes  Randol KernELGERGAWY, Harlow Basley M.D on 05/23/2015 at 12:05 PM  [facility]   Office  [phone]

## 2015-05-23 NOTE — Progress Notes (Signed)
Patient maintained O2 sat of 100% on room air while ambulating

## 2015-05-23 NOTE — Discharge Instructions (Signed)
Follow with Primary MD Nelwyn SalisburyFRY,STEPHEN A, MD in 7 days   Get CBC, CMP, 2 view Chest X ray checked  by Primary MD next visit.    Activity: As tolerated with Full fall precautions use walker/cane & assistance as needed   Disposition Home   Diet: Regular diet  For Heart failure patients - Check your Weight same time everyday, if you gain over 2 pounds, or you develop in leg swelling, experience more shortness of breath or chest pain, call your Primary MD immediately. Follow Cardiac Low Salt Diet and 1.5 lit/day fluid restriction.   On your next visit with your primary care physician please Get Medicines reviewed and adjusted.   Please request your Prim.MD to go over all Hospital Tests and Procedure/Radiological results at the follow up, please get all Hospital records sent to your Prim MD by signing hospital release before you go home.   If you experience worsening of your admission symptoms, develop shortness of breath, life threatening emergency, suicidal or homicidal thoughts you must seek medical attention immediately by calling 911 or calling your MD immediately  if symptoms less severe.  You Must read complete instructions/literature along with all the possible adverse reactions/side effects for all the Medicines you take and that have been prescribed to you. Take any new Medicines after you have completely understood and accpet all the possible adverse reactions/side effects.   Do not drive, operating heavy machinery, perform activities at heights, swimming or participation in water activities or provide baby sitting services if your were admitted for syncope or siezures until you have seen by Primary MD or a Neurologist and advised to do so again.  Do not drive when taking Pain medications.    Do not take more than prescribed Pain, Sleep and Anxiety Medications  Special Instructions: If you have smoked or chewed Tobacco  in the last 2 yrs please stop smoking, stop any regular  Alcohol  and or any Recreational drug use.  Wear Seat belts while driving.   Please note  You were cared for by a hospitalist during your hospital stay. If you have any questions about your discharge medications or the care you received while you were in the hospital after you are discharged, you can call the unit and asked to speak with the hospitalist on call if the hospitalist that took care of you is not available. Once you are discharged, your primary care physician will handle any further medical issues. Please note that NO REFILLS for any discharge medications will be authorized once you are discharged, as it is imperative that you return to your primary care physician (or establish a relationship with a primary care physician if you do not have one) for your aftercare needs so that they can reassess your need for medications and monitor your lab values.

## 2015-05-24 LAB — RESPIRATORY VIRUS PANEL
Adenovirus: NEGATIVE
INFLUENZA B 1: NEGATIVE
Influenza A: NEGATIVE
METAPNEUMOVIRUS: NEGATIVE
PARAINFLUENZA 1 A: NEGATIVE
PARAINFLUENZA 3 A: NEGATIVE
Parainfluenza 2: NEGATIVE
RESPIRATORY SYNCYTIAL VIRUS A: NEGATIVE
RHINOVIRUS: NEGATIVE
Respiratory Syncytial Virus B: NEGATIVE

## 2015-05-27 LAB — CULTURE, BLOOD (ROUTINE X 2)
CULTURE: NO GROWTH
Culture: NO GROWTH

## 2018-07-08 LAB — HEMOGLOBIN A1C
Estimated Avg Glucose, External: 103 mg/dL (ref 77–114)
Hemoglobin A1C, External: 5.2 % (ref 4.3–5.6)

## 2018-12-04 LAB — BASIC METABOLIC PANEL
Anion Gap: 13 mmol/L (ref 2–17)
BUN: 15 mg/dL (ref 6–20)
CO2: 21 mmol/L — ABNORMAL LOW (ref 22–29)
Calcium: 9.8 mg/dL (ref 8.6–10.0)
Chloride: 105 mmol/L (ref 98–107)
Creatinine: 0.9 mg/dL (ref 0.5–0.9)
GFR African American: 99 mL/min/{1.73_m2} (ref >=90)
GFR Non-African American: 85 mL/min/{1.73_m2} — ABNORMAL LOW (ref >=90)
Glucose: 124 mg/dL — ABNORMAL HIGH (ref 70–99)
OSMOLALITY CALCULATED: 280 mOsm/kg (ref 270–287)
Potassium: 4.6 mmol/L (ref 3.5–5.3)
Sodium: 139 mmol/L (ref 135–145)

## 2018-12-04 LAB — CBC WITH AUTO DIFFERENTIAL
Absolute Baso #: 0 10*3/uL (ref 0.0–0.2)
Absolute Eos #: 0 10*3/uL (ref 0.0–0.5)
Absolute Lymph #: 2 10*3/uL (ref 1.0–3.2)
Absolute Mono #: 0.9 10*3/uL (ref 0.3–1.0)
Basophils %: 0.1 % (ref 0.0–2.0)
Eosinophils %: 0 % (ref 0.0–7.0)
Hematocrit: 45.5 % (ref 34.0–47.0)
Hemoglobin: 15.5 g/dL (ref 11.5–15.7)
Immature Grans (Abs): 0.06 10*3/uL (ref 0.00–0.06)
Immature Granulocytes: 0.5 % (ref 0.1–0.6)
Lymphocytes: 18 % (ref 15.0–45.0)
MCH: 31.1 pg (ref 27.0–34.5)
MCHC: 34.1 g/dL (ref 32.0–36.0)
MCV: 91.4 fL (ref 81.0–99.0)
MPV: 9.2 fL (ref 7.2–13.2)
Monocytes: 7.7 % (ref 4.0–12.0)
Neutrophils %: 73.7 % (ref 42.0–74.0)
Neutrophils Absolute: 8.1 10*3/uL — ABNORMAL HIGH (ref 1.6–7.3)
Platelets: 430 10*3/uL (ref 140–440)
RBC: 4.98 x10e6/mcL (ref 3.60–5.20)
RDW: 12.6 % (ref 11.0–16.0)
WBC: 11 10*3/uL — ABNORMAL HIGH (ref 3.8–10.6)

## 2018-12-04 LAB — POC URINALYSIS, CHEMISTRY
Bilirubin, Urine, POC: NEGATIVE
Glucose, UA POC: NEGATIVE mg/dL
Ketones, Urine, POC: 40 mg/dL — AB
Leukocytes, UA: NEGATIVE
Nitrate, UA POC: NEGATIVE
Protein, Urine, POC: NEGATIVE
Specific Gravity, Urine, POC: >=1.03 — AB (ref 1.003–1.035)
UROBILIN U POC: 0.2 EU/dL
pH, Urine, POC: 8 (ref 4.5–8.0)

## 2018-12-04 LAB — HEPATIC FUNCTION PANEL
ALT: 44 U/L — ABNORMAL HIGH (ref 0–33)
AST: 24 U/L (ref 0–32)
Albumin: 4.8 g/dL (ref 3.5–5.2)
Alk Phosphatase: 49 U/L (ref 35–117)
Bilirubin, Direct: <0.2 mg/dL (ref 0.00–0.30)
Total Bilirubin: 0.31 mg/dL (ref 0.00–1.20)
Total Protein: 8.2 g/dL (ref 6.4–8.3)

## 2018-12-04 LAB — LIPASE: Lipase: 31 U/L (ref 13–60)

## 2018-12-04 LAB — LACTIC ACID: Lactic Acid: 0.8 mmol/L (ref 0.5–2.0)

## 2018-12-04 NOTE — ED Notes (Signed)
ED Triage Note       ED Secondary Triage Entered On:  12/04/2018 18:37 EDT    Performed On:  12/04/2018 18:35 EDT by [name], RN, Hannah               General Information   Barriers to Learning :   None evident   ED Home Meds Section :   Document assessment   [facility] ED Fall Risk Section :   Document assessment   ED History Section :   Document assessment   ED Advance Directives Section :   Document assessment   ED Palliative Screen :   N/A (prefilled for <65yo)   [name] RNDahlia Client - 12/04/2018 18:35 EDT   (As Of: 12/04/2018 18:37:13 EDT)   Problems(Active)    Colitis (SNOMED CT  :384665993 )  Name of Problem:   Colitis ; Recorder:   [name], RN, [name]; Confirmation:   Confirmed ; Classification:   Patient Stated ; Code:   [id number] ; Contributor System:   Dietitian ; Last Updated:   12/04/2018 18:37 EDT ; Life Cycle Date:   12/04/2018 ; Life Cycle Status:   Active ; Vocabulary:   SNOMED CT        Crohn disease (SNOMED CT  :03009233 )  Name of Problem:   Crohn disease ; Recorder:   [name], RN, [name]; Confirmation:   Confirmed ; Classification:   Patient Stated ; Code:   [id number] ; Contributor System:   PowerChart ; Last Updated:   12/04/2018 17:50 EDT ; Life Cycle Date:   12/04/2018 ; Life Cycle Status:   Active ; Vocabulary:   SNOMED CT          Diagnoses(Active)    Abdominal pain  Date:   12/04/2018 ; Diagnosis Type:   Reason For Visit ; Confirmation:   Complaint of ; Clinical Dx:   Abdominal pain ; Classification:   Medical ; Clinical Service:   Non-Specified ; Code:   PNED ; Probability:   0 ; Diagnosis Code:   [id number]      Chills  Date:   12/04/2018 ; Diagnosis Type:   Reason For Visit ; Confirmation:   Complaint of ; Clinical Dx:   Chills ; Classification:   Medical ; Clinical Service:   Non-Specified ; Code:   PNED ; Probability:   0 ; Diagnosis Code:   [id number]      Nausea  Date:   12/04/2018 ; Diagnosis Type:   Reason For Visit ; Confirmation:   Complaint  of ; Clinical Dx:   Nausea ; Classification:   Medical ; Clinical Service:   Non-Specified ; Code:   PNED ; Probability:   0 ; Diagnosis Code:   [id number]      Vomiting  Date:   12/04/2018 ; Diagnosis Type:   Reason For Visit ; Confirmation:   Complaint of ; Clinical Dx:   Vomiting ; Classification:   Medical ; Clinical Service:   Non-Specified ; Code:   PNED ; Probability:   0 ; Diagnosis Code:   [id number]             -    Procedure History   (As Of: 12/04/2018 18:37:13 EDT)     [name] Fall Risk Assessment Tool   Hx of falling last 3 months ED Fall :   No   Patient confused or disoriented ED Fall :   No   Patient intoxicated or sedated ED Fall :   No  Patient impaired gait ED Fall :   No   Use a mobility assistance device ED Fall :   No   Patient altered elimination ED Fall :   No   [facility] ED Fall Score :   0    [name] [name] - 12/04/2018 18:35 EDT   ED Advance Directive   Advance Directive :   No   [name] RNJarrett Soho - 12/04/2018 18:35 EDT   Social History   Social History   (As Of: 12/04/2018 18:37:13 EDT)   Tobacco:        Tobacco use: 5-9 cigarettes (between 1/4 to 1/2 pack)/day in last 30 days.   (Last Updated: 12/04/2018 18:36:44 EDT by [name] RN, [name])          Alcohol:        Denies   (Last Updated: 12/04/2018 18:36:44 EDT by [name] RN, [name])          Substance Use:        Denies   (Last Updated: 12/04/2018 18:36:44 EDT by [name] RN, [name])

## 2018-12-04 NOTE — ED Notes (Signed)
ED Patient Education Note     Patient Education Materials Follows:  Immunology     Crohn Disease    Crohn disease is a long-lasting (chronic) disease that affects your gastrointestinal (GI) tract. It often causes irritation and swelling (inflammation) in your small intestine and the beginning of your large intestine. However, it can affect any part of your GI tract. Crohn disease is part of a group of illnesses that are known as inflammatory bowel disease (IBD).    Crohn disease may start slowly and get worse over time. Symptoms may come and go. They may also disappear for months or even years at a time (remission).    CAUSES    The exact cause of Crohn disease is not known. It may be a response that causes your body's defense system (immune system) to mistakenly attack healthy cells and tissues (autoimmune response). Your genes and your environment may also play a role.    RISK FACTORS    You may be at greater risk for Crohn disease if you:     Have other family members with Crohn disease or another IBD.     Use any tobacco products, including cigarettes, chewing tobacco, or electronic cigarettes.     Are in your 20s.     Have Eastern European ancestry.    SIGNS AND SYMPTOMS    The main signs and symptoms of Crohn disease involve your GI tract. These include:     Diarrhea.     Rectal bleeding.     An urgent need to move your bowels.     The feeling that you are not finished having a bowel movement.     Abdominal pain or cramping.     Constipation.    General signs and symptoms of Crohn disease may also include:     Unexplained weight loss.     Fatigue.      Fever.     Nausea.     Loss of appetite.     Joint pain     Changes in vision.     Red bumps on your skin.    DIAGNOSIS    Your health care provider may suspect Crohn disease based on your symptoms and your medical history. Your health care provider will do a physical exam. You may need to see a health care provider who specializes in diseases of the digestive  tract (gastroenterologist). You may also have tests to help your health care providers make a diagnosis. These may include:     Blood tests.     Stool sample tests.     Imaging tests, such as X-rays and CT scans.     Tests to examine the inside of your intestines using a long, flexible tube that has a light and a camera on the end (endoscopy or colonoscopy).     A procedure to take tissue samples from inside your bowel (biopsy) to be examined under a microscope.    TREATMENT    There is no cure for Crohn disease. Treatment will focus on managing your symptoms. Crohn disease affects each person differently. Your treatment may include:     Resting your bowels. Drinking only clear liquids or getting nutrition through an IV for a period of time gives your bowels a chance to heal because they are not passing stools.     Medicines. These may be used alone or in combination (combination therapy). These may include antibiotic medicines. You may be given medicines that help to:    ?   Reduce inflammation.    ? Control your immune system activity.    ? Fight infections.    ? Relieve cramps and prevent diarrhea.    ? Control your pain.     Surgery. You may need surgery if:    ? Medicines and other treatments are no longer working.    ? You develop complications from severe Crohn disease.    ? A section of your intestine becomes so damaged that it needs to be removed.    HOME CARE INSTRUCTIONS     Take medicines only as directed by your health care provider.     If you were prescribed an antibiotic medicine, finish it all even if you start to feel better.     Keep all follow-up visits as directed by your health care provider. This is important.     Talk with your health care provider about changing your diet. This may help your symptoms. Your health care provide may recommend changes, such as:    ? Drinking more fluids.    ? Avoiding milk and other foods that contain lactose.    ? Eating a low-fat diet.    ? Avoiding high-fiber  foods, such as popcorn and nuts.    ? Avoiding carbonated beverages, such as soda.    ? Eating smaller meals more often rather than eating large meals.    ? Keeping a food diary to identify foods that make your symptoms better or worse.     Do not use any tobacco products, including cigarettes, chewing tobacco, or electronic cigarettes. If you need help quitting, ask your health care provider.     Limit alcohol intake to no more than 1 drink per day for nonpregnant women and 2 drinks per day for men. One drink equals 12 ounces of beer, 5 ounces of wine, or 1? ounces of hard liquor.     Exercise daily or as directed by your health care provider.    SEEK MEDICAL CARE IF:     You have diarrhea, abdominal cramps, and other gastrointestinal problems that are present almost all of the time.     Your symptoms do not improve with treatment.     You continue to lose weight.     You develop a rash or sores on your skin.     You develop eye problems.     You have a fever. ?     Your symptoms get worse.     You develop new symptoms.    SEEK IMMEDIATE MEDICAL CARE IF:     You have bloody diarrhea.     You develop severe abdominal pain.     You cannot pass stools.    This information is not intended to replace advice given to you by your health care provider. Make sure you discuss any questions you have with your health care provider.    Document Released: 11/28/2004 Document Revised: 03/11/2014 Document Reviewed: 10/06/2013  Elsevier Interactive Patient Education ?2016 [employer]

## 2018-12-04 NOTE — ED Provider Notes (Signed)
Abdominal pain        Patient:   [name] [name], [name] [name]             MRN: [id number]            FIN: [id number]               Age:   33 years     Sex:  Female     DOB:  [birth year]   Associated Diagnoses:   Abdominal pain in female; Crohn's colitis   Author:   Samuel Bouche C      Basic Information   History source: Patient.   Arrival mode: Private vehicle.   History limitation: None.      History of Present Illness   33 year old female presents to the emergency department complaining of abdominal pain.  She was very recently diagnosed with Crohn's disease.  She was admitted at [facility] the end of last week for anemia from rectal bleeding, and urinary tract infection.  While she was in the hospital she had a colonoscopy and was diagnosed with Crohn's disease.  She was started on prednisone, Pentasa, and Flagyl.  She was also given pain medications and Zofran.  She was seen in follow-up by 1 of the midlevel providers at [facility] on Monday.  She has continued to have some symptoms over the course of this week although her bloody diarrhea seems to have resolved.  She was also seen once more at [facility] and subsequently discharged.  She has not been given much counseling or management of expectations in regards to her Crohn's flare.  She denies any vomiting.  She has been scheduled for an upcoming endoscopy.      Review of Systems             Additional review of systems information: All other systems reviewed and otherwise negative, All systems reviewed as documented in chart.      Health Status   Allergies:    Allergic Reactions (Selected)  Severe  Doxycycline- Anaphylactic reaction.  Levaquin- Anaphylactic reaction..      Past Medical/ Family/ Social History   Past medical/Family/Social history reviewed in patients medical record.       Physical Examination               Vital Signs               Per nurse's notes.   General:  Alert, no acute distress.    Skin:  Warm, dry, pink.     Head:  Normocephalic.   Neck:  Supple.   Ears, nose, mouth and throat:  Oral mucosa moist.   Cardiovascular:  Regular rate and rhythm, Normal peripheral perfusion.    Respiratory:  Respirations are non-labored.   Gastrointestinal:  Soft, Non distended, Tenderness: Mild, generalized, Guarding: Negative, Rebound: Negative.    Musculoskeletal:  No tenderness, no swelling.    Neurological:  Alert and oriented to person, place, time, and situation, No focal neurological deficit observed.    Lymphatics:  No lymphadenopathy.   Psychiatric:  Cooperative, appropriate mood & affect.       Medical Decision Making   Documents reviewed:  Emergency department nurses' notes.   Notes:  Patient has clearly improved from her recent hospitalization as her bloody diarrhea has resolved.  She displays no evidence of anemia or infection tonight.  She is afebrile.  She has scheduled follow-up with gastroenterology..      Impression and Plan  Diagnosis   Abdominal pain in female (ICD10-CM R10.9, Discharge, Medical)   Crohn's colitis (ICD10-CM K50.10, Discharge, Medical)   Plan   Condition: Improved, Stable.    Disposition: Discharged: Time  12/04/2018 19:33:00, to home.    Prescriptions: [facility] prescriptions   Pharmacy:  promethazine 25 mg oral tablet (Prescribe): 25 mg, 1 tabs, Oral, q6hr, for 5 days, PRN: nausea/vomiting, 20 tabs, 0 Refill(s).    Patient was given the following educational materials: Crohn Disease.    Follow up with: [facility] Within 1 week.    Counseled: Patient, Friend, Regarding diagnosis, Regarding diagnostic results, Regarding treatment plan, Regarding prescription, Patient indicated understanding of instructions.    Notes: I had a detailed discussion with the patient regarding today's presentation and evaluation and physical exam findings. I explained to the patient the diagnosis based on these factors. I explained the likely time frame and expected course. I gave detailed instructions on  signs and symptoms to be monitored, with precautions to return for any new, unexpected or worsening symptoms.Armed forces training and education officer Signed on 12/04/2018 07:47 PM EDT   ________________________________________________   Rise Mu      Electronically Signed on 12/04/2018 07:55 PM EDT   ________________________________________________   Wenda Overland, Braeden Dolinski C            Modified by: Wenda Overland, Triston Lisanti C on 12/04/2018 07:55 PM EDT

## 2018-12-04 NOTE — ED Notes (Signed)
 ED Triage Note       ED Triage Adult Entered On:  12/04/2018 17:54 EDT    Performed On:  12/04/2018 17:49 EDT by Janyth, RN, Elyn CROME               Triage   Chief Complaint :   reports hx crohns. states she was recently admitted for anemia, colitis, and sepsis at [facility]. states she was transported by EMS last night to [facility] for turning blue   states she feels she was sent home too soon   Numeric Rating Pain Scale :   10 = Worst possible pain   [place] Mode of Arrival :   Wheelchair   Infectious Disease Documentation :   Document assessment   Patient received chemo or biotherapy last 48 hrs? :   No   Temperature Oral :   36.9 degC(Converted to: 98.4 degF)    Heart Rate Monitored :   116 bpm (HI)    Respiratory Rate :   18 br/min   Systolic Blood Pressure :   124 mmHg   Diastolic Blood Pressure :   82 mmHg   SpO2 :   98 %   Oxygen Therapy :   Room air   Patient presentation :   HR > 100   Chief Complaint or Presentation suggest infection :   No   Dosing Weight Obtained By :   Patient stated   Weight Dosing :   95 kg(Converted to: 209 lb 7 oz)    Height :   161 cm(Converted to: 5 ft 3 in)    Body Mass Index Dosing :   37 kg/m2   Janyth OBIE Elyn CROME - 12/04/2018 17:49 EDT   DCP GENERIC CODE   Tracking Acuity :   3   Tracking Group :   ED [employer]   [place], RN, Elyn CROME - 12/04/2018 17:49 EDT   ED General Section :   Document assessment   Pregnancy Status :   Patient denies   ED Allergies Section :   Document assessment   ED Reason for Visit Section :   Document assessment   ED Home Meds Section :   Document assessment   ED Quick Assessment :   Patient appears awake, alert, oriented to baseline. Skin warm and dry. Moves all extremities. Respiration even and unlabored. Appears in no apparent distress.   [place], RN, Elyn CROME - 12/04/2018 17:49 EDT   ID Risk Screen Symptoms   Recent Travel History :   No recent travel   Close Contact with COVID-19 ID :   No   Last 14 days COVID-19 ID :   No   TB Symptom Screen  :   No symptoms   C. diff Symptom/History ID :   Neither of the above   [place], RN, [place] L - 12/04/2018 17:49 EDT   Allergies   (As Of: 12/04/2018 17:54:12 EDT)   Allergies (Active)   doxycycline  Estimated Onset Date:   Unspecified ; Reactions:   Anaphylactic reaction ; Created By:   Janyth, RN, Krystle L; Reaction Status:   Active ; Category:   Drug ; Substance:   doxycycline ; Type:   Allergy ; Severity:   Severe ; Updated By:   Janyth, RN, Elyn CROME; Reviewed Date:   12/04/2018 17:51 EDT      Levaquin  Estimated Onset Date:   Unspecified ; Reactions:   Anaphylactic reaction ; Created By:  Janyth, RN, Elyn L; Reaction Status:   Active ; Category:   Drug ; Substance:   Levaquin ; Type:   Allergy ; Severity:   Severe ; Updated By:   Janyth RN, Elyn CROME; Reviewed Date:   12/04/2018 17:51 EDT        Psycho-Social   Last 3 mo, thoughts killing self/others :   Patient denies   Janyth OBIE Elyn CROME - 12/04/2018 17:49 EDT   ED Home Med List   Medication List   (As Of: 12/04/2018 17:54:12 EDT)   Unable To Meryl Janyth, RN, Elyn CROME - 12/04/2018 17:52:20           ED Reason for Visit   (As Of: 12/04/2018 17:54:12 EDT)   Problems(Active)    Crohn disease (SNOMED CT  :[id number] )  Name of Problem:   Crohn disease ; Recorder:   Janyth, RN, Elyn CROME; Confirmation:   Confirmed ; Classification:   Patient Stated ; Code:   [id number] ; Contributor System:   PowerChart ; Last Updated:   12/04/2018 17:50 EDT ; Life Cycle Date:   12/04/2018 ; Life Cycle Status:   Active ; Vocabulary:   SNOMED CT          Diagnoses(Active)    Abdominal pain  Date:   12/04/2018 ; Diagnosis Type:   Reason For Visit ; Confirmation:   Complaint of ; Clinical Dx:   Abdominal pain ; Classification:   Medical ; Clinical Service:   Non-Specified ; Code:   PNED ; Probability:   0 ; Diagnosis Code:   [id number]      Chills  Date:   12/04/2018 ; Diagnosis Type:   Reason For Visit ; Confirmation:   Complaint of ; Clinical Dx:   Chills  ; Classification:   Medical ; Clinical Service:   Non-Specified ; Code:   PNED ; Probability:   0 ; Diagnosis Code:   [id number]      Nausea  Date:   12/04/2018 ; Diagnosis Type:   Reason For Visit ; Confirmation:   Complaint of ; Clinical Dx:   Nausea ; Classification:   Medical ; Clinical Service:   Non-Specified ; Code:   PNED ; Probability:   0 ; Diagnosis Code:   [id number]      Vomiting  Date:   12/04/2018 ; Diagnosis Type:   Reason For Visit ; Confirmation:   Complaint of ; Clinical Dx:   Vomiting ; Classification:   Medical ; Clinical Service:   Non-Specified ; Code:   PNED ; Probability:   0 ; Diagnosis Code:   [id number]

## 2018-12-04 NOTE — ED Notes (Signed)
ED Note-Nursing       ED RN Reassessment Entered On:  12/04/2018 20:05 EDT    Performed On:  12/04/2018 20:01 EDT by Teryl Lucy, RN, Katrina A               ED RN Reassessment   ED RN Misc Notes :   pt expressing concerns that was still in pain and pt informed to return if pain cont otherwise she needs to follow up w/ GI md notified of pt pain , toradol ordered for pt. pt given toradol.pt scremaing at this rn requesting that this rn remove IV.    Teryl Lucy, RN, Katrina A - 12/04/2018 20:01 EDT

## 2018-12-04 NOTE — Discharge Summary (Signed)
ED Clinical Summary                         [facility]  [address]  [place], [place] [address]  [phone]           PERSON INFORMATION  Name: [name] [name], [name] [name] Age:  33 Years DOB: May 03, 1985   Sex: Female Language: English PCP:    Marital Status:  Single Phone: [phone] Med Service: MED-Medicine   MRN:  [id number] Acct# [id number] Arrival: 12/04/2018 17:44:00   Visit Reason: Abdominal pain; Chills; Abdominal pain; Vomiting; Nausea; ABD PAIN/EMESIS Acuity: 3 LOS: 000 02:18   Address:      [address] [place] [address]  Diagnosis:      Abdominal pain in female; Crohn's colitis  Printed Prescriptions:            Allergies      doxycycline (Anaphylactic reaction)      Levaquin (Anaphylactic reaction)      Medications Administered During Visit:                  Medication Dose Route   Sodium Chloride 0.9% 1000 mL IV Piggyback   Sodium Chloride 0.9% 1000 mL IV Piggyback   ketorolac 15 mg IV Push       Patient Medication List:              promethazine (promethazine 25 mg oral tablet) 1 Tabs Oral (given by mouth) every 6 hours as needed nausea/vomiting for 5 Days. Refills: 0.         Major Tests and Procedures:  The following procedures and tests were performed during your ED visit.  COMMONPROCEDURES%>  COMMON PROCEDURESCOMMENTS%>          Laboratory Orders  Name Status Details   .UA POC Completed Urine, RT, RT - Routine, Collected, 12/04/18 18:42:00 EDT, Nurse collect, 12/04/18 18:42:00 US/Eastern, RAL POC Login   BMP Completed Blood, Stat, ST - Stat, 12/04/18 18:28:00 EDT, 12/04/18 18:28:00 EDT, Nurse collect, TURNER-MD, JR, RONALD C, Print label Y/N   CBCDIFF Completed Blood, Stat, ST - Stat, 12/04/18 18:28:00 EDT, 12/04/18 18:28:00 EDT, Nurse collect, TURNER-MD, JR, RONALD C, Print label Y/N   Hepatic Completed Blood, Stat, ST - Stat, 12/04/18 18:28:00 EDT, 12/04/18 18:28:00 EDT, Nurse collect, TURNER-MD, JR, RONALD C, Print label Y/N   Lactic Completed Blood,  Stat, ST - Stat, 12/04/18 18:28:00 EDT, 12/04/18 18:28:00 EDT, Nurse collect, TURNER-MD, JR, RONALD C, Print label Y/N   Lipase Lvl Completed Blood, Stat, ST - Stat, 12/04/18 18:28:00 EDT, 12/04/18 18:28:00 EDT, Nurse collect, TURNER-MD, JR, RONALD C, Print label Y/N   XTube Blue Completed Blood, Stat, ST - Stat, Collected, 12/04/18 18:33:00 EDT NIEMHA, 12/04/18 18:33:00 EDT, Nurse collect, Venous Draw, 12/04/18 19:33:00 EDT, BH CP Login, TURNER-MD, JR, RONALD C, Print label Y/N, [id number], 1.8 mL Blue/*[id number]*/, Complete               Radiology Orders  No radiology orders were placed.              Patient Care Orders  Name Status Details   Discharge Patient Ordered 12/04/18 19:47:00 EDT   ED Assessment Adult Completed 12/04/18 17:54:13 EDT, 12/04/18 17:54:13 EDT   ED Secondary Triage Completed 12/04/18 17:54:13 EDT, 12/04/18 17:54:13 EDT   ED Triage Adult Completed 12/04/18 17:44:15 EDT, 12/04/18 17:44:15 EDT   POC-Urine Dipstick collect Completed 12/04/18 18:28:00 EDT, Once, 12/04/18 18:28:00 EDT  Saline Lock Insert Completed 12/04/18 18:28:00 EDT, Once, 12/04/18 18:28:00 EDT             PROVIDER INFORMATION               Provider Role Assigned Carma LeavenUnassigned   TURNER-MD, JR, RONALD C ED Provider 12/04/2018 18:24:38    Pershing CoxNieman, RN, Hannah ED Nurse 12/04/2018 18:29:47 12/04/2018 19:11:43   Dareen PianoAnderson, RN, Lyla Sonarrie ED Nurse 12/04/2018 19:16:12        Attending Physician:  Wenda OverlandURNER-MD, JR, RONALD C     Admit Doc  TURNER-MD, JR, RONALD C     Consulting Doc       VITALS INFORMATION  Vital Sign Triage Latest   Temp Oral ORAL_1%>36.9 degC ORAL%>36.8 degC   Temp Temporal TEMPORAL_1%> TEMPORAL%>   Temp Intravascular INTRAVASCULAR_1%> INTRAVASCULAR%>   Temp Axillary AXILLARY_1%> AXILLARY%>   Temp Rectal RECTAL_1%> RECTAL%>   02 Sat 98 % 99 %   Respiratory Rate RATE_1%>18 br/min RATE%>18 br/min   Peripheral Pulse Rate PULSE RATE_1%> PULSE RATE%>   Apical Heart Rate HEART RATE_1%> HEART RATE%>   Blood Pressure BLOOD PRESSURE_1%>/  BLOOD PRESSURE_1%>82 mmHg BLOOD PRESSURE%>121 mmHg / BLOOD PRESSURE%>79 mmHg                 Immunizations      No Immunizations Documented This Visit          DISCHARGE INFORMATION   Discharge Disposition: H Outpt-Sent Home   Discharge Location:    Home   Discharge Date and Time:    12/04/2018 20:02:00   ED Checkout Date and Time:    12/04/2018 20:02:00     DEPART REASON INCOMPLETE INFORMATION               Depart Action Incomplete Reason   Interactive View/I&O Recently assessed   Patient Understanding MD Decision to leave incomplete               Problems      No Problems Documented              Smoking Status      5-9 cigarettes (between 1/4 to 1/2 pack)/day in last 30 days         PATIENT EDUCATION INFORMATION  Instructions:       Crohn Disease     Follow up:                    With: Address: When:   [facility] [place], [address], [address] [place], [place] [id number]  [phone] Business (1) Within 1 week           ED PROVIDER DOCUMENTATION     Patient:   [name] BlakeEPIN, Keishawn             MRN: [id number]            FIN: [id number]               Age:   [age] years     Sex:  Female     DOB:  June 24, 1985   Associated Diagnoses:   Abdominal pain in female; Crohn's colitis   Author:   Samuel BoucheURNER-MD, JR, RONALD C      Basic Information   History source: Patient.   Arrival mode: Private vehicle.   History limitation: None.      History of Present Illness   33 year old female presents to the emergency department complaining of abdominal pain.  She was very recently  diagnosed with Crohn's disease.  She was admitted at [facility] the end of last week for anemia from rectal bleeding, and urinary tract infection.  While she was in the hospital she had a colonoscopy and was diagnosed with Crohn's disease.  She was started on prednisone, Pentasa, and Flagyl.  She was also given pain medications and Zofran.  She was seen in follow-up by 1 of the midlevel providers at [facility] on Monday.  She has continued to have some symptoms over the course of this week although her bloody diarrhea seems to have resolved.  She was also seen once more at [facility] and subsequently discharged.  She has not been given much counseling or management of expectations in regards to her Crohn's flare.  She denies any vomiting.  She has been scheduled for an upcoming endoscopy.      Review of Systems             Additional review of systems information: All other systems reviewed and otherwise negative, All systems reviewed as documented in chart.      Health Status   Allergies:    Allergic Reactions (Selected)  Severe  Doxycycline- Anaphylactic reaction.  Levaquin- Anaphylactic reaction..      Past Medical/ Family/ Social History   Past medical/Family/Social history reviewed in patients medical record.       Physical Examination               Vital Signs               Per nurse's notes.   General:  Alert, no acute distress.    Skin:  Warm, dry, pink.    Head:  Normocephalic.   Neck:  Supple.   Ears, nose, mouth and throat:  Oral mucosa moist.   Cardiovascular:  Regular rate and rhythm, Normal peripheral perfusion.    Respiratory:  Respirations are non-labored.   Gastrointestinal:  Soft, Non distended, Tenderness: Mild, generalized, Guarding: Negative, Rebound: Negative.    Musculoskeletal:  No tenderness, no swelling.    Neurological:  Alert and oriented to person, place, time, and situation, No focal neurological deficit observed.    Lymphatics:  No lymphadenopathy.   Psychiatric:  Cooperative, appropriate mood & affect.       Medical Decision Making   Documents reviewed:  Emergency department nurses' notes.   Notes:  Patient has clearly improved from her recent hospitalization as her bloody diarrhea has resolved.  She displays no evidence of anemia or infection tonight.  She is afebrile.  She has scheduled follow-up with gastroenterology..      Impression and Plan   Diagnosis   Abdominal  pain in female (ICD10-CM R10.9, Discharge, Medical)   Crohn's colitis (ICD10-CM K50.10, Discharge, Medical)   Plan   Condition: Improved, Stable.    Disposition: Discharged: Time  12/04/2018 19:33:00, to home.    Prescriptions: [facility] prescriptions   Pharmacy:  promethazine 25 mg oral tablet (Prescribe): 25 mg, 1 tabs, Oral, q6hr, for 5 days, PRN: nausea/vomiting, 20 tabs, 0 Refill(s).    Patient was given the following educational materials: Crohn Disease.    Follow up with: [place] Within 1 week.    Counseled: Patient, Friend, Regarding diagnosis, Regarding diagnostic results, Regarding treatment plan, Regarding prescription, Patient indicated understanding of instructions.    Notes: I had a detailed discussion with the patient regarding today's presentation and evaluation and physical exam findings. I explained to the patient the diagnosis based  on these factors. I explained the likely time frame and expected course. I gave detailed instructions on signs and symptoms to be monitored, with precautions to return for any new, unexpected or worsening symptoms.Marland Kitchen

## 2018-12-04 NOTE — ED Notes (Signed)
 ED Patient Summary       ;          [facility]  [address] [place], [place] [address]  [phone]  Discharge Instructions (Patient)  _______________________________________     Name:[name] [name]  DOB:  01/04/1986                   MRN: [id number]                   FIN: [id number]  Reason For Visit: Abdominal pain; Chills; Abdominal pain; Vomiting; Nausea; ABD PAIN/EMESIS  Final Diagnosis: Abdominal pain in female; Crohn's colitis     Visit Date: 12/04/2018 17:44:00  Address: [address] [facility] [address] [place] [address]  Phone: [phone]     Primary Care Provider:      Name:       Phone:         Emergency Department Providers:         Primary Physician:   WILMOT MICKEY TANDA JAYSON [facility] would like to thank you for allowing us  to assist you with your healthcare needs. The following includes patient education materials and information regarding your injury/illness.     Follow-up Instructions:  You were treated today on an emergency basis. If instructed, please contact your primary care provider to arrange for follow-up and for any further concerns. Whether you have been referred to your primary care doctor or a specialist, please follow-up as instructed.      If you do not have a doctor, you may call [phone] for assistance with finding a [facility] care physician or specialist. Staff is available to help schedule you an appointment.      Not sure where to go with questions about your health? We're here for you. The [facility] "Ask a Nurse" Line in staffed by experienced nurses and is a free service to the community, available Monday - Friday from 8AM to 5PM. Call [phone].      If your condition worsens before your follow-up with an outpatient physician, please return to the Emergency Department.              With: Address: When:   [facility], [address], [address] [place], [place] [address]  [phone] Business (1) Within 1 week              Printed Prescriptions:    Patient Education Materials:  Discharge Orders          Discharge Patient 12/04/18 19:47:00 EDT         Comment:      Crohn Disease     Crohn Disease    Crohn disease is a long-lasting (chronic) disease that affects your gastrointestinal (GI) tract. It often causes irritation and swelling (inflammation) in your small intestine and the beginning of your large intestine. However, it can affect any part of your GI tract. Crohn disease is part of a group of illnesses that are known as inflammatory bowel disease (IBD).    Crohn disease may start slowly and get worse over time. Symptoms may come and go. They may also disappear for months or even years at a time (remission).    CAUSES    The exact cause of Crohn disease is not known. It may be a response that causes your body's  defense system (immune system) to mistakenly attack healthy cells and tissues (autoimmune response). Your genes and your environment may also play a role.    RISK FACTORS    You may be at greater risk for Crohn disease if you:     Have other family members with Crohn disease or another IBD.     Use any tobacco products, including cigarettes, chewing tobacco, or electronic cigarettes.     Are in your 65s.     Have [place] European ancestry.    SIGNS AND SYMPTOMS    The main signs and symptoms of Crohn disease involve your GI tract. These include:     Diarrhea.     Rectal bleeding.     An urgent need to move your bowels.     The feeling that you are not finished having a bowel movement.     Abdominal pain or cramping.     Constipation.    General signs and symptoms of Crohn disease may also include:     Unexplained weight loss.     Fatigue.      Fever.     Nausea.     Loss of appetite.     Joint pain     Changes in vision.     Red bumps on your skin.    DIAGNOSIS    Your health care provider may suspect Crohn  disease based on your symptoms and your medical history. Your health care provider will do a physical exam. You may need to see a health care provider who specializes in diseases of the digestive tract (gastroenterologist). You may also have tests to help your health care providers make a diagnosis. These may include:     Blood tests.     Stool sample tests.     Imaging tests, such as X-rays and CT scans.     Tests to examine the inside of your intestines using a long, flexible tube that has a light and a camera on the end (endoscopy or colonoscopy).     A procedure to take tissue samples from inside your bowel (biopsy) to be examined under a microscope.    TREATMENT    There is no cure for Crohn disease. Treatment will focus on managing your symptoms. Crohn disease affects each person differently. Your treatment may include:     Resting your bowels. Drinking only clear liquids or getting nutrition through an IV for a period of time gives your bowels a chance to heal because they are not passing stools.     Medicines. These may be used alone or in combination (combination therapy). These may include antibiotic medicines. You may be given medicines that help to:    ? Reduce inflammation.    ? Control your immune system activity.    ? Fight infections.    ? Relieve cramps and prevent diarrhea.    ? Control your pain.     Surgery. You may need surgery if:    ? Medicines and other treatments are no longer working.    ? You develop complications from severe Crohn disease.    ? A section of your intestine becomes so damaged that it needs to be removed.    HOME CARE INSTRUCTIONS     Take medicines only as directed by your health care provider.     If you were prescribed an antibiotic medicine, finish it all even if you start to feel better.     Keep all follow-up visits  as directed by your health care provider. This is important.     Talk with your health care provider about changing your diet. This may help your symptoms.  Your health care provide may recommend changes, such as:    ? Drinking more fluids.    ? Avoiding milk and other foods that contain lactose.    ? Eating a low-fat diet.    ? Avoiding high-fiber foods, such as popcorn and nuts.    ? Avoiding carbonated beverages, such as soda.    ? Eating smaller meals more often rather than eating large meals.    ? Keeping a food diary to identify foods that make your symptoms better or worse.     Do not use any tobacco products, including cigarettes, chewing tobacco, or electronic cigarettes. If you need help quitting, ask your health care provider.     Limit alcohol intake to no more than 1 drink per day for nonpregnant women and 2 drinks per day for men. One drink equals 12 ounces of beer, 5 ounces of wine, or 1? ounces of hard liquor.     Exercise daily or as directed by your health care provider.    SEEK MEDICAL CARE IF:     You have diarrhea, abdominal cramps, and other gastrointestinal problems that are present almost all of the time.     Your symptoms do not improve with treatment.     You continue to lose weight.     You develop a rash or sores on your skin.     You develop eye problems.     You have a fever. ?     Your symptoms get worse.     You develop new symptoms.    SEEK IMMEDIATE MEDICAL CARE IF:     You have bloody diarrhea.     You develop severe abdominal pain.     You cannot pass stools.    This information is not intended to replace advice given to you by your health care provider. Make sure you discuss any questions you have with your health care provider.    Document Released: 11/28/2004 Document Revised: 03/11/2014 Document Reviewed: 10/06/2013  Elsevier Interactive Patient Education ?2016 [employer] Inc.         Allergy Info: Levaquin; doxycycline     Medication Information:  [facility] ED Physicians provided you with a complete list of medications post discharge, if you have been instructed to stop taking a medication please ensure  you also follow up with this information to your Primary Care Physician.  Unless otherwise noted, patient will continue to take medications as prescribed prior to the Emergency Room visit.  Any specific questions regarding your chronic medications and dosages should be discussed with your physician(s) and pharmacist.          promethazine  (promethazine  25 mg oral tablet) 1 Tabs Oral (given by mouth) every 6 hours as needed nausea/vomiting for 5 Days. Refills: 0.      Medications Administered During Visit:              Medication Dose Route   Sodium Chloride  0.9% 1000 mL IV Piggyback   Sodium Chloride  0.9% 1000 mL IV Piggyback   ketorolac  15 mg IV Push          Major Tests and Procedures:  The following procedures and tests were performed during your ED visit.  PROCEDURES%>  PROCEDURES COMMENTS%>  Laboratory Orders  Name Status Details   .UA POC Completed Urine, RT, RT - Routine, Collected, 12/04/18 18:42:00 EDT, Nurse collect, 12/04/18 18:42:00 US [place], RAL POC Login   BMP Completed Blood, Stat, ST - Stat, 12/04/18 18:28:00 EDT, 12/04/18 18:28:00 EDT, Nurse collect, TURNER-MD, JR, RONALD C, Print label Y/N   CBCDIFF Completed Blood, Stat, ST - Stat, 12/04/18 18:28:00 EDT, 12/04/18 18:28:00 EDT, Nurse collect, TURNER-MD, JR, RONALD C, Print label Y/N   Hepatic Completed Blood, Stat, ST - Stat, 12/04/18 18:28:00 EDT, 12/04/18 18:28:00 EDT, Nurse collect, TURNER-MD, JR, RONALD C, Print label Y/N   Lactic Completed Blood, Stat, ST - Stat, 12/04/18 18:28:00 EDT, 12/04/18 18:28:00 EDT, Nurse collect, TURNER-MD, JR, RONALD C, Print label Y/N   Lipase Lvl Completed Blood, Stat, ST - Stat, 12/04/18 18:28:00 EDT, 12/04/18 18:28:00 EDT, Nurse collect, TURNER-MD, JR, RONALD C, Print label Y/N   XTube Blue Completed Blood, Stat, ST - Stat, Collected, 12/04/18 18:33:00 EDT NIEMHA, 12/04/18 18:33:00 EDT, Nurse collect, Venous Draw, 12/04/18 19:33:00 EDT, BH CP Login, TURNER-MD, JR, RONALD C, Print label Y/N,  [id number], 1.8 mL Blue/*[id number]*/, Complete               Radiology Orders  No radiology orders were placed.              Patient Care Orders  Name Status Details   Discharge Patient Ordered 12/04/18 19:47:00 EDT   ED Assessment Adult Completed 12/04/18 17:54:13 EDT, 12/04/18 17:54:13 EDT   ED Secondary Triage Completed 12/04/18 17:54:13 EDT, 12/04/18 17:54:13 EDT   ED Triage Adult Completed 12/04/18 17:44:15 EDT, 12/04/18 17:44:15 EDT   POC-Urine Dipstick collect Completed 12/04/18 18:28:00 EDT, Once, 12/04/18 18:28:00 EDT   Saline Lock Insert Completed 12/04/18 18:28:00 EDT, Once, 12/04/18 18:28:00 EDT       ---------------------------------------------------------------------------------------------------------------------  [facility]) encourages you to self-enroll in the [facility] Patient Portal.  [facility] Patient Portal will allow you to manage your personal health information securely from your own electronic device now and in the future.  To begin your Patient Portal enrollment process, please visit [email or web address] Click on "Sign up now" under [facility].  If you find that you need additional assistance on the [facility] Patient Portal or need a copy of your medical records, please call the [facility] Medical Records Office at [phone].  Comment:

## 2019-04-20 NOTE — Nursing Note (Signed)
Adult Admission Assessment - Text       Perioperative Admission Assessment Entered On:  04/20/2019 16:00 EST    Performed On:  04/20/2019 15:37 EST by Arvilla Meres, RN, Herminie   Call Start :   04/20/2019 15:41 EST   Call Complete :   04/20/2019 16:05 EST   Information Given By :   Self   Height/Length Estimated :   160.02 cm(Converted to: 5 ft 3 in, 5.25 ft, 63.00 in)    BMI   Estimated :   98.88 kg(Converted to: 218 lb 0 oz, 217.993 lb)    Body Mass Index Estimated :   38.62 kg/m2   Primary Care Physician/Specialists :   [facility]  DR. PATEL, GI   Emergency Contact Name :   Simonne Maffucci, AUNT   Emergency Contact Phone :   [phone]   Languages :   Doristine Locks, RN, Dreama Saa - 04/20/2019 15:37 EST   Allergies   (As Of: 04/23/2019 10:31:51 EST)   Allergies (Active)   doxycycline  Estimated Onset Date:   Unspecified ; Reactions:   Anaphylactic reaction ; Created By:   Belenda Cruise, RN, Ross Ludwig; Reaction Status:   Active ; Category:   Drug ; Substance:   doxycycline ; Type:   Allergy ; Severity:   Severe ; Updated By:   Belenda Cruise RN, Ross Ludwig; Reviewed Date:   04/23/2019 10:26 EST      Levaquin  Estimated Onset Date:   Unspecified ; Reactions:   Anaphylactic reaction ; Created By:   Belenda Cruise, RN, Ross Ludwig; Reaction Status:   Active ; Category:   Drug ; Substance:   Levaquin ; Type:   Allergy ; Severity:   Severe ; Updated By:   Belenda Cruise, RN, Ross Ludwig; Reviewed Date:   04/23/2019 10:26 EST        Medication History   Medication List   (As Of: 04/23/2019 10:31:51 EST)   Normal Order    Lactated Ringers Injection solution 1,000 mL  :   Lactated Ringers Injection solution 1,000 mL ; Status:   Ordered ; Ordered As Mnemonic:   Lactated Ringers Injection 1,000 mL ; Simple Display Line:   10 mL/hr, IV ; Ordering Provider:   FORD-MD,  BENJAMIN R; Catalog Code:   Lactated Ringers Injection ; Order Dt/Tm:   04/23/2019 10:14:40 EST ; Comment:   Perioperative use ONLY  For Non Dialysis Patient          Sodium  Chloride 0.9% intravenous solution 500 mL  :   Sodium Chloride 0.9% intravenous solution 500 mL ; Status:   Ordered ; Ordered As Mnemonic:   Sodium Chloride 0.9% 500 mL ; Simple Display Line:   10 mL/hr, IV ; Ordering Provider:   FORD-MD,  BENJAMIN R; Catalog Code:   Sodium Chloride 0.9% ; Order Dt/Tm:   04/23/2019 10:14:40 EST ; Comment:   Perioperative use ONLY  For Dialysis Patient          sodium chloride 0.9% Inj Soln 10 mL syringe  :   sodium chloride 0.9% Inj Soln 10 mL syringe ; Status:   Ordered ; Ordered As Mnemonic:   sodium chloride 0.9% flush syringe range dose ; Simple Display Line:   30 mL, IV Push, q8hr ; Ordering Provider:   FORD-MD,  BENJAMIN R; Catalog Code:   sodium chloride flush ; Order Dt/Tm:   04/23/2019  10:15:43 EST          A Patient Specific Medication  :   A Patient Specific Medication ; Status:   Ordered ; Ordered As Mnemonic:   A Patient Specific Medication ; Simple Display Line:   1 EA, Kit-Combo, q59mn, PRN: other (see comment) ; Ordering Provider:   FORD-MD,  BAlfonso Patten Catalog Code:   A Patient Specific Medication ; Order Dt/Tm:   04/23/2019 10:15:43 EST          A Patient Specific Refrigerated Medication  :   A Patient Specific Refrigerated Medication ; Status:   Ordered ; Ordered As Mnemonic:   A Patient Specific Refrigerated Medication ; Simple Display Line:   1 EA, Kit-Combo, q574m, PRN: other (see comment) ; Ordering Provider:   FORD-MD,  BEAlfonso PattenCatalog Code:   A Patient Specific Refrigerated Medicati ; Order Dt/Tm:   04/23/2019 10:15:43 EST ; Comment:   to access the patient specific Refrigerated medications          Delivery and Return Bi[place]ccess  :   Delivery and Return Bi[place]ccess ; Status:   Ordered ; Ordered As Mnemonic:   Delivery and Return Bin Access ; Simple Display Line:   1 EA, Kit-Combo, q5m12m PRN: other (see comment) ; Ordering Provider:   FORCrista Curbatalog Code:   Delivery and Return Bin Access ; Order Dt/Tm:   04/23/2019 10:15:43 EST ; Comment:    This code grants access to the [employer] the Delivery and Return Bin Access          lidocaine 1% PF Inj Soln 2 mL  :   lidocaine 1% PF Inj Soln 2 mL ; Status:   Ordered ; Ordered As Mnemonic:   lidocaine 1% preservative-free injectable solution ; Simple Display Line:   0.25 mL, ID, q5mi26mPRN: other (see comment) ; Ordering Provider:   FORDCrista Curbtalog Code:   lidocaine ; Order Dt/Tm:   04/23/2019 10:15:43 EST ; Comment:   to access lidocaine 1%  2 mL vial for IV start and Life Port access          Respiratory MDI Treatment  :   Respiratory MDI Treatment ; Status:   Ordered ; Ordered As Mnemonic:   Respiratory MDI Treatment ; Simple Display Line:   1 EA, Kit-Combo, q5min22mRN: other (see comment) ; Ordering Provider:   FORD-Crista Curbalog Code:   Respiratory MDI Treatment ; Order Dt/Tm:   04/23/2019 10:15:43 EST          sodium chloride 0.9% Inj Soln 10 mL syringe  :   sodium chloride 0.9% Inj Soln 10 mL syringe ; Status:   Ordered ; Ordered As Mnemonic:   sodium chloride 0.9% flush syringe range dose ; Simple Display Line:   30 mL, IV Push, q5min,51mN: other (see comment) ; Ordering Provider:   FORD-MD,  BENJAMAlfonso Pattenlog Code:   sodium chloride flush ; Order Dt/Tm:   04/23/2019 10:15:43 EST          sodium chloride 0.9% Inj Soln 10 mL vial PF  :   sodium chloride 0.9% Inj Soln 10 mL vial PF ; Status:   Ordered ; Ordered As Mnemonic:   sodium chloride 0.9% vial for reconstitution range dose ; Simple Display Line:   30 mL, IV Push, q5min, 23m: other (see comment) ; Ordering Provider:   FORD-MD,  BENJAMIAlfonso Pattenog Code:  sodium chloride flush ; Order Dt/Tm:   04/23/2019 10:15:43 EST ; Comment:   for access to sodium chloride 0.9% vial when needed as a diluent for reconstitutable medications          sterile water Inj Soln 10 mL  :   sterile water Inj Soln 10 mL ; Status:   Ordered ; Ordered As Mnemonic:   sterile water for reconstitution ; Simple Display Line:   10 mL,  N/A, q40mn, PRN: other (see comment) ; Ordering Provider:   FORD-MD,  BAlfonso Patten Catalog Code:   sterile water for reconstitution ; Order Dt/Tm:   04/23/2019 10:15:43 EST ; Comment:   Access sterile water when needed as a diluent for reconstitutable medications. Not for IV use.          acetaminophen 500 mg Tab  :   acetaminophen 500 mg Tab ; Status:   Ordered ; Ordered As Mnemonic:   Tylenol ; Simple Display Line:   1,000 mg, 2 tabs, Oral, On Call ; Ordering Provider:   FORD-MD,  BENJAMIN R; Catalog Code:   acetaminophen ; Order Dt/Tm:   04/23/2019 10:14:40 EST          ceFAZolin duplex  :   ceFAZolin duplex ; Status:   Ordered ; Ordered As Mnemonic:   ceFAZolin IVPB ; Simple Display Line:   2 g, 50 mL, 100 mL/hr, IV Piggyback, On Call ; Ordering Provider:   FORD-MD,  BENJAMIN R; Catalog Code:   ceFAZolin ; Order Dt/Tm:   04/23/2019 10:14:40 EST          gabapentin 300 mg Cap  :   gabapentin 300 mg Cap ; Status:   Ordered ; Ordered As Mnemonic:   Neurontin ; Simple Display Line:   300 mg, 1 caps, Oral, On Call ; Ordering Provider:   FORD-MD,  BENJAMIN R; Catalog Code:   gabapentin ; Order Dt/Tm:   04/23/2019 10:14:40 EST          sincalide + Sodium Chloride 0.9% intravenous solution 100 mL  :   sincalide + Sodium Chloride 0.9% intravenous solution 100 mL ; Status:   Discontinued ; Ordered As Mnemonic:   Kinevac IVPB + Sodium Chloride 0.9% 100 mL ; Simple Display Line:   1.862 mcg, 0.37 EA, 200 mL/hr, IV Piggyback, Once ; Ordering Provider:   PStefanie Libel Catalog Code:   sincalide ; Order Dt/Tm:   04/19/2019 11:12:13 EST ; Comment:   QS NS 1029mminibag  Target Dose: Kinevac IVPB 0.02 mcg/kg  04/19/2019 11:13:01  For Nuclear Med Use Only            Home Meds    ferrous sulfate  :   ferrous sulfate ; Status:   Documented ; Ordered As Mnemonic:   ferrous sulfate 325 mg (65 mg elemental iron) oral delayed release tablet ; Simple Display Line:   325 mg, 1 tabs, Oral, Daily, 0 Refill(s) ; Catalog Code:   ferrous  sulfate ; Order Dt/Tm:   04/20/2019 15:52:25 EST ; Comment:   DO NOT CRUSH          metoclopramide  :   metoclopramide ; Status:   Documented ; Ordered As Mnemonic:   Reglan 10 mg oral tablet ; Simple Display Line:   10 mg, 1 tabs, Oral, TID, 0 Refill(s) ; Catalog Code:   metoclopramide ; Order Dt/Tm:   04/20/2019 15:52:57 EST          cholecalciferol  :  cholecalciferol ; Status:   Documented ; Ordered As Mnemonic:   Vitamin D3 ; Simple Display Line:   1 caps, Oral, Daily, 0 Refill(s) ; Catalog Code:   cholecalciferol ; Order Dt/Tm:   04/20/2019 15:51:54 EST          DULoxetine  :   DULoxetine ; Status:   Documented ; Ordered As Mnemonic:   DULoxetine 60 mg oral delayed release capsule ; Simple Display Line:   60 mg, 1 caps, Oral, qAM, 0 Refill(s) ; Catalog Code:   DULoxetine ; Order Dt/Tm:   04/20/2019 15:53:31 EST          mesalamine  :   mesalamine ; Status:   Documented ; Ordered As Mnemonic:   mesalamine 0.375 g oral capsule, extended release ; Simple Display Line:   1.5 g, 4 cap, Oral, qAM, 0 Refill(s) ; Catalog Code:   mesalamine ; Order Dt/Tm:   04/20/2019 15:53:31 EST          multivitamin with minerals  :   multivitamin with minerals ; Status:   Documented ; Ordered As Mnemonic:   Multi-Day Plus Minerals oral tablet ; Simple Display Line:   1 tabs, Oral, Daily, 0 Refill(s) ; Catalog Code:   multivitamin with minerals ; Order Dt/Tm:   04/20/2019 15:51:33 EST            Problem History   (As Of: 04/23/2019 10:31:51 EST)   Problems(Active)    Anxiety and depression (SNOMED CT  :147829562 )  Name of Problem:   Anxiety and depression ; Recorder:   Arvilla Meres, RN, Dreama Saa; Confirmation:   Confirmed ; Classification:   Patient Stated ; Code:   [id number] ; Contributor System:   PowerChart ; Last Updated:   04/20/2019 15:48 EST ; Life Cycle Date:   04/20/2019 ; Life Cycle Status:   Active ; Vocabulary:   SNOMED CT        Biliary dyskinesia (SNOMED CT  :696295284 )  Name of Problem:   Biliary dyskinesia ; Recorder:   Arvilla Meres, RN,  Dreama Saa; Confirmation:   Confirmed ; Classification:   Patient Stated ; Code:   132440102 ; Contributor System:   Conservation officer, nature ; Last Updated:   04/20/2019 15:47 EST ; Life Cycle Date:   04/20/2019 ; Life Cycle Status:   Active ; Vocabulary:   SNOMED CT        Colitis (SNOMED CT  :725366440 )  Name of Problem:   Colitis ; Recorder:   [name], RN, [name]; Confirmation:   Confirmed ; Classification:   Patient Stated ; Code:   [id number] ; Contributor System:   Conservation officer, nature ; Last Updated:   12/04/2018 18:37 EDT ; Life Cycle Date:   12/04/2018 ; Life Cycle Status:   Active ; Vocabulary:   SNOMED CT        Crohn disease (SNOMED CT  :38756433 )  Name of Problem:   Crohn disease ; Recorder:   Belenda Cruise, RN, Ross Ludwig; Confirmation:   Confirmed ; Classification:   Patient Stated ; Code:   [id number] ; Contributor System:   PowerChart ; Last Updated:   12/04/2018 17:50 EDT ; Life Cycle Date:   12/04/2018 ; Life Cycle Status:   Active ; Vocabulary:   SNOMED CT        Panic attacks (SNOMED CT  :660630160 )  Name of Problem:   Panic attacks ; Recorder:   Arvilla Meres, Therapist, sports, Dreama Saa; Confirmation:   Confirmed ; Classification:   Patient Stated ;  Code:   606301601 ; Contributor System:   Conservation officer, nature ; Last Updated:   04/20/2019 15:49 EST ; Life Cycle Date:   04/20/2019 ; Life Cycle Status:   Active ; Vocabulary:   SNOMED CT        PCOS (polycystic ovarian syndrome) (SNOMED CT  :093235573 )  Name of Problem:   PCOS (polycystic ovarian syndrome) ; Recorder:   Arvilla Meres, RN, Dreama Saa; Confirmation:   Confirmed ; Classification:   Patient Stated ; Code:   [id number] ; Contributor System:   Conservation officer, nature ; Last Updated:   04/20/2019 15:49 EST ; Life Cycle Date:   04/20/2019 ; Life Cycle Status:   Active ; Vocabulary:   SNOMED CT          Procedure History        -    Procedure History   (As Of: 04/23/2019 10:31:51 EST)     Anesthesia Minutes:   0 ; Procedure Name:   Colonoscopy ; Procedure Minutes:   0 ; Last Reviewed Dt/Tm:   04/23/2019 10:27:43 EST            Anesthesia  Minutes:   0 ; Procedure Name:   Tonsillectomy ; Procedure Minutes:   0 ; Last Reviewed Dt/Tm:   04/23/2019 10:27:43 EST            Anesthesia Minutes:   0 ; Procedure Name:   Bogata ; Procedure Minutes:   0 ; Comments:     04/20/2019 15:45 EST - Arvilla Meres, RN, Dreama Saa  BENIGN ; Last Reviewed Dt/Tm:   04/23/2019 10:27:43 EST            Anesthesia Minutes:   0 ; Procedure Name:   D &C ; Procedure Minutes:   0 ; Last Reviewed Dt/Tm:   04/23/2019 10:27:43 EST            Anesthesia Minutes:   0 ; Procedure Name:   Cesarean section ; Procedure Minutes:   0 ; Last Reviewed Dt/Tm:   04/23/2019 10:27:43 EST            Anesthesia Minutes:   0 ; Procedure Name:   EGD - Esophagogastroduodenoscopy ; Procedure Minutes:   0 ; Last Reviewed Dt/Tm:   04/23/2019 10:27:43 EST            History Confirmation   Problem History Changes PAT :   No   Procedure History Changes PAT :   No   Gilford Rile, RN, Arbie Cookey - 04/23/2019 10:26 EST   Anesthesia/Sedation   Anesthesia History :   Prior general anesthesia   SN - Malignant Hyperthermia :   Denies   Previous Problem with Anesthesia :   None   Moderate Sedation History :   Prior sedation for procedure   Previous Problem With Sedation :   None   Symptoms of Sleep Apnea :   BMI greater than 35   Symptoms of Sleep Apnea Score :   1    Shortness of Breath Indicator :   No shortness of breath   Pregnancy Status :   Patient denies   Last Menstrual Period :   04/20/2019 EST   Allison Quarry - 04/20/2019 15:37 EST   Bloodless Medicine   Is Blood Transfusion Acceptable to Patient :   Yes   Arvilla Meres, RN, Dreama Saa - 04/20/2019 15:37 EST   ID Risk Screen Symptoms   Recent Travel History :   No recent travel   Close Contact  with COVID-19 ID :   Preadmission testing patients only   Last 14 days COVID-19 ID :   No   TB Symptom Screen :   No symptoms   C. diff Symptom/History ID :   Neither of the above   [place], RN, Dreama Saa - 04/20/2019 15:37 EST   ID COVID-19 Screen   Fever OR Chills :   No   Headache :   No    New or Worsening Cough :   No   Fatigue :   No   Shortness of Breath ID :   No   Myalgia (Muscle Pain) :   No   Dyspnea :   No   Diarrhea :   No   Sore Throat :   No   Nausea :   No   Laryngitis :   No   Sudden Loss of Taste or Smell :   No   Allison Quarry - 04/20/2019 15:37 EST   Social History   Social History   (As Of: 04/23/2019 10:31:51 EST)   Tobacco:        Tobacco use: Former smoker, quit more than 30 days ago.   Comments:  04/20/2019 15:48 - Allison Quarry: QUIT 02/2019   (Last Updated: 04/20/2019 15:48:22 EST by Arvilla Meres RN, Dreama Saa)          Electronic Cigarette/Vaping:        Never Electronic Cigarette Use.   (Last Updated: 04/20/2019 15:48:07 EST by Arvilla Meres, RN, Dreama Saa)          Alcohol:        Denies   (Last Updated: 12/04/2018 18:36:44 EDT by [name], RN, [name])          Substance Use:        Opioid Naive - not currently taking opioids, Denies   (Last Updated: 04/20/2019 15:47:17 EST by Arvilla Meres, RN, Dreama Saa)            Advance Directive   Advance Directive :   No   Arvilla Meres RN, Dreama Saa - 04/20/2019 15:37 EST   PAT Patient Instructions   Patient Arrival Time PAT :   04/23/2019 10:15 EST   Medications in AM :   REGLAN, DULOXETINE, MESALAMINE (IF OK WITH PRESCRIBING MD)   Medication Understanding :   Verbalizes understanding   NPO PAT :   NPO after midnight   Allison Quarry - 04/20/2019 15:37 EST   PAT Instructions Grid   Make up Understanding :   Rosezena Sensor understanding   Jewelry Understanding :   Verbalizes understanding   Perfume Understanding :   Verbalizes understanding   Valuables Understanding :   Verbalizes understanding   Clothing Understanding :   Emergency planning/management officer MD for Illness :   Emergency planning/management officer MD for skin injury :   Verbalizes understanding   Arvilla Meres, RN, Dreama Saa - 04/20/2019 15:37 EST   Service Line PAT :   Gen Surg   Laterality PAT :   N/A   Prep PAT :   Hibiclens   Name of Contact PAT :   STEFON GAMBRELL   Relationship of Contact PAT :   BOYFRIEND   Contact  Number PAT :   [phone]   Transportation Instructions PAT :   Accompany to [place] with 24 hours post-procedure   Allison Quarry - 04/20/2019 15:37 EST   Additional Contacts PAT :  COVID BD TEST 2/17  covid rescreen negative   Patterson Hammersmith - 04/22/2019 12:51 EST     Harm Screen   Injuries/Abuse/Neglect in Household :   Denies   Feels Unsafe at Home :   No   Agency(s)/Others notified :   No   Last 3 mo, thoughts killing self/others :   Patient denies   Josephine Cables - 04/23/2019 10:26 EST

## 2019-04-22 LAB — COVID-19: SARS COV2, NAA (BD): NOT DETECTED

## 2019-04-23 LAB — PREGNANCY, URINE: Pregnancy, Urine: NEGATIVE

## 2019-04-23 NOTE — Case Communication (Signed)
Discharge Follow-Up Form - Text       Discharge Follow-Up Entered On:  04/26/2019 8:15 EST    Performed On:  04/26/2019 8:13 EST by Gabriel Cirri, RN, Curt Jews               Clinical   Provider Follow-Up Post Discharge RTF :   Follow-Up Appointments    With:  [facility] FORD-MD  Address:  business ([address]), [address], [place], [place], [address];[phone] Business (1);, [address], [place], [place], [address];[phone]  When:  In 2 weeks  Comment:  Call for followup appointment       Macchio, RN, Curt Jews - 04/26/2019 8:13 EST   Surgery Evaluation   CM Surg DC Instr Clr :   Yes   CM Surg Pain Controlled :   Yes   CM Surg Nausea/Vomiting :   No   CM Surg FU Appt :   Yes   CM Surg Staff Recognize :   Yes   CM Surg Staff Recognize Names :   all the staff was great   Daryel November 04/26/2019 8:13 EST   Status   Case Status :   Completed   Macchio, RNCurt Jews - 04/26/2019 8:13 EST

## 2019-04-23 NOTE — Progress Notes (Signed)
Progress Note-Nurse               transport in to transport pt for discharge. Per transport pt jumped out of wheelchair. Pt in hall in front of wheelchair with extreme anxiety and c/o pain in back that comes and goes.  Verbal encouragement given and decrease in anxiety noted. pt states she does not have anxiety meds at home. walked pt back to pacu to [place] [facility]. pt encouraged to sit and relax. Dr. Orlin Hilding called to bedside to converse with pt.  Dr. Orlin Hilding explained procedure and symptoms after surgery.  Signature Line     Electronically Signed on 04/23/2019 03:06 PM EST   ________________________________________________   ZOXWRUE, RN, CRISTY CARR

## 2019-04-23 NOTE — Anesthesia Post-Procedure Evaluation (Signed)
Postanesthesia Evaluation        Patient:   [name] [name], [name] [name]             MRN: [id number]            FIN: [id number]               Age:   34 years     Sex:  Female     DOB:  October 31, 1985   Associated Diagnoses:   None   Author:   Genella Rife C      Postoperative Information   Post Operative Info:          Post operative day: Post Anesthesia Care Unit.         Patient location: PACU.       Assessment   Postanesthesia assessment   Vitals: Vital Signs   04/23/2019 13:54 EST Systolic Blood Pressure 117 mmHg    Diastolic Blood Pressure 68 mmHg    Heart Rate Monitored 89 bpm    Respiratory Rate 16 br/min    SpO2 97 %   04/23/2019 13:44 EST Systolic Blood Pressure 126 mmHg    Diastolic Blood Pressure 94 mmHg  HI    Heart Rate Monitored 85 bpm    Respiratory Rate 15 br/min    SpO2 98 %   04/23/2019 13:34 EST Systolic Blood Pressure 126 mmHg    Diastolic Blood Pressure 94 mmHg  HI    Heart Rate Monitored 82 bpm    Respiratory Rate 18 br/min    SpO2 98 %   04/23/2019 13:24 EST Systolic Blood Pressure 122 mmHg    Diastolic Blood Pressure 81 mmHg    Heart Rate Monitored 80 bpm    Respiratory Rate 15 br/min    SpO2 95 %   04/23/2019 13:14 EST Systolic Blood Pressure 123 mmHg    Diastolic Blood Pressure 93 mmHg  HI    Heart Rate Monitored 82 bpm    Respiratory Rate 21 br/min  HI    SpO2 96 %   04/23/2019 13:04 EST Systolic Blood Pressure 118 mmHg    Diastolic Blood Pressure 42 mmHg  <LLOW    Temperature Axillary 36.5 degC    Heart Rate Monitored 103 bpm  HI    Respiratory Rate 30 br/min  HI    SpO2 99 %   04/23/2019 10:16 EST Systolic Blood Pressure 132 mmHg    Diastolic Blood Pressure 91 mmHg  HI    Temperature Oral 36.6 degC    Heart Rate Monitored 74 bpm    Respiratory Rate 18 br/min    SpO2 99 %      .     Respiratory function: Respiratory rate, airway, and oxygen saturation are at adequate levels.     Cardiovascular function: Heart Rate stable, Blood Pressure stable, Postoperative hydration status Adequate.     Mental status:  appropriate for level of anesthesia.     Temperature: within normal limits.     Pain Control: Adequate.     Nausea/Vomiting: Absent.     Games developer Signed on 04/23/2019 02:37 PM EST   ________________________________________________   Quentin Mulling

## 2019-04-23 NOTE — Discharge Summary (Signed)
Inpatient Clinical Summary              [facility]  Post-Acute Care Transfer Instructions  PERSON INFORMATION  Name: [name] [name], [name] [name]  MRN: [id number]    FIN#: [id number]  PHYSICIANS  Admitting Physician: Crista Curb  Attending Physician: Crista Curb  PCP: Stefanie Libel  Discharge Diagnosis:  Biliary dyskinesia  Comment:     PATIENT EDUCATION INFORMATION  Instructions:    Medication Leaflets:    Follow-up:              With: Address: When:   [address], [place] [address]  [phone] Business ([address])  [place] [address]  [phone] In 2 weeks   Comments:   Call for followup appointment                       Type Location Start [facility]   -[place] Gastric Emptying Study - Scan 1 [facility] Feb/22/2021 10:00 AM Feb/22/2021 12:00 PM Confirmed   -[place] Gastric Emptying Study - Scan 2 [facility] Feb/22/2021 2:00 PM Feb/22/2021 2:15 PM Confirmed          MEDICATION LIST  Medication Reconciliation at Discharge:          New Medications  [id number], [place], [place] [id number], [phone]  HYDROcodone-acetaminophen (Norco 325 mg-5 mg oral tablet) 1-2 tabs Oral (given by mouth) every 6 hours as needed moderate pain (4-7) for 5 Days. Refills: 0.  Last Dose:____________________  Medications that have not changed  Other Medications  cholecalciferol (Vitamin D3) 1 Capsules Oral (given by mouth) every day.  Last Dose:____________________  DULoxetine (DULoxetine 60 mg oral delayed release capsule) 1 Capsules Oral (given by mouth) once a day (in the morning).  Last Dose:____________________  ferrous sulfate (ferrous sulfate 325 mg (65 mg elemental iron) oral delayed release tablet) 1 Tabs Oral (given by mouth) every day., DO NOT CRUSH  Last Dose:____________________  mesalamine (mesalamine 0.375 g oral capsule, extended release) 4 Capsules Oral (given by mouth) once a day (in the morning).  Last  Dose:____________________  metoclopramide (Reglan 10 mg oral tablet) 1 Tabs Oral (given by mouth) 3 times a day.  Last Dose:____________________  multivitamin with minerals (Multi-Day Plus Minerals oral tablet) 1 Tabs Oral (given by mouth) every day.  Last Dose:____________________         Patient???s Final Home Medication List Upon Discharge:          cholecalciferol (Vitamin D3) 1 Capsules Oral (given by mouth) every day.  DULoxetine (DULoxetine 60 mg oral delayed release capsule) 1 Capsules Oral (given by mouth) once a day (in the morning).  ferrous sulfate (ferrous sulfate 325 mg (65 mg elemental iron) oral delayed release tablet) 1 Tabs Oral (given by mouth) every day., DO NOT CRUSH  HYDROcodone-acetaminophen (Norco 325 mg-5 mg oral tablet) 1-2 tabs Oral (given by mouth) every 6 hours as needed moderate pain (4-7) for 5 Days. Refills: 0.  mesalamine (mesalamine 0.375 g oral capsule, extended release) 4 Capsules Oral (given by mouth) once a day (in the morning).  metoclopramide (Reglan 10 mg oral tablet) 1 Tabs Oral (given by mouth) 3 times a day.  multivitamin with minerals (Multi-Day Plus Minerals oral tablet) 1 Tabs Oral (given by mouth) every day.         Comment:     ORDERS  Order Name Order Details   Discharge Patient 04/23/19 12:51:00 EST, Discharge Home/Self Care

## 2019-04-23 NOTE — Assessment & Plan Note (Signed)
PreOp Record - [facility]             PreOp Record - [facility] Summary                                                                     Primary Physician:        Tiffany Kocher R    Case Number:              [id number]    Finalized Date/Time:      04/23/19 10:45:44    Pt. Name:                 [name] [name], [name] [name]    D.O.B./Sex:               07/07/1985    Female    Med Rec #:                [id number]    Physician:                Durel Salts    Financial #:              [id number]    Pt. Type:                 S    Room/Bed:                 /    Admit/Disch:              04/23/19 10:06:00 -    Institution:       [facility] - Case Attendance - Preop                                                                                            Entry 1                         Entry 2                         Entry 3                                          Case Attendee             FORD-MD,  Werner Lean, RN, Manus Rudd, RN, CRISTY CARR    Role Performed            Surgeon Primary                 Preoperative Nurse  Preoperative Nurse    Time In     Time Out     Last Modified By:         Dan Humphreys, RN, Thomes Dinning, RN, Thomes Dinning, RN, Carol                              04/23/19 10:16:26               04/23/19 10:16:26               04/23/19 10:32:15      [facility] - Case Attendance - Preop Audit                                                             04/23/19 10:32:15         Owner: Marda Stalker                              Modifier: Marda Stalker                                                       <+> 3         Case Attendee        <+> 3         Role Performed        [facility] - Case Times - PreOp                                                                                                 Entry 1                                                                                                          Patient In Room Time      04/23/19 10:16:00               Nurse In Time                    04/23/19 10:24:00    Nurse Out Time  04/23/19 10:45:00               Patient Ready for               04/23/19 10:45:00                                                              Surgery/Procedure     Last Modified By:         Gilford Rile RN, Carol                              04/23/19 10:45:42      [place] - Case Times - PreOp Audit                                                                  04/23/19 10:45:42         Owner: Octaviano Batty                              Modifier: Octaviano Batty                                                       <+> 1         Patient Ready for Surgery/Procedure        <+> 1         Nurse Out Time     04/23/19 10:45:37         Owner: Octaviano Batty                              Modifier: Octaviano Batty                                                       <+> 1         Nurse In Time                Finalized By: Gilford Rile RN, Carol      Document Signatures                                                                             Signed By:           Gilford Rile, RN, Carol 04/23/19 10:45

## 2019-04-23 NOTE — Op Note (Signed)
Phase II Record - [facility]             Phase II Record - [place] Summary                                                                  Primary Physician:        Iline Oven R    Case Number:              [id number]    Finalized Date/Time:      04/23/19 15:27:00    Pt. Name:                 [name] [name], [name] [name]    D.O.B./Sex:               [birth year]    Female    Med Rec #:                [id number]    Physician:                FORD-MD,  [place] #:              [id number]    Pt. Type:                 S    Room/Bed:                 /    Admit/Disch:              04/23/19 10:06:00 -    Institution:       [facility] - Case Attendance - Phase II                                                                                         Entry 1                         Entry 2                                                                          Case Attendee             FORD-MD,  BENJAMIN R            [place]AD[place], RN, CRISTY CARR    Role Performed            Surgeon Primary                 Post Anesthesia Care  Nurse    Time In     Time Out     Last Modified By:         Carloyn Jaeger RN, Chinita Greenland, RN, CRISTY                              04/23/19 13:45:24               CARR 04/23/19 15:26:41      [facility] - Case Attendance - Phase II Audit                                                          04/23/19 15:26:41         Owner: [id number]                              Modifier: [email or web address]                                                      <+> 2         Case Attendee        <+> 2         Role Performed        [facility] - Case Times - Phase II                                                                                              Entry 1                                                                                                          Phase II In               04/23/19 13:44:00               Phase II Out                    04/23/19 15:09:00    Phase II  Discharge        04/23/19 15:09:00    Time     Last Modified By:         Beulah Gandy, RN, CRISTY  CARR 04/23/19 15:26:54              Finalized By: Beulah Gandy, RN, CRISTY CARR      Document Signatures                                                                             Signed By:           Beulah Gandy, RN, CRISTY CARR 04/23/19 15:27

## 2019-04-23 NOTE — Case Communication (Signed)
Discharge Follow-Up Form - Text       Discharge Follow-Up Entered On:  04/26/2019 8:12 EST    Performed On:  04/26/2019 8:11 EST by Gabriel Cirri, RN, Curt Jews               Clinical   Provider Follow-Up Post Discharge RTF :   Follow-Up Appointments    With:  [facility] FORD-MD  Address:  business (1), [address], [place], [place], [address];[phone] Business (1);, [address], [place], [place], [address];[phone]  When:  In 2 weeks  Comment:  Call for followup appointment       Macchio, RN, Curt Jews - 04/26/2019 8:11 EST   Status   Case Status :   Incomplete   Phone Call History Post DC (Readmit) :   First call   TCM Call Outcome :   No answer/unable to reach   [facility], RNCurt Jews - 04/26/2019 8:11 EST

## 2019-04-23 NOTE — Discharge Summary (Signed)
Inpatient Patient [name] [name]  [address] [place] [address]  [phone]  Patient Discharge Instructions     Name: [name] [name], [name] [name]  Current Date: 04/23/2019 12:51:48  DOB: 16-Dec-1985 [id number] FIN:[id number]%>[phone]  Patient Address: [place] [address]  Patient Phone: [phone]  Primary Care Provider:  Name: Stefanie Libel  Phone: [phone]  Immunizations Provided:      Discharge Diagnosis: Biliary dyskinesia  Discharged To: TO, ANTICIPATED%>  Home Treatments: TREATMENTS, ANTICIPATED%>  Devices/Equipment: EQUIPMENT REHAB%>  Post [name] Services: [name] SERVICES%>  Professional Skilled Services: SKILLED SERVICES%>  Education administrator and Community Resources: SERV AND COMM RES, ANTICIPATED%>  Mode of Discharge Transportation: TRANSPORTATION%>  Discharge Orders:          Discharge Patient 04/23/19 12:51:00 EST, Discharge Home/Self Care         Comment:   Medications  During the course of your visit, your medication list was updated with the most current information. The details of those changes are reflected below:          New Medications  [id number], [place], [place] [id number], [phone]  HYDROcodone-acetaminophen (Norco 325 mg-5 mg oral tablet) 1-2 tabs Oral (given by mouth) every 6 hours as needed moderate pain (4-7) for 5 Days. Refills: 0.  Last Dose:____________________  Medications that have not changed  Other Medications  cholecalciferol (Vitamin D3) 1 Capsules Oral (given by mouth) every day.  Last Dose:____________________  DULoxetine (DULoxetine 60 mg oral delayed release capsule) 1 Capsules Oral (given by mouth) once a day (in the morning).  Last Dose:____________________  ferrous sulfate (ferrous sulfate 325 mg (65 mg elemental iron) oral delayed release tablet) 1 Tabs Oral (given by mouth) every day., DO NOT CRUSH  Last Dose:____________________  mesalamine (mesalamine 0.375 g oral  capsule, extended release) 4 Capsules Oral (given by mouth) once a day (in the morning).  Last Dose:____________________  metoclopramide (Reglan 10 mg oral tablet) 1 Tabs Oral (given by mouth) 3 times a day.  Last Dose:____________________  multivitamin with minerals (Multi-Day Plus Minerals oral tablet) 1 Tabs Oral (given by mouth) every day.  Last Dose:____________________      [facility] would like to thank you for allowing [place] to assist you with your healthcare needs. The following includes patient education materials and information regarding your injury/illness.  [name] [name] has been given the following list of follow-up instructions, prescriptions, and patient education materials:  Follow-up Instructions:              With: Address: When:   Malka Bocek-MD [address], [place] [address]  [phone] Business ([address])  [place] [address]  [phone] In 2 weeks   Comments:   Call for followup appointment                       Type Location Start [place]   -[place] Gastric Emptying Study - Scan 1 [name] Feb/22/2021 10:00 AM Feb/22/2021 12:00 PM Confirmed   -[place] Gastric Emptying Study - Scan 2 [facility] Feb/22/2021 2:00 PM Feb/22/2021 2:15 PM Confirmed            It is important to always keep an active list of medications available so that you can share with other providers and manage your medications appropriately. As an  additional courtesy, we are also providing you with your final active medications list that you can keep with you.           cholecalciferol (Vitamin D3) 1 Capsules Oral (given by mouth) every day.  DULoxetine (DULoxetine 60 mg oral delayed release capsule) 1 Capsules Oral (given by mouth) once a day (in the morning).  ferrous sulfate (ferrous sulfate 325 mg (65 mg elemental iron) oral delayed release tablet) 1 Tabs Oral (given by mouth) every day., DO NOT CRUSH  HYDROcodone-acetaminophen (Norco 325 mg-5 mg oral tablet) 1-2 tabs Oral (given  by mouth) every 6 hours as needed moderate pain (4-7) for 5 Days. Refills: 0.  mesalamine (mesalamine 0.375 g oral capsule, extended release) 4 Capsules Oral (given by mouth) once a day (in the morning).  metoclopramide (Reglan 10 mg oral tablet) 1 Tabs Oral (given by mouth) 3 times a day.  multivitamin with minerals (Multi-Day Plus Minerals oral tablet) 1 Tabs Oral (given by mouth) every day.      Take only the medications listed above. Contact your doctor prior to taking any medications not on this list.  Discharge instructions, if any, will display below     Instructions for Diet: INSTRUCTIONS FOR DIET%>A Healthy Diet, As Tolerated  Instructions for Supplements: SUPPLEMENT INSTRUCTIONS%>  Instructions for Activity: INSTRUCTIONS FOR ACTIVITY%>As Tolerated, Daily walk, Limit lifting to less than 10 pounds, May shower, No Baths/Hot Tubs/Oceans/ or Pools,  You may drive if comfortable and not taking pain medication  Instructions for Wound Care: INSTRUCTIONS FOR WOUND CARE%>Keep clean and dry, Report signs/symptoms of infection such as fever, swelling,     Medication leaflets, if any, will display below    Patient education materials, if any, will display below       IS IT A STROKE?  Act FAST and Check for these signs:    FACE                          Does the face look uneven?    ARM                          Does one arm drift down?    SPEECH                     Does their speech sound strange?    TIME                          Call 9-1-1 at any sign of stroke  Heart Attack Signs  Chest discomfort: Most heart attacks involve discomfort in the center of the chest and lasts more than a few minutes, or goes away and comes back. It can feel like uncomfortable pressure, squeezing, fullness or pain.  Discomfort in upper body: Symptoms can include pain or discomfort in one or both arms, back, neck, jaw or stomach.  Shortness of breath: With or without discomfort.  Other signs: Breaking out in a cold sweat, nausea, or  lightheaded.  Remember, MINUTES DO MATTER. If you experience any of these heart attack warning signs, call 9-1-1 to get immediate medical attention!     ---------------------------------------------------------------------------------------------------------------------  [name] allows you to manage your health, view your test results, and retrieve your discharge documents from your [name] stay securely and conveniently from your computer.  To begin the enrollment process, visit [email or web address] Click on ???Sign up now???  under [place] [name] [place].

## 2019-04-23 NOTE — Anesthesia Pre-Procedure Evaluation (Signed)
Preanesthesia Evaluation        Patient:   [name] [name], [name] [name]             MRN: [id number]            FIN: [id number]               Age:   34 years     Sex:  Female     DOB:  [birth year]   Associated Diagnoses:   None   Author:   Shaylee Stanislawski-MD,  Emberli Ballester K      Preoperative Information   NPO:  NPO greater than 8 hours.    Anesthesia history     Patient's history: negative.     Family's history: negative.        Health Status   Allergies:    Allergic Reactions (Selected)  Severe  Doxycycline- Anaphylactic reaction.  Levaquin- Anaphylactic reaction.,    Allergies    (Active and Proposed Allergies Only)  Levaquin   (Severity: Severe, Onset: Unknown)   Reactions: Anaphylactic reaction  doxycycline   (Severity: Severe, Onset: Unknown)   Reactions: Anaphylactic reaction     Current medications:    Home Medications (6) Active  DULoxetine 60 mg oral delayed release capsule 60 mg = 1 caps, Oral, qAM  ferrous sulfate 325 mg (65 mg elemental iron) oral delayed release tablet 325 mg = 1 tabs, Oral, Daily  mesalamine 0.375 g oral capsule, extended release 1.5 g = 4 cap, Oral, qAM  Multi-Day Plus Minerals oral tablet 1 tabs, Oral, Daily  Reglan 10 mg oral tablet 10 mg = 1 tabs, Oral, TID  Vitamin D3 1 caps, Oral, Daily  ,    Medications (11) Active  Scheduled: (2)  ceFAZolin duplex  2 g 50 mL, IV Piggyback, On Call  sodium chloride 0.9% Inj Soln 10 mL syringe  30 mL, IV Push, q8hr  Continuous: (1)  Lactated Ringers Injection solution 1,000 mL  1,000 mL, IV, 10 mL/hr  PRN: (8)  A Patient Specific Medication  1 EA, Kit-Combo, q90mn  A Patient Specific Refrigerated Medication  1 EA, Kit-Combo, q523m  Delivery and Return Bin Access  1 EA, Kit-Combo, q5m45m lidocaine 1% PF Inj Soln 2 mL  0.25 mL, ID, q5mi71mRespiratory MDI Treatment  1 EA, Kit-Combo, q5min23modium chloride 0.9% Inj Soln 10 mL syringe  30 mL, IV Push, q5min 35mdium chloride 0.9% Inj Soln 10 mL vial PF  30 mL, IV Push, q5min  29mrile water Inj Soln 10 mL  10 mL, N/A, q5min   80mroblem  list:    Active Problems (6)  Anxiety and depression   Biliary dyskinesia   Colitis   Crohn disease   Panic attacks   PCOS (polycystic ovarian syndrome)   ,    Problems   (Active Problems Only)    Crohn's disease   (SNOMED CT: 5676501662694854 --)  Colitis   (SNOMED CT: 10675801627035009 --)  Biliary dyskinesia   (SNOMED CT: 30360501381829937 --)  Mixed anxiety and depressive disorder   (SNOMED CT: 34697901169678938 --)  Polycystic ovary syndrome   (SNOMED CT: 35532301101751025 --)  Panic attack   (SNOMED CT: 33904401852778242 --)        Histories   Past Medical History:    No active or resolved past medical history items have been selected or recorded.   Procedure history:    Colonoscopy ([id number]- Esophagogastroduodenoscopy ([id number] LYMPHADENECTOMY.  Comments:  04/20/2019 15:45 EST - Arvilla Meres, RN, Dreama Saa  BENIGN  Cesarean section (16109604).  Tonsillectomy (540981191).  D &C.   Social History        Social & Psychosocial Habits    Alcohol  12/04/2018  Use: Denies    Substance Use  04/20/2019  Opioid Assessment Opioid Naive-not taking    Use: Denies    Tobacco  04/20/2019  Use: Former smoker, quit more    Comment: QUIT 02/2019 - 04/20/2019 15:48 - Arvilla Meres, RN, Dreama Saa    Electronic Cigarette/Vaping  04/20/2019  Electronic Cigarette Use: Never  .     Symptoms of Sleep Apnea Score: PAT Documentation: 1                        04/20/2019 15:37 EST        Physical Examination   Vital Signs   4/78/2956 21:30 EST Systolic Blood Pressure 865 mmHg    Diastolic Blood Pressure 91 mmHg  HI    Temperature Oral 36.6 degC    Heart Rate Monitored 74 bpm    Respiratory Rate 18 br/min    SpO2 99 %         Vital Signs (last 24 hrs)_____  Last Charted___________  Temp Oral     36.6 degC  (FEB 19 10:16)  Resp Rate         18 br/min  (FEB 19 10:16)  SBP      132 mmHg  (FEB 19 10:16)  DBP      H 54mHg  (FEB 19 10:16)  SpO2      99 %  (FEB 19 10:16)  Weight      99.9 kg  (FEB 19 10:16)  Height      160 cm  (FEB 19  10:16)  BMI      39.02  (FEB 19 10:22)     Measurements from flowsheet : Measurements   04/23/2019 10:22 EST Body Mass Index est meas 39.02 kg/m2    Body Mass Index Measured 39.02 kg/m2   04/23/2019 10:16 EST Height/Length Measured 160 cm    Weight Measured 99.9 kg    Weight Dosing 99.9 kg      Pain assessment:  Pain Assessment   04/23/2019 10:24 EST Numeric Rating Pain Scale 3    Numeric Pain Rating Comfort Function Goal 3      .    General:          Stress: No acute distress.         Appearance: Within normal limits.    Airway:          Mallampati classification: II (soft palate, fauces, uvula visible).         Thyromental Distance: Normal.         Mouth: Teeth ( Within normal limits ).         Throat: Within normal limits.    Head:  Normocephalic, Atraumatic.    Neck:  Full range of motion.    Respiratory:  Lungs are clear to auscultation, Breath sounds are equal.    Cardiovascular:  Normal rate, No murmur, Regular rhythm.    Gastrointestinal:  Deferred.    Neurologic:  Alert.       Review / Management   Results review:     No qualifying data available, Lab results   04/23/2019 10:26 EST Pregnancy U QL Negative   04/23/2019 10:22 EST Estimated Creatinine Clearance 102.07 mL/min   .  Assessment and Plan   American Society of Anesthesiologists#(ASA) physical status classification:  Class II.    Anesthetic Preoperative Plan     Anesthetic technique: General anesthesia.     Maintenance airway: Oral endotracheal tube.     Opioid Assessment: Opioid Na????ve.     Risks discussed: nausea, vomiting, sore throat, hypotension, serious complications.     Signature Line     Electronically Signed on 04/23/2019 11:01 AM EST   ________________________________________________   Inda Coke

## 2019-04-23 NOTE — Progress Notes (Signed)
Progress Note-Nurse               PT SITTING IN STRETCHER 2ND NORCO GIVEN PER RX. MAALOX GIVEN. PT STILL W/ ANXIETY.  Signature Line     Electronically Signed on 04/23/2019 03:20 PM EST   ________________________________________________   GNOIBBC, RN, CRISTY CARR

## 2019-04-23 NOTE — Nursing Note (Signed)
Nursing Discharge Summary - Text       Physician Discharge Summary Entered On:  04/23/2019 12:51 EST    Performed On:  04/23/2019 12:51 EST by Maxie Debose-MD,  Raeonna Milo R               DC Information   Provider Instructions for Diet :   A Healthy Diet, As Tolerated   Provider Instructions for Activity :   As Tolerated, Daily walk, Limit lifting to less than 10 pounds, May shower, No Baths/Hot Tubs/Oceans/ or Pools, You may drive if comfortable and not taking pain medication   Provider Instructions for Wound Care :   Keep clean and dry, Report signs/symptoms of infection such as fever, swelling, heat, drainage, redness to surgeon   Belma Dyches-MD,  Mylik Pro R - 04/23/2019 12:51 EST

## 2019-04-23 NOTE — Op Note (Signed)
Phase I Record - [facility]             Phase I Record - [facility] Summary                                                                   Primary Physician:        Iline Oven R    Case Number:              [id number]    Finalized Date/Time:      04/23/19 15:26:28    Pt. Name:                 [name] [name], [name] [name]    D.O.B./Sex:               August 09, 1985    Female    Med Rec #:                [id number]    Physician:                FORD-MD,  [place] #:              [id number]    Pt. Type:                 S    Room/Bed:                 /    Admit/Disch:              04/23/19 10:06:00 -    Institution:       [facility] - Case Attendance - Phase I                                                                                          Entry 1                         Entry 2                                                                          Case Attendee             Hollins, RN, Karlyne Greenspan, RN, CRISTY CARR    Role Performed            Post Anesthesia Care            Post Anesthesia Care  Nurse                           Nurse    Time In     Time Out     Last Modified By:         Carloyn Jaeger RN, Chinita Greenland, RN, CRISTY                              04/23/19 13:45:43               CARR 04/23/19 15:26:19      [facility] - Case Attendance - Phase I Audit                                                           04/23/19 15:26:19         Owner: [id number]                              Modifier: [email or web address]                                                      <+> 2         Case Attendee        <+> 2         Role Performed        [facility] - Case Times - Phase I                                                                                               Entry 1                                                                                                          Phase I In                04/23/19 13:04:00               Phase I Out                     04/23/19 13:44:00    Phase I  Discharge  04/23/19 13:44:00    Time     Last Modified By:         Beulah Gandy, RN, CRISTY                              CARR 04/23/19 15:26:27      [facility] - Case Times - Phase I Audit                                                                04/23/19 15:26:27         Owner: [id number]                              Modifier: [email or web address]                                                      <+> 1         Phase I Out        <+> 1         Phase I Discharge Time                Finalized By: Beulah Gandy, RN, CRISTY CARR      Document Signatures                                                                             Signed By:           Beulah Gandy, RN, CRISTY CARR 04/23/19 15:26

## 2019-04-23 NOTE — Procedures (Signed)
IntraOp Record - [facility]             IntraOp Record - [place] Summary                                                                   Primary Physician:        Iline Oven R    Case Number:              [id number]    Finalized Date/Time:      04/23/19 13:15:01    Pt. Name:                 [name] [name], [name] [name]    D.O.B./Sex:               1986-02-14    Female    Med Rec #:                [id number]    Physician:                FORD-MD,  [place] #:              [id number]    Pt. Type:                 S    Room/Bed:                 /    Admit/Disch:              04/23/19 10:06:00 -    Institution:       [facility] - Case Attendance                                                                                                    Entry 1                         Entry 2                         Entry 3                                          Case Attendee             FORD-MD,  BENJAMIN R            HUMSI-MD,  Grubbs, DAWN M    Role Performed            Surgeon Primary                 Anesthesiologist  CRNA    Time In                   04/23/19 12:15:00               04/23/19 12:15:00               04/23/19 12:15:00    Time Out                  04/23/19 13:03:00               04/23/19 13:03:00               04/23/19 13:03:00    Procedure                 Cholecystectomy                 Cholecystectomy                 Cholecystectomy                              Laparoscopic                    Laparoscopic                    Laparoscopic    Last Modified By:         Vella Raring RN, Cherylann Banas, RN, [facility], RN, [place]                              L 04/23/19 13:14:57             L 04/23/19 13:14:57             L 04/23/19 13:14:57                                Entry 4                         Entry 5                         Entry 6                                          Case Attendee             Koleen Distance, RN,  [place], LPN, [place]                       L    Role Performed            First Environmental consultant  Circulator                      Surgical Scrub    Time In                   04/23/19 12:15:00               04/23/19 12:15:00               04/23/19 12:15:00    Time Out                  04/23/19 13:03:00               04/23/19 13:03:00               04/23/19 13:03:00    Procedure                 Cholecystectomy                 Cholecystectomy                 Cholecystectomy                              Laparoscopic                    Laparoscopic                    Laparoscopic    Last Modified By:         Vella Raring RN, Cherylann Banas, RN, [place], RN, [place]                              L 04/23/19 13:14:57             L 04/23/19 13:14:57             L 04/23/19 13:14:57      [facility] - Case Attendance Audit                                                                     04/23/19 13:14:57         Owner: [id number]                              Modifier: [id number]                                                           1     <+> Time Out            1     <*> Procedure                              Cholecystectomy Laparoscopic  2     <+> Time Out            2     <*> Procedure                              Cholecystectomy Laparoscopic            3     <+> Time Out            3     <*> Procedure                              Cholecystectomy Laparoscopic            4     <+> Time Out            4     <*> Procedure                              Cholecystectomy Laparoscopic            5     <+> Time Out            5     <*> Procedure                              Cholecystectomy Laparoscopic            6     <+> Time Out            6     <*> Procedure                              Cholecystectomy Laparoscopic     04/23/19 12:32:55         Owner: [id number]                              Modifier: [id number]                                                            1     <+> Time In            1     <*> Procedure                              Cholecystectomy Laparoscopic            2     <+> Time In            2     <*> Procedure                              Cholecystectomy Laparoscopic            3     <+> Time In            3     <*> Procedure  Cholecystectomy Laparoscopic            4     <+> Time In            4     <*> Procedure                              Cholecystectomy Laparoscopic            5     <+> Time In            5     <*> Procedure                              Cholecystectomy Laparoscopic            6     <+> Time In            6     <*> Procedure                              Cholecystectomy Laparoscopic     04/23/19 12:32:18         Owner: [id number]                              Modifier: [id number]                                                       <+> 2         Case Attendee        <+> 2         Role Performed        <+> 2         Procedure        <+> 3         Case Attendee        <+> 3         Role Performed        <+> 3         Procedure        <+> 4         Case Attendee        <+> 4         Role Performed        <+> 4         Procedure        <+> 5         Case Attendee        <+> 5         Role Performed        <+> 5         Procedure        <+> 6         Case Attendee        <+> 6         Role Performed        <+> 6         Procedure        [facility] - Case Times  Entry 1                                                                                                          Patient      In Room Time             04/23/19 12:15:00               Out Room Time                   04/23/19 13:03:00    Anesthesia     Procedure      Start Time               04/23/19 12:29:00               Stop Time                       04/23/19 12:50:00    Last Modified By:         Vella Raring RN, [place] 04/23/19 13:03:32      [facility] - Case Times Audit                                                                          04/23/19 13:03:32         Owner: [id number]                              Modifier: [id number]                                                       <+> 1         Out Room Time     04/23/19 13:01:46         Owner: [id number]                              Modifier: [id number]                                                       <+> 1         Stop Time  [facility] - General Case Data                                                                                                  Entry 1                                                                                                          Case Information      ASA Class                2                               Case Level                      Level 3     OR                       BH 01                           Specialty                       General (SN)     Wound Class              2-Clean-Contaminated    Preop Diagnosis           BILIARY DYSKINESIA              Postop Diagnosis                BILIARY DYSKINESIA    Last Modified ByVella Raring, RN, Cathleen                              L 04/23/19 12:33:11      [place] - Procedures                                                                                                         Entry  1                                                                                                          Procedure     Description      Procedure                Cholecystectomy                 Surgical Procedure              LAP CHOLE                              Laparoscopic                    Text     Primary Procedure         Yes                             Primary Surgeon                 Crista Curb    Start                     04/23/19 12:29:00               Stop                            04/23/19 12:50:00    Anesthesia Type           General                         Surgical Service                 General (SN)    Wound Class               2-Clean-Contaminated    Last Modified By:         Vella Raring, RN, [place]                              L 04/23/19 13:01:50      [place] - Procedures Audit                                                                          04/23/19 13:01:50         Owner: [id number]  Modifier: [id number]                                                       <+> 1         Stop        [facility] - [place]                                                                                                            Entry 1                                                                                                          ESU Type                  GENERATOR                       Identification                  [id number]                              COVIDIEN/VALLEYLAB              Number     Coag Setting (watts)      30                              Cut Setting (watts)             30    Grounding Pad             Yes                             Grounding Pad Site              Thigh, left    Needed?     Grounding Pad             [place], Therapist, sports, Psychiatrist         Outcome Met (O.10)              Yes    Applied By                L    Last Modified By:  [address] RN, [place]                              L 04/23/19 12:34:47      [place] - Counts Initial and Final                                                                                           Entry 1                                                                                                          Initial Counts      Initial Counts           [place], RN, [employer]         Items included in               Instruments, [employer],     Performed By             Tammi Sou, LPN, TAMMY B        the Initial Count               [place], Other/See                                                                                              Comments    Final Counts      Final Counts             Vella Raring, RN, [employer]          Final Count Status              Correct     Performed By             Tammi Sou, LPN, TAMMY B     Items Included in        Sponges, [place],     Final Count              Other/See Comments    Outcome Met (O.20)        Yes    Last Modified By:         Vella Raring, RN, Cathleen  L 04/23/19 12:36:13      [facility] - Dressing/Packing                                                                                                   Entry 1                                                                                                          Site                      Abdomen    Dressing Item     Details      Dressing Item            Liquid Bandage     (Im.290)     Last Modified By:         Vella Raring, RN, Cathleen                              L 04/23/19 12:36:26      [facility] - Fire Risk Assessment                                                                                               Entry 1                                                                                                          Fire Risk                 Alcohol Based Prep              Fire Risk Score                 2    Assessment: If  Solution, Ignition    checked, checkmark        Source In Use    = 1 point     Last Modified By:         Vella Raring, RN, Cathleen                              L 04/23/19 12:35:08      [facility] - Medications                                                                                                        Entry 1                                                                                                          Time Administered         04/23/19 12:29:00               Medication                      BUPIVACAINE 0.5%                                                                                              EPINEPHRINE INJECTION                                                                                              30ML    Route of Admin            Local Injection                  Dose/Volume                     30 ml                                                              (  include amount and                                                               unit of measure)     Site                      Abdomen                         Administered By                 FORD-MD,  BENJAMIN R    Outcome Met (O.130)       Yes    Last Modified By:         Vella Raring, RN, Cathleen                              L 04/23/19 12:35:42      [place] - Patient Care Devices                                                                                               Entry 1                                                                                                          Equipment Type            MACHINE SEQUENTIAL              SCD Sleeve Site                 Legs Bilateral                              COMPRESSION    Equipment/Tag Number      [id number]                       Initiated Pre                   Yes  Induction     Last Modified By:         Vella Raring, RN, [place]                              L 04/23/19 12:41:32      [place] - Patient Positioning                                                                                                Entry 1                                                                                                          Procedure                 Cholecystectomy                 Body Position                   Supine                              Laparoscopic    Left Arm Position         Extended on Padded Arm          Right Arm Position              Extended on Padded Arm                              Board w/Security Strap                                          Board w/Security Strap    Left Leg Position         Extended Security               Right Leg Position              Extended Security                              Strap, Pillow Under Knee                                        Strap, Pillow Under Knee     Feet Uncrossed  Yes                             Pressure Points                 Yes                                                              Checked     Positioning Device        Head Rest Foam, Foam            Positioned By                   [employer], RN, [employer], Pillow, Safety                                         L, ALVAREZ-AGACNP,                              Strap                                                           [place], WARNE-CRNA,                                                                                              DAWN M, FORD-MD,                                                                                              [place] R    Outcome Met (O.80)        Yes    Last Modified ByVella Raring, RN, Cathleen                              L 04/23/19 12:42:27      [place] - Time Out  Entry 1                                                                                                          Procedure                 Cholecystectomy                 Is everyone ready               Yes                              Laparoscopic                    to perform time out     Have all members of       Yes                             Patient name and                Yes    the surgical team                                         DOB confirmed     been introduced     Allergies discussed       Yes                             Surgical procedure              Yes                                                              to be performed                                                               confirmed and                                                               verified by  completed surgical                                                               consent      Correct surgical          Yes                             Correct laterality              Yes    site marked and                                           confirmed     initials are     visible through     prepped and draped     field (or     alternative ID band     used)     Correct patient           Yes                             Surgeon shares                  Yes    position confirmed                                        operative plan,                                                               possible                                                               difficulties,                                                               expected duration,                                                               anticipated blood  loss and reviews                                                               all                                                               critical/specific                                                               concerns     Required blood            Yes                             Essential imaging               Yes    products, implants,                                       available and fetal     devices and/or                                            heartones confirmed     special equipment                                         (if applicable)     available and     sterility confirmed     VTE prophylaxis           Yes                             Antibiotics ordered             Yes    addressed                                                 and administered     Anesthesia shares         Yes                             Fire risk                       Yes    anesthetic plan and  assessment scored     reviews patient                                           and plan discussed     specific concerns     Appropriate drying        Yes                              Surgeon states:                 Yes    time for prep                                             Does anyone have     observed before                                           any concerns? If     draping                                                   you see, suspect,                                                               or feel that                                                               patient care is                                                               being compromised,                                                               speak up for  patient safety     Time Out Complete         04/23/19 12:27:00    Last Modified By:         Vella Raring, RN, Cathleen                              L 04/23/19 12:32:53      [facility] - Debrief                                                                                                            Entry 1                                                                                                          Procedure                 Cholecystectomy                 Final counts                    Yes                              Laparoscopic                    correct and                                                               verbally verified                                                               with                                                               surgeon/licensed  independent                                                               practitioner (if                                                               applicable)     Actual procedure          Yes                             Postop diagnosis                Yes    performed confirmed                                       confirmed     Wound                     Yes                             Confirm specimens               Yes     classification                                            and specimens     confirmed                                                 labeled                                                               appropriately (if                                                               applicable)     Foley catheter            Yes                             Patient recovery                Yes  removed (if                                               plan confirmed     applicable)     Debrief Complete          04/23/19 12:48:00    Last Modified By:         Vella Raring, RN, [place]                              L 04/23/19 13:01:35      [place] - Debrief Audit                                                                             04/23/19 13:01:35         Owner: [id number]                              Modifier: [id number]                                                           1     <*> Procedure                              Cholecystectomy Laparoscopic            1     <+> Debrief Complete        [facility] - Skin Assessment                                                                                                    Entry 1                                                                                                          Skin Integrity            Intact    Last Modified  By:         Vella Raring, RN, Cathleen                              L 04/23/19 12:36:29      [facility] - Skin Prep                                                                                                          Entry 1                                                                                                          Hair Removal     Skin Prep      Prep Agents (Im.270)     Chlorhexidine Gluconate         Prep Area (Im.270)              Abdomen                              2% w/Alcohol     Prep By                  Vella Raring, RN, Cathleen                              L    Outcome Met (O.100)       Yes    Last Modified ByVella Raring, RN,  Cathleen                              L 04/23/19 12:36:38      [facility] - Specimens                                                                                                          Entry 1  Description               gallbladder                     Specimen Type                   Routine    Last Modified By:         Vella Raring, RN, Pearline Cables 04/23/19 12:36:54      [place] - Transfer                                                                                                           Entry 1                                                                                                          Transferred By            Vella Raring, RN, [place]         Via                             [place] Ponder, DAWN M    Post-op Destination       PACU    Skin Assessment      Condition                Intact    Last Modified ByVella Raring, RN, Cathleen                              L 04/23/19 12:37:04      Case Comments                                                                                         <None>  Finalized By: Vella Raring, RN, Cathleen L      Document Signatures                                                                             Signed By:           Vella Raring, RN, Cathleen L 04/23/19 13:15

## 2019-08-04 LAB — COMPREHENSIVE METABOLIC PANEL
ALT: 25 U/L (ref 0–33)
AST: 16 U/L (ref 0–32)
Albumin/Globulin Ratio: 2.1 mmol/L — ABNORMAL HIGH (ref 1.00–2.00)
Albumin: 4.2 g/dL (ref 3.5–5.2)
Alk Phosphatase: 79 U/L (ref 35–117)
Anion Gap: 8 mmol/L (ref 2–17)
BUN: 12 mg/dL (ref 6–20)
CO2: 29 mmol/L (ref 22–29)
Calcium: 9.8 mg/dL (ref 8.6–10.0)
Chloride: 106 mmol/L (ref 98–107)
Creatinine: 0.9 mg/dL (ref 0.5–1.0)
GFR African American: 99 mL/min/{1.73_m2} (ref >=90)
GFR Non-African American: 85 mL/min/{1.73_m2} — ABNORMAL LOW (ref >=90)
Globulin: 2 g/dL (ref 1.9–4.4)
Glucose: 96 mg/dL (ref 70–99)
OSMOLALITY CALCULATED: 285 mOsm/kg (ref 270–287)
Potassium: 4.9 mmol/L (ref 3.5–5.3)
Sodium: 143 mmol/L (ref 135–145)
Total Bilirubin: <0.15 mg/dL (ref 0.00–1.20)
Total Protein: 6.2 g/dL — ABNORMAL LOW (ref 6.4–8.3)

## 2019-08-04 LAB — CBC
Hematocrit: 38.4 % (ref 34.0–47.0)
Hemoglobin: 13 g/dL (ref 11.5–15.7)
MCH: 32 pg (ref 27.0–34.5)
MCHC: 33.9 g/dL (ref 32.0–36.0)
MCV: 94.6 fL (ref 81.0–99.0)
MPV: 10.3 fL (ref 7.2–13.2)
NRBC Absolute: 0 10*3/uL (ref 0.000–0.012)
NRBC Automated: 0 % (ref 0.0–0.2)
Platelets: 333 10*3/uL (ref 140–440)
RBC: 4.06 x10e6/mcL (ref 3.60–5.20)
RDW: 12.7 % (ref 11.0–16.0)
WBC: 8.4 10*3/uL (ref 3.8–10.6)

## 2019-08-04 LAB — HEMOGLOBIN A1C
Est. Avg. Glucose, WB: 111
Est. Avg. Glucose-calculated: 118
Hemoglobin A1C: 5.5 % (ref 4.0–6.0)

## 2019-09-29 LAB — IGP, APTIMA HPV (199330)
.: 0
HPV Aptima: NEGATIVE

## 2019-10-03 NOTE — ED Notes (Signed)
ED Triage Note       ED Triage Adult Entered On:  10/03/2019 13:49 EDT    Performed On:  10/03/2019 13:46 EDT by Adriana Simas, RN, Colleen P               Triage   Numeric Rating Pain Scale :   10 = Worst possible pain   Chief Complaint :   [name] [name] with dry socket. seen this am pain . pain meds not working   [employer] of Arrival :   Walking   Infectious Disease Documentation :   Document assessment   Temperature Oral :   36.7 degC(Converted to: 98.1 degF)    Heart Rate Monitored :   86 bpm   Respiratory Rate :   14 br/min   Systolic Blood Pressure :   118 mmHg   Diastolic Blood Pressure :   88 mmHg   SpO2 :   99 %   Oxygen Therapy :   Room air   Patient presentation :   None of the above   Chief Complaint or Presentation suggest infection :   No   Dosing Weight Obtained By :   Measured   Weight Dosing :   86.1 kg(Converted to: 189 lb 13 oz)    Height :   160 cm(Converted to: 5 ft 3 in)    Body Mass Index Dosing :   34 kg/m2   [name] - 10/03/2019 13:46 EDT   DCP GENERIC CODE   Tracking Acuity :   4   Tracking Group :   ED [employer]acking Group   Sylvia, RN, [place] P - 10/03/2019 13:46 EDT   ED General Section :   Document assessment   Pregnancy Status :   Patient denies   Last Menstrual Period :   09/18/2019 EDT   ED Allergies Section :   Document assessment   ED Reason for Visit Section :   Document assessment   ED Home Meds Section :   Document assessment   ED Quick Assessment :   Patient appears awake, alert, oriented to baseline. Skin warm and dry. Moves all extremities. Respiration even and unlabored. Appears in no apparent distress.   Adriana Simas, RN, [place] P - 10/03/2019 13:46 EDT   ID Risk Screen Symptoms   Recent Travel History :   No recent travel   Close Contact with COVID-19 ID :   No   Last 14 days COVID-19 ID :   No   Adriana Simas RN, Sammuel Cooper - 10/03/2019 13:46 EDT   Allergies   (As Of: 10/03/2019 13:50:00 EDT)   Allergies (Active)   doxycycline  Estimated Onset Date:   Unspecified ; Reactions:   Anaphylactic reaction ;  Created By:   [name], RN, Krystle L; Reaction Status:   Active ; Category:   Drug ; Substance:   doxycycline ; Type:   Allergy ; Severity:   Severe ; Updated By:   [name], RN, [name]; Reviewed Date:   10/03/2019 3:19 EDT      Levaquin  Estimated Onset Date:   Unspecified ; Reactions:   Anaphylactic reaction ; Created By:   [name], RN, [name]; Reaction Status:   Active ; Category:   Drug ; Substance:   Levaquin ; Type:   Allergy ; Severity:   Severe ; Updated By:   Dot Lanes; Reviewed Date:   10/03/2019 3:19 EDT        Psycho-Social  Last 3 mo, thoughts killing self/others :   Patient denies   Right click within box for Suspected Abuse policy link. :   None   Feels Safe Where Live :   Yes   Adriana Simas, RN, Sammuel Cooper - 10/03/2019 13:46 EDT   ED Home Med List   Medication List   (As Of: 10/03/2019 13:50:00 EDT)   Normal Order    bupivacaine 0.5% PF Inj Soln 10 mL  :   bupivacaine 0.5% PF Inj Soln 10 mL ; Status:   Completed ; Ordered As Mnemonic:   Marcaine HCl 0.5% preservative-free injectable solution ; Simple Display Line:   1 mL, ID, Once ; Ordering Provider:   Blossom Hoops L-MD; Catalog Code:   bupivacaine ; Order Dt/Tm:   10/03/2019 03:24:19 EDT          acetaminophen-HYDROcodone 325 mg-5 mg Tab  :   acetaminophen-HYDROcodone 325 mg-5 mg Tab ; Status:   Completed ; Ordered As Mnemonic:   Norco 325 mg-5 mg oral tablet ; Simple Display Line:   1 tabs, Oral, Once ; Ordering Provider:   Blossom Hoops L-MD; Catalog Code:   HYDROcodone-acetaminophen ; Order Dt/Tm:   10/03/2019 03:19:21 EDT          ketorolac 30 mg/mL Inj Soln 1 mL  :   ketorolac 30 mg/mL Inj Soln 1 mL ; Status:   Completed ; Ordered As Mnemonic:   Toradol ; Simple Display Line:   30 mg, 1 mL, IM, Once ; Ordering Provider:   Blossom Hoops L-MD; Catalog Code:   ketorolac ; Order Dt/Tm:   10/03/2019 03:19:21 EDT            Prescription/Discharge Order    HYDROcodone-acetaminophen  :   HYDROcodone-acetaminophen ; Status:   Prescribed ; Ordered  As Mnemonic:   Norco 325 mg-5 mg oral tablet ; Simple Display Line:   1 tabs, Oral, q6hr, for 3 days, PRN: moderate pain (4-7), 7 tabs, 0 Refill(s) ; Ordering Provider:   Blossom Hoops L-MD; Catalog Code:   HYDROcodone-acetaminophen ; Order Dt/Tm:   10/03/2019 03:25:34 EDT          amoxicillin-clavulanate  :   amoxicillin-clavulanate ; Status:   Prescribed ; Ordered As Mnemonic:   Augmentin 875 mg-125 mg oral tablet ; Simple Display Line:   1 tabs, Oral, BID, for 10 days, 20 tabs, 0 Refill(s) ; Ordering Provider:   Blossom Hoops L-MD; Catalog Code:   amoxicillin-clavulanate ; Order Dt/Tm:   10/03/2019 03:24:38 EDT          HYDROcodone-acetaminophen  :   HYDROcodone-acetaminophen ; Status:   Discontinued ; Ordered As Mnemonic:   Norco 325 mg-5 mg oral tablet ; Simple Display Line:   1 tabs, Oral, q6hr, for 3 days, PRN: moderate pain (4-7), 12 tabs, 0 Refill(s) ; Ordering Provider:   Blossom Hoops L-MD; Catalog Code:   HYDROcodone-acetaminophen ; Order Dt/Tm:   10/03/2019 03:24:33 EDT          ondansetron  :   ondansetron ; Status:   Prescribed ; Ordered As Mnemonic:   Zofran 4 mg oral tablet ; Simple Display Line:   4 mg, 1 tabs, Oral, q8hr, PRN: as needed for nausea/vomiting, 10 tabs, 0 Refill(s) ; Ordering Provider:   Oneida Arenas; Catalog Code:   ondansetron ; Order Dt/Tm:   04/23/2019 13:51:14 EST            Home Meds  ferrous sulfate  :   ferrous sulfate ; Status:   Documented ; Ordered As Mnemonic:   ferrous sulfate 325 mg (65 mg elemental iron) oral delayed release tablet ; Simple Display Line:   325 mg, 1 tabs, Oral, Daily, 0 Refill(s) ; Catalog Code:   ferrous sulfate ; Order Dt/Tm:   04/20/2019 15:52:25 EST ; Comment:   DO NOT CRUSH          metoclopramide  :   metoclopramide ; Status:   Documented ; Ordered As Mnemonic:   Reglan 10 mg oral tablet ; Simple Display Line:   10 mg, 1 tabs, Oral, TID, 0 Refill(s) ; Catalog Code:   metoclopramide ; Order Dt/Tm:   04/20/2019 15:52:57 EST           cholecalciferol  :   cholecalciferol ; Status:   Documented ; Ordered As Mnemonic:   Vitamin D3 ; Simple Display Line:   1 caps, Oral, Daily, 0 Refill(s) ; Catalog Code:   cholecalciferol ; Order Dt/Tm:   04/20/2019 15:51:54 EST          DULoxetine  :   DULoxetine ; Status:   Documented ; Ordered As Mnemonic:   DULoxetine 60 mg oral delayed release capsule ; Simple Display Line:   60 mg, 1 caps, Oral, qAM, 0 Refill(s) ; Catalog Code:   DULoxetine ; Order Dt/Tm:   04/20/2019 15:53:31 EST          mesalamine  :   mesalamine ; Status:   Documented ; Ordered As Mnemonic:   mesalamine 0.375 g oral capsule, extended release ; Simple Display Line:   1.5 g, 4 cap, Oral, qAM, 0 Refill(s) ; Catalog Code:   mesalamine ; Order Dt/Tm:   04/20/2019 15:53:31 EST          multivitamin with minerals  :   multivitamin with minerals ; Status:   Documented ; Ordered As Mnemonic:   Multi-Day Plus Minerals oral tablet ; Simple Display Line:   1 tabs, Oral, Daily, 0 Refill(s) ; Catalog Code:   multivitamin with minerals ; Order Dt/Tm:   04/20/2019 15:51:33 EST            ED Reason for Visit   (As Of: 10/03/2019 13:50:00 EDT)   Problems(Active)    Anxiety and depression (SNOMED CT  :478295621 )  Name of Problem:   Anxiety and depression ; Recorder:   Maudie Flakes, RN, Neldon Newport; Confirmation:   Confirmed ; Classification:   Patient Stated ; Code:   [id number] ; Contributor System:   PowerChart ; Last Updated:   04/20/2019 15:48 EST ; Life Cycle Date:   04/20/2019 ; Life Cycle Status:   Active ; Vocabulary:   SNOMED CT        Biliary dyskinesia (SNOMED CT  :962952841 )  Name of Problem:   Biliary dyskinesia ; Recorder:   Maudie Flakes, RN, Neldon Newport; Confirmation:   Confirmed ; Classification:   Patient Stated ; Code:   [id number] ; Contributor System:   Dietitian ; Last Updated:   04/20/2019 15:47 EST ; Life Cycle Date:   04/20/2019 ; Life Cycle Status:   Active ; Vocabulary:   SNOMED CT        Colitis (SNOMED CT  :253664403 )  Name of Problem:   Colitis ; Recorder:    [name], RN, [name]; Confirmation:   Confirmed ; Classification:   Patient Stated ; Code:   [id number] ; Contributor System:   Dietitian ; Last Updated:  12/04/2018 18:37 EDT ; Life Cycle Date:   12/04/2018 ; Life Cycle Status:   Active ; Vocabulary:   SNOMED CT        Crohn disease (SNOMED CT  :09811914 )  Name of Problem:   Crohn disease ; Recorder:   Earl Lites, RN, [name]; Confirmation:   Confirmed ; Classification:   Patient Stated ; Code:   78295621 ; Contributor System:   PowerChart ; Last Updated:   12/04/2018 17:50 EDT ; Life Cycle Date:   12/04/2018 ; Life Cycle Status:   Active ; Vocabulary:   SNOMED CT        Panic attacks (SNOMED CT  :308657846 )  Name of Problem:   Panic attacks ; Recorder:   Maudie Flakes, RN, Neldon Newport; Confirmation:   Confirmed ; Classification:   Patient Stated ; Code:   962952841 ; Contributor System:   Dietitian ; Last Updated:   04/20/2019 15:49 EST ; Life Cycle Date:   04/20/2019 ; Life Cycle Status:   Active ; Vocabulary:   SNOMED CT        PCOS (polycystic ovarian syndrome) (SNOMED CT  :324401027 )  Name of Problem:   PCOS (polycystic ovarian syndrome) ; Recorder:   Maudie Flakes, RN, Neldon Newport; Confirmation:   Confirmed ; Classification:   Patient Stated ; Code:   253664403 ; Contributor System:   Dietitian ; Last Updated:   04/20/2019 15:49 EST ; Life Cycle Date:   04/20/2019 ; Life Cycle Status:   Active ; Vocabulary:   SNOMED CT          Diagnoses(Active)    Mouth pain  Date:   10/03/2019 ; Diagnosis Type:   Reason For Visit ; Confirmation:   Complaint of ; Clinical Dx:   Mouth pain ; Classification:   Medical ; Clinical Service:   Emergency medicine ; Code:   PNED ; Probability:   0 ; Diagnosis Code:   [id number]

## 2019-10-03 NOTE — ED Notes (Signed)
ED Triage Note       ED Secondary Triage Entered On:  10/03/2019 2:56 EDT    Performed On:  10/03/2019 2:56 EDT by Lorel Monaco               General Information   Barriers to Learning :   None evident   ED Home Meds Section :   Document assessment   [facility] ED Fall Risk Section :   Document assessment   ED Advance Directives Section :   Document assessment   ED Palliative Screen :   N/A (prefilled for <32yo)   Lorel Monaco - 10/03/2019 2:56 EDT   (As Of: 10/03/2019 02:56:46 EDT)   Problems(Active)    Anxiety and depression (SNOMED CT  :009381829 )  Name of Problem:   Anxiety and depression ; Recorder:   Maudie Flakes, RN, Neldon Newport; Confirmation:   Confirmed ; Classification:   Patient Stated ; Code:   [id number] ; Contributor System:   PowerChart ; Last Updated:   04/20/2019 15:48 EST ; Life Cycle Date:   04/20/2019 ; Life Cycle Status:   Active ; Vocabulary:   SNOMED CT        Biliary dyskinesia (SNOMED CT  :938101751 )  Name of Problem:   Biliary dyskinesia ; Recorder:   Maudie Flakes, RN, Neldon Newport; Confirmation:   Confirmed ; Classification:   Patient Stated ; Code:   025852778 ; Contributor System:   Dietitian ; Last Updated:   04/20/2019 15:47 EST ; Life Cycle Date:   04/20/2019 ; Life Cycle Status:   Active ; Vocabulary:   SNOMED CT        Colitis (SNOMED CT  :242353614 )  Name of Problem:   Colitis ; Recorder:   [name], RN, [name]; Confirmation:   Confirmed ; Classification:   Patient Stated ; Code:   [id number] ; Contributor System:   Dietitian ; Last Updated:   12/04/2018 18:37 EDT ; Life Cycle Date:   12/04/2018 ; Life Cycle Status:   Active ; Vocabulary:   SNOMED CT        Crohn disease (SNOMED CT  :76195093 )  Name of Problem:   Crohn disease ; Recorder:   Earl Lites, RN, [name]; Confirmation:   Confirmed ; Classification:   Patient Stated ; Code:   26712458 ; Contributor System:   PowerChart ; Last Updated:   12/04/2018 17:50 EDT ; Life Cycle Date:   12/04/2018 ; Life Cycle Status:   Active ; Vocabulary:   SNOMED CT         Panic attacks (SNOMED CT  :099833825 )  Name of Problem:   Panic attacks ; Recorder:   Maudie Flakes, RN, Neldon Newport; Confirmation:   Confirmed ; Classification:   Patient Stated ; Code:   053976734 ; Contributor System:   Dietitian ; Last Updated:   04/20/2019 15:49 EST ; Life Cycle Date:   04/20/2019 ; Life Cycle Status:   Active ; Vocabulary:   SNOMED CT        PCOS (polycystic ovarian syndrome) (SNOMED CT  :193790240 )  Name of Problem:   PCOS (polycystic ovarian syndrome) ; Recorder:   Maudie Flakes, RN, Neldon Newport; Confirmation:   Confirmed ; Classification:   Patient Stated ; Code:   973532992 ; Contributor System:   Dietitian ; Last Updated:   04/20/2019 15:49 EST ; Life Cycle Date:   04/20/2019 ; Life Cycle Status:   Active ; Vocabulary:   SNOMED CT  Diagnoses(Active)    Dental pain  Date:   10/03/2019 ; Diagnosis Type:   Reason For Visit ; Confirmation:   Complaint of ; Clinical Dx:   Dental pain ; Classification:   Medical ; Clinical Service:   Emergency medicine ; Code:   PNED ; Probability:   0 ; Diagnosis Code:   [id number]             -    Procedure History   (As Of: 10/03/2019 02:56:46 EDT)     Anesthesia Minutes:   0 ; Procedure Name:   Colonoscopy ; Procedure Minutes:   0            Procedure Dt/Tm:   04/23/2019 12:29:00 EST ; Location:   [facility] OR ; Provider:   Durel Salts; Anesthesia Type:   General ; :   HUMSI-MD,  MICHAEL K; Anesthesia Minutes:   0 ; Procedure Name:   Cholecystectomy Laparoscopic ; Procedure Minutes:   21 ; Comments:     04/23/2019 13:15 EST - Bethann Goo, RN, Cathleen L  auto-populated from documented surgical case ; Clinical Service:   Surgery            Anesthesia Minutes:   0 ; Procedure Name:   Tonsillectomy ; Procedure Minutes:   0            Anesthesia Minutes:   0 ; Procedure Name:   CERVICAL LYMPHADENECTOMY ; Procedure Minutes:   0 ; Comments:     04/20/2019 15:45 EST - Maudie Flakes, RN, Neldon Newport  BENIGN            Anesthesia Minutes:   0 ; Procedure Name:   D &C ;  Procedure Minutes:   0            Anesthesia Minutes:   0 ; Procedure Name:   Cesarean section ; Procedure Minutes:   0            Anesthesia Minutes:   0 ; Procedure Name:   EGD - Esophagogastroduodenoscopy ; Procedure Minutes:   0            [facility] Fall Risk Assessment Tool   Hx of falling last 3 months ED Fall :   No   Patient confused or disoriented ED Fall :   No   Patient intoxicated or sedated ED Fall :   No   Patient impaired gait ED Fall :   No   Use a mobility assistance device ED Fall :   No   Patient altered elimination ED Fall :   No   [facility] ED Fall Score :   0    Lorel Monaco - 10/03/2019 2:56 EDT   ED Advance Directive   Advance Directive :   No   Lorel Monaco - 10/03/2019 2:56 EDT   Med Hx   Medication List   (As Of: 10/03/2019 02:56:46 EDT)   Prescription/Discharge Order    ondansetron  :   ondansetron ; Status:   Prescribed ; Ordered As Mnemonic:   Zofran 4 mg oral tablet ; Simple Display Line:   4 mg, 1 tabs, Oral, q8hr, PRN: as needed for nausea/vomiting, 10 tabs, 0 Refill(s) ; Ordering Provider:   Oneida Arenas; Catalog Code:   ondansetron ; Order Dt/Tm:   04/23/2019 13:51:14 EST            Home Meds    ferrous sulfate  :   ferrous sulfate ; Status:  Documented ; Ordered As Mnemonic:   ferrous sulfate 325 mg (65 mg elemental iron) oral delayed release tablet ; Simple Display Line:   325 mg, 1 tabs, Oral, Daily, 0 Refill(s) ; Catalog Code:   ferrous sulfate ; Order Dt/Tm:   04/20/2019 15:52:25 EST ; Comment:   DO NOT CRUSH          metoclopramide  :   metoclopramide ; Status:   Documented ; Ordered As Mnemonic:   Reglan 10 mg oral tablet ; Simple Display Line:   10 mg, 1 tabs, Oral, TID, 0 Refill(s) ; Catalog Code:   metoclopramide ; Order Dt/Tm:   04/20/2019 15:52:57 EST          cholecalciferol  :   cholecalciferol ; Status:   Documented ; Ordered As Mnemonic:   Vitamin D3 ; Simple Display Line:   1 caps, Oral, Daily, 0 Refill(s) ; Catalog Code:   cholecalciferol ; Order  Dt/Tm:   04/20/2019 15:51:54 EST          DULoxetine  :   DULoxetine ; Status:   Documented ; Ordered As Mnemonic:   DULoxetine 60 mg oral delayed release capsule ; Simple Display Line:   60 mg, 1 caps, Oral, qAM, 0 Refill(s) ; Catalog Code:   DULoxetine ; Order Dt/Tm:   04/20/2019 15:53:31 EST          mesalamine  :   mesalamine ; Status:   Documented ; Ordered As Mnemonic:   mesalamine 0.375 g oral capsule, extended release ; Simple Display Line:   1.5 g, 4 cap, Oral, qAM, 0 Refill(s) ; Catalog Code:   mesalamine ; Order Dt/Tm:   04/20/2019 15:53:31 EST          multivitamin with minerals  :   multivitamin with minerals ; Status:   Documented ; Ordered As Mnemonic:   Multi-Day Plus Minerals oral tablet ; Simple Display Line:   1 tabs, Oral, Daily, 0 Refill(s) ; Catalog Code:   multivitamin with minerals ; Order Dt/Tm:   04/20/2019 15:51:33 EST

## 2019-10-03 NOTE — ED Provider Notes (Signed)
dry socket pain*ED        Patient:   [name] [name], [name] [name]             MRN: [id number]            FIN: [id number]               Age:   34 years     Sex:  Female     DOB:  1985-09-17   Associated Diagnoses:   Dry tooth socket   Author:   Rennis Golden Y-MD      Basic Information   Time seen: Provider Seen (ST)   ED Provider/Time:    KWON,  MATTHEW Y-MD / 10/03/2019 13:46  .   Additional information: Chief Complaint from Nursing Triage Note   Chief Complaint  Chief Complaint: pt with dry socket. seen this am pain . pain meds not working (10/03/19 13:46:00).      History of Present Illness   The patient presents with 34 year old female past medical history of bilateral lower wisdom teeth removal on 7/26 presenting for the second time today for bilateral dry socket pain.  This started around 2 days ago and has been constant.  She received a bilateral dental block and Norco's until she can follow-up with her oral surgeon tomorrow morning.  She has been taking the Norco's and ibuprofen also given Augmentin with no change in symptoms thus bring her back in.  She denies any new traumas, events, or symptoms. no f/c. no bleeding or drainge..  The onset was 2  days ago.  The course/duration of symptoms is fluctuating in intensity.  Location: Bilateral tooth. The character of symptoms is pain.  The degree at onset was severe.  The degree at present is severe.        Review of Systems   Constitutional symptoms:  Negative except as documented in HPI.   Skin symptoms:  Negative except as documented in HPI.   Eye symptoms:  Negative except as documented in HPI.   ENMT symptoms:  Negative except as documented in HPI.   Respiratory symptoms:  Negative except as documented in HPI.   Cardiovascular symptoms:  Negative except as documented in HPI.   Gastrointestinal symptoms:  Negative except as documented in HPI.   Musculoskeletal symptoms:  Negative except as documented in HPI.   Neurologic symptoms:  Negative except as documented in HPI.    Psychiatric symptoms:  Negative except as documented in HPI.             Additional review of systems information: All other systems reviewed and otherwise negative.      Health Status   Allergies:    Allergic Reactions (Selected)  Severe  Doxycycline- Anaphylactic reaction.  Levaquin- Anaphylactic reaction..      Past Medical/ Family/ Social History   Medical history: Reviewed as documented in chart.   Surgical history: Reviewed as documented in chart.   Family history: Not significant.   Social history: Reviewed as documented in chart.   Problem list:    Active Problems (6)  Anxiety and depression   Biliary dyskinesia   Colitis   Crohn disease   Panic attacks   PCOS (polycystic ovarian syndrome)   .      Physical Examination               Vital Signs   Vital Signs   10/03/2019 14:14 EDT Heart Rate Monitored 86 bpm   10/03/2019 13:46 EDT Systolic Blood Pressure 118  mmHg    Diastolic Blood Pressure 88 mmHg    Temperature Oral 36.7 degC    Heart Rate Monitored 86 bpm    Respiratory Rate 14 br/min    SpO2 99 %   10/03/2019 2:53 EDT Systolic Blood Pressure 124 mmHg    Diastolic Blood Pressure 88 mmHg    Temperature Oral 36.8 degC    Heart Rate Monitored 102 bpm  HI    Respiratory Rate 16 br/min    SpO2 97 %   .   Measurements   10/03/2019 13:50 EDT Body Mass Index est meas 33.63 kg/m2    Body Mass Index Measured 33.63 kg/m2   10/03/2019 13:46 EDT Height/Length Measured 160 cm    Weight Dosing 86.1 kg   10/03/2019 2:56 EDT Body Mass Index est meas 32.03 kg/m2    Body Mass Index Measured 32.03 kg/m2   10/03/2019 2:53 EDT Height/Length Measured 160 cm    Weight Dosing 82 kg   .   Basic Oxygen Information   10/03/2019 13:46 EDT Oxygen Therapy Room air    SpO2 99 %   10/03/2019 2:53 EDT SpO2 97 %   .   General:  Alert, no acute distress, Not ill-appearing,    Skin:  Warm, dry.    Head:  Normocephalic.   Neck:  Supple.   Eye:  Normal conjunctiva, vision grossly normal.    Ears, nose, mouth and throat:  Oral mucosa moist, no pharyngeal  erythema or exudate, Pain over her bilateral dry sockets on the lower posterior mandibles.    Cardiovascular:  Normal peripheral perfusion, No edema.    Respiratory:  Respirations are non-labored, Symmetrical chest wall expansion.    Chest wall:  No deformity.   Back:  Normal range of motion.   Musculoskeletal:  Normal ROM, normal strength.    Gastrointestinal:  Non distended.   Neurological:  Normal motor observed, normal speech observed.    Psychiatric:  Cooperative, appropriate mood & affect.       Medical Decision Making   Documents reviewed:  Emergency department nurses' notes, emergency department records, prior records.       Reexamination/ Reevaluation   Vital signs   Basic Oxygen Information   10/03/2019 13:46 EDT Oxygen Therapy Room air    SpO2 99 %   10/03/2019 2:53 EDT SpO2 97 %      Patient has no evidence of bleeding over the dry sockets.  I placed bupivacaine on  gauze and attempted to soak the area and pack it but patient continues to have pain.  We decided on an additional inferior alveolar block bilaterally which patient tolerated well and had full resolution of symptoms. no bleeding on complications afterwards.      She does have follow-up tomorrow with her oral surgeon but a list of dental clinics provided just in case.  She is already on Augmentin and strong pain medications and has enough to last till tomorrow. stable for dc.      Impression and Plan   Diagnosis   Dry tooth socket (ICD10-CM M27.3, Discharge, Medical)   Plan   Condition: Improved.    Disposition: Discharged: to home.    Prescriptions: [facility] prescriptions   Pharmacy:  Lidocaine Viscous 2% mucous membrane solution (Prescribe): 0.1 g, 5 mL, Topical, QIDACHS, for 7 days, PRN: mouth sore pain, 100 mL, 0 Refill(s).    Patient was given the following educational materials: Free Dental Clinics WINDOW 10 (CUSTOM), Dental Dry Socket, Easy-to-Read, Dental Dry Socket, Easy-to-Read, Free  Dental Clinics WINDOW 10 (CUSTOM).    Follow up with:  Primary care physician or clinic Within 2 to 4 days Please follow up with your primary care doctor as soon as possible.    If you do not have a primary care doctor, please call [facility] Referral line at [phone]    Please return for any worsening or new concerning symptoms.; YOUR DENTIST Within 1 to 2 days Please follow-up with your dentist tomorrow.      We put a list of free dental clinics to call as well if you cannot schedule one tomorrow.    Return for any worsening or new concerning symptoms..    Counseled: Patient, Regarding diagnosis, Regarding treatment plan, Regarding prescription, Patient indicated understanding of instructions.    Signature Line     Electronically Signed on 10/03/2019 06:23 PM EDT   ________________________________________________   Rennis Golden Y-MD               Modified by: Rennis Golden Y-MD on 10/03/2019 06:23 PM EDT

## 2019-10-03 NOTE — ED Provider Notes (Signed)
Dental Pain *ED        Patient:   [name] [name], [name] [name]             MRN: [id number]            FIN: [id number]               Age:   34 years     Sex:  Female     DOB:  [birth year]   Associated Diagnoses:   Dry tooth socket; H/O wisdom tooth extraction   Author:   Blossom Hoops L-MD      Basic Information   Time seen: Provider Seen (ST)   ED Provider/Time:    Blossom Hoops L-MD / 10/03/2019 02:51  .   History source: Patient.   Arrival mode: Private vehicle.   History limitation: None.   Additional information: Chief Complaint from Nursing Triage Note   Chief Complaint  Chief Complaint: WISDOM TOOTH EXTRACTION ON MONDAY. STATES BILATERAL LOWER JAW PAIN X 2 DAYS. UNABLE TO REACH ORAL SURGEON. UNRELIEVED WITH IBUPROFEN 800MG  AND LORTAB. (10/03/19 02:53:00).      History of Present Illness   34 year old female presenting with complaint of dental pain.  She reports that she had her lower wisdom teeth extracted earlier on this week.  She reports that she has been having persistent discomfort associated with both surgical sites over the past 2 days.  She reports that she has been trying to contact her oral surgeon but reportedly did not get a response.  She has been trying saline swishes, the Norco that she was prescribed, oil of clove.  She reports no other complaints or concerns at this time.  Denies fever.  Denies swelling to her neck or her posterior oropharynx..        Review of Systems   Constitutional symptoms:  No fever, no chills.    ENMT symptoms:  No sore throat, no nasal congestion.    Respiratory symptoms:  No shortness of breath, no stridor.              Additional review of systems information: All other systems reviewed and otherwise negative.      Health Status   Allergies:    Allergic Reactions (Selected)  Severe  Doxycycline- Anaphylactic reaction.  Levaquin- Anaphylactic reaction..   Medications:  (Selected)   Prescriptions  Prescribed  Augmentin 875 mg-125 mg oral tablet: 1 tabs, Oral, BID, for 10 days, 20  tabs, 0 Refill(s)  Norco 325 mg-5 mg oral tablet: 1 tabs, Oral, q6hr, for 3 days, PRN: moderate pain (4-7), 7 tabs, 0 Refill(s)  Zofran 4 mg oral tablet: 4 mg, 1 tabs, Oral, q8hr, PRN: as needed for nausea/vomiting, 10 tabs, 0 Refill(s)  Documented Medications  Documented  DULoxetine 60 mg oral delayed release capsule: 60 mg, 1 caps, Oral, qAM, 0 Refill(s)  Multi-Day Plus Minerals oral tablet: 1 tabs, Oral, Daily, 0 Refill(s)  Reglan 10 mg oral tablet: 10 mg, 1 tabs, Oral, TID, 0 Refill(s)  Vitamin D3: 1 caps, Oral, Daily, 0 Refill(s)  ferrous sulfate 325 mg (65 mg elemental iron) oral delayed release tablet: 325 mg, 1 tabs, Oral, Daily, 0 Refill(s)  mesalamine 0.375 g oral capsule, extended release: 1.5 g, 4 cap, Oral, qAM, 0 Refill(s).      Past Medical/ Family/ Social History   Surgical history: Reviewed as documented in chart.   Social history:    Social & Psychosocial Habits    Alcohol  12/04/2018  Use: Denies  Substance Use  04/20/2019  Opioid Assessment Opioid Naive-not taking    Use: Denies    Tobacco  04/20/2019  Use: Former smoker, quit more    Comment: QUIT 02/2019 - 04/20/2019 15:48 - Maudie Flakes, RN, Neldon Newport    Electronic Cigarette/Vaping  04/20/2019  Electronic Cigarette Use: Never  .   Problem list:    Active Problems (6)  Anxiety and depression   Biliary dyskinesia   Colitis   Crohn disease   Panic attacks   PCOS (polycystic ovarian syndrome)   .      Physical Examination               Vital Signs   Vital Signs   10/03/2019 2:53 EDT Systolic Blood Pressure 124 mmHg    Diastolic Blood Pressure 88 mmHg    Temperature Oral 36.8 degC    Heart Rate Monitored 102 bpm  HI    Respiratory Rate 16 br/min    SpO2 97 %   .   Measurements   10/03/2019 2:56 EDT Body Mass Index est meas 32.03 kg/m2    Body Mass Index Measured 32.03 kg/m2   10/03/2019 2:53 EDT Height/Length Measured 160 cm    Weight Dosing 82 kg   .   General:  Alert, no acute distress, well nourished.    Skin:  Warm, dry, intact, no rash.    Head:   Normocephalic, atraumatic.    Neck:  Supple, trachea midline, no tenderness.    Eye:  Pupils are equal, round and reactive to light, normal conjunctiva.    Ears, nose, mouth and throat:  Patient has evidence of bilateral dry socket involving the lower mandible.   Neurological:  No focal neurological deficit observed, normal speech observed.    Psychiatric:  Cooperative, appropriate mood & affect, normal judgment.       Medical Decision Making   Rationale:    34 year old female presenting with complaint of dental pain  She appears to be suffering from dry socket involving both of her with some tooth extraction sites  No evidence of Ludwig's, vital signs stable, posterior oropharynx clear with no evidence of erythema or edema  We will provide her a dental block, started on antibiotics, discuss further pain control options with her  .   Documents reviewed:  Emergency department nurses' notes, emergency department records, prior records.       Reexamination/ Reevaluation   Time: 10/03/2019 03:55:00 .   Vital signs   results included from flowsheet : Vital Signs   10/03/2019 2:53 EDT Systolic Blood Pressure 124 mmHg    Diastolic Blood Pressure 88 mmHg    Temperature Oral 36.8 degC    Heart Rate Monitored 102 bpm  HI    Respiratory Rate 16 br/min    SpO2 97 %      Course: improving.   Pain status: decreased.   Notes: Patient feels improved with the dental blocks provided.  We will start her on antibiotics.  Encouraged her to follow-up with her oral surgeon on Monday.  We discussed additional over-the-counter treatments available for dental pain.  Patient was agreeable with this.  She was otherwise comfortable discharge home.  She was welcome to return anytime for any worsening symptoms..      Procedure   Procedure notes:     Bilateral inferior alveolar blocks:  Patient underwent bilateral inferior alveolar nerve block with 0.5% bupivacaine.  Anatomic landmarks were identified and a 25-gauge needle was used to inject  approximately 1/2 cc  of bupivacaine bilaterally.  Gums were massaged and reassessed with no evidence of persistent bleeding.  No complications associated with the procedure.  Patient tolerated well.      Impression and Plan   Diagnosis   Dry tooth socket (ICD10-CM M27.3, Discharge, Medical)   H/O wisdom tooth extraction (ICD10-CM K08.409, Discharge, Medical)   Plan   Condition: Improved, Stable.    Disposition: Medically cleared, Discharged: to home.    Prescriptions: Launch prescriptions   Pharmacy:  Norco 325 mg-5 mg oral tablet (Prescribe): 1 tabs, Oral, q6hr, for 3 days, PRN: moderate pain (4-7), 7 tabs, 0 Refill(s).    Patient was given the following educational materials: Dental Dry Socket.    Follow up with: Follow up with primary care provider Within 1 to 2 days Follow-up with your primary care doctor for reevaluation regarding your symptoms.  If you have any worsening symptoms please return immediately to the ER.  Please take the medications given to you as prescribed.  Thank you for your visit to the emergency department today..    Counseled: Patient, Regarding diagnosis, Regarding diagnostic results, Regarding treatment plan, Regarding prescription, Patient indicated understanding of instructions.    Signature Line     Electronically Signed on 10/03/2019 03:58 AM EDT   ________________________________________________   Blossom Hoops L-MD               Modified by: Blossom Hoops L-MD on 10/03/2019 03:58 AM EDT

## 2019-10-03 NOTE — ED Notes (Signed)
ED Triage Note       ED Secondary Triage Entered On:  10/03/2019 14:18 EDT    Performed On:  10/03/2019 14:17 EDT by Lequita Halt, RN, Diane C               General Information   Barriers to Learning :   None evident   COVID-19 Vaccine Status :   Not received   ED Home Meds Section :   Document assessment   [facility] ED Fall Risk Section :   Document assessment   ED Advance Directives Section :   Document assessment   ED Palliative Screen :   N/A (prefilled for <65yo)   Lequita Halt RN, Diane C - 10/03/2019 14:17 EDT   (As Of: 10/03/2019 14:18:05 EDT)   Problems(Active)    Anxiety and depression (SNOMED CT  :161096045 )  Name of Problem:   Anxiety and depression ; Recorder:   Maudie Flakes, RN, Neldon Newport; Confirmation:   Confirmed ; Classification:   Patient Stated ; Code:   [id number] ; Contributor System:   PowerChart ; Last Updated:   04/20/2019 15:48 EST ; Life Cycle Date:   04/20/2019 ; Life Cycle Status:   Active ; Vocabulary:   SNOMED CT        Biliary dyskinesia (SNOMED CT  :782956213 )  Name of Problem:   Biliary dyskinesia ; Recorder:   Maudie Flakes, RN, Neldon Newport; Confirmation:   Confirmed ; Classification:   Patient Stated ; Code:   [id number] ; Contributor System:   Dietitian ; Last Updated:   04/20/2019 15:47 EST ; Life Cycle Date:   04/20/2019 ; Life Cycle Status:   Active ; Vocabulary:   SNOMED CT        Colitis (SNOMED CT  :629528413 )  Name of Problem:   Colitis ; Recorder:   [name], RN, [name]; Confirmation:   Confirmed ; Classification:   Patient Stated ; Code:   [id number] ; Contributor System:   Dietitian ; Last Updated:   12/04/2018 18:37 EDT ; Life Cycle Date:   12/04/2018 ; Life Cycle Status:   Active ; Vocabulary:   SNOMED CT        Crohn disease (SNOMED CT  :53664403 )  Name of Problem:   Crohn disease ; Recorder:   Earl Lites, RN, [name]; Confirmation:   Confirmed ; Classification:   Patient Stated ; Code:   [id number] ; Contributor System:   PowerChart ; Last Updated:   12/04/2018 17:50 EDT ; Life Cycle Date:   12/04/2018 ; Life Cycle  Status:   Active ; Vocabulary:   SNOMED CT        Panic attacks (SNOMED CT  :387564332 )  Name of Problem:   Panic attacks ; Recorder:   Maudie Flakes, RN, Neldon Newport; Confirmation:   Confirmed ; Classification:   Patient Stated ; Code:   [id number] ; Contributor System:   Dietitian ; Last Updated:   04/20/2019 15:49 EST ; Life Cycle Date:   04/20/2019 ; Life Cycle Status:   Active ; Vocabulary:   SNOMED CT        PCOS (polycystic ovarian syndrome) (SNOMED CT  :063016010 )  Name of Problem:   PCOS (polycystic ovarian syndrome) ; Recorder:   Maudie Flakes, RN, Neldon Newport; Confirmation:   Confirmed ; Classification:   Patient Stated ; Code:   [id number] ; Contributor System:   Dietitian ; Last Updated:   04/20/2019 15:49 EST ; Life Cycle Date:   04/20/2019 ; Life Cycle Status:  Active ; Vocabulary:   SNOMED CT          Diagnoses(Active)    Mouth pain  Date:   10/03/2019 ; Diagnosis Type:   Reason For Visit ; Confirmation:   Complaint of ; Clinical Dx:   Mouth pain ; Classification:   Medical ; Clinical Service:   Emergency medicine ; Code:   PNED ; Probability:   0 ; Diagnosis Code:   [id number]             -    Procedure History   (As Of: 10/03/2019 14:18:05 EDT)     Anesthesia Minutes:   0 ; Procedure Name:   Colonoscopy ; Procedure Minutes:   0            Procedure Dt/Tm:   04/23/2019 12:29:00 EST ; Location:   [facility] OR ; Provider:   Durel Salts; Anesthesia Type:   General ; :   HUMSI-MD,  MICHAEL K; Anesthesia Minutes:   0 ; Procedure Name:   Cholecystectomy Laparoscopic ; Procedure Minutes:   21 ; Comments:     04/23/2019 13:15 EST - Bethann Goo, RN, Cathleen L  auto-populated from documented surgical case ; Clinical Service:   Surgery            Anesthesia Minutes:   0 ; Procedure Name:   Tonsillectomy ; Procedure Minutes:   0            Anesthesia Minutes:   0 ; Procedure Name:   CERVICAL LYMPHADENECTOMY ; Procedure Minutes:   0 ; Comments:     04/20/2019 15:45 EST - Maudie Flakes, RN, Neldon Newport  BENIGN            Anesthesia  Minutes:   0 ; Procedure Name:   D &C ; Procedure Minutes:   0            Anesthesia Minutes:   0 ; Procedure Name:   Cesarean section ; Procedure Minutes:   0            Anesthesia Minutes:   0 ; Procedure Name:   EGD - Esophagogastroduodenoscopy ; Procedure Minutes:   0            [facility] Fall Risk Assessment Tool   Hx of falling last 3 months ED Fall :   No   Tressa Busman - 10/03/2019 14:17 EDT   ED Advance Directive   Advance Directive :   No   Lequita Halt, RN, Diane C - 10/03/2019 14:17 EDT

## 2019-10-03 NOTE — Discharge Summary (Signed)
ED Clinical Summary                     [facility]  [address]  [place], [place], [address]  [phone]          PERSON INFORMATION  Name: [name] [name], [name] [name] Age:  34 Years DOB: 1985-09-20   Sex: Female Language: English PCP: [name]   Marital Status: Single Phone: [phone] Med Service: MED-Medicine   MRN: [id number] Acct# [id number] Arrival: 10/03/2019 13:26:00   Visit Reason: Mouth pain; TOOTH PAIN Acuity: 4 LOS: 000 01:09   Address:    [address] [place] [address]   Diagnosis:    Dry tooth socket  Medications:          New Medications  Printed Prescriptions  lidocaine topical (Lidocaine Viscous 2% mucous membrane solution) 5 Milliliter Topical (on the skin) four times a day (before meals and at bedtime) as needed mouth sore pain for 7 Days. Refills: 0.  Last Dose:____________________  Medications that have not changed  Other Medications  amoxicillin-clavulanate (Augmentin 875 mg-125 mg oral tablet) 1 Tabs Oral (given by mouth) 2 times a day for 10 Days. Refills: 0.  Last Dose:____________________  cholecalciferol (Vitamin D3) 1 Capsules Oral (given by mouth) every day.  Last Dose:____________________  DULoxetine (DULoxetine 60 mg oral delayed release capsule) 1 Capsules Oral (given by mouth) once a day (in the morning).  Last Dose:____________________  ferrous sulfate (ferrous sulfate 325 mg (65 mg elemental iron) oral delayed release tablet) 1 Tabs Oral (given by mouth) every day., DO NOT CRUSH  Last Dose:____________________  HYDROcodone-acetaminophen (Norco 325 mg-5 mg oral tablet) 1 Tabs Oral (given by mouth) every 6 hours as needed moderate pain (4-7) for 3 Days. Refills: 0.  Last Dose:____________________  mesalamine (mesalamine 0.375 g oral capsule, extended release) 4 Capsules Oral (given by mouth) once a day (in the morning).  Last Dose:____________________  metoclopramide (Reglan 10 mg oral tablet) 1 Tabs Oral (given by  mouth) 3 times a day.  Last Dose:____________________  multivitamin with minerals (Multi-Day Plus Minerals oral tablet) 1 Tabs Oral (given by mouth) every day.  Last Dose:____________________  ondansetron (Zofran 4 mg oral tablet) 1 Tabs Oral (given by mouth) every 8 hours as needed as needed for nausea/vomiting. Refills: 0.  Last Dose:____________________      Medications Administered During Visit:                Medication Dose Route   bupivacaine 1 mL ID               Allergies      doxycycline (Anaphylactic reaction)      Levaquin (Anaphylactic reaction)      Major Tests and Procedures:  The following procedures and tests were performed during your ED visit.  COMMON PROCEDURES%>  COMMON PROCEDURES COMMENTS%>                PROVIDER INFORMATION               Provider Role Assigned Rosalee Kaufman, [place] [facility] ED Provider 10/03/2019 13:46:55    Lequita Halt RN, Cindie Crumbly ED Nurse 10/03/2019 13:47:45        Attending Physician:  DEFAULT,  DOCTOR      Admit Doc  DEFAULT,  DOCTOR     Consulting Doc       VITALS INFORMATION  Vital Sign Triage Latest   Temp Oral  ORAL_1%> ORAL%>   Temp Temporal TEMPORAL_1%> TEMPORAL%>   Temp Intravascular INTRAVASCULAR_1%> INTRAVASCULAR%>   Temp Axillary AXILLARY_1%> AXILLARY%>   Temp Rectal RECTAL_1%> RECTAL%>   02 Sat 99 % 99 %   Respiratory Rate RATE_1%> RATE%>   Peripheral Pulse Rate PULSE RATE_1%> PULSE RATE%>   Apical Heart Rate HEART RATE_1%> HEART RATE%>   Blood Pressure BLOOD PRESSURE_1%>/ BLOOD PRESSURE_1%>88 mmHg BLOOD PRESSURE%> / BLOOD PRESSURE%>88 mmHg                 Immunizations      No Immunizations Documented This Visit          DISCHARGE INFORMATION   Discharge Disposition: H Outpt-Sent Home   Discharge Location:  Home   Discharge Date and Time:  10/03/2019 14:35:24   ED Checkout Date and Time:  10/03/2019 14:35:24     DEPART REASON INCOMPLETE INFORMATION               Depart Action Incomplete Reason   Interactive View/I&O Recently assessed               Problems      No  Problems Documented              Smoking Status      Former smoker, quit more than 30 days ago         PATIENT EDUCATION INFORMATION  Instructions:     Dental Dry Socket, Easy-to-Read; Free Dental Clinics WINDOW 10 (CUSTOM)     Follow up:                   With: Address: When:   YOUR DENTIST  Within 1 to 2 days   Comments:   Please follow-up with your dentist tomorrow.     We put a list of free dental clinics to call as well if you cannot schedule one tomorrow.     Return for any worsening or new concerning symptoms.       With: Address: When:   primary care physician or clinic  Within 2 to 4 days   Comments:   Please follow up with your primary care doctor as soon as possible.     If you do not have a primary care doctor, please call [facility] Referral line at [phone]     Please return for any worsening or new concerning symptoms.              ED PROVIDER DOCUMENTATION

## 2019-10-03 NOTE — ED Notes (Signed)
ED Patient Summary       ;       [facility]  [address] [place], [place] [address]  [phone]  Discharge Instructions (Patient)  _______________________________________     Name: [name] [name], [name] [name]  DOB: 12-11-1985                   MRN: [id number]                   FIN: [id number]  Reason For Visit: Dental pain; DENTAL PAIN  Final Diagnosis: Dry tooth socket; H/O wisdom tooth extraction     Visit Date: 10/03/2019 02:50:00  Address: [address] [facility] [address] [place] [address]  Phone: [phone]     Emergency Department Providers:        Primary Physician:   [facility] would like to thank you for allowing [place] to assist you with your healthcare needs. The following includes patient education materials and information regarding your injury/illness.     Follow-up Instructions:  You were seen today on an emergency basis. Please contact your primary care doctor for a follow up appointment. If you received a referral to a specialist doctor, it is important you follow-up as instructed.    It is important that you call your follow-up doctor to schedule and confirm the location of your next appointment. Your doctor may practice at multiple locations. The office location of your follow-up appointment may be different to the one written on your discharge instructions.    If you do not have a primary care doctor, please call [phone] for help in finding a Sarina Ser. [facility] Provider. For help in finding a specialist doctor, please call [phone].    If your condition gets worse before your follow-up with your primary care doctor or specialist, please return to the Emergency Department.      Coronavirus 2019 (COVID-19) Reminders:     Patients age 72 - 39, with parental consent, and patients over age 80 can make an appointment for a COVID-19 vaccine. Patients can contact their [facility] doctors' offices to  schedule an appointment to receive the COVID-19 vaccine. Patients who do not have a [facility] physician can call [phone] to schedule vaccination appointments.      Follow Up Appointments:  Primary Care Provider:      Name: [name] [name]      Phone: [phone]                 With: Address: When:   Follow up with primary care provider  Within 1 to 2 days   Comments:   Follow-up with your primary care doctor for reevaluation regarding your symptoms. If you have any worsening symptoms please return immediately to the ER. Please take the medications given to you as prescribed. Thank you for your visit to the emergency department today.              Printed Prescriptions:    Patient Education Materials:  Discharge Orders          Discharge Patient 10/03/19 3:44:00 EDT         Comment:      Dental Dry Socket     Dental Dry Socket    Dry socket is a condition that can occur after a tooth is  pulled (extracted). After  a tooth is extracted, blood fills up the hole (socket) where the tooth had been. Normally, this blood hardens (clots). That protects the bone and nerves underneath until the gums grow over the open socket.    Dry socket occurs when a blood clot does not form or is lost for some reason. Without the blood clot, the bone and nerves are exposed to air, food, liquid, or anything else that enters the mouth. This can be very painful.      CAUSES    This condition may be caused by:     Blood not filling up the socket properly. This can occur if the extraction was difficult. Forceful pushing against the walls of the socket when the tooth is extracted can crush the walls. This keeps blood from flowing into the socket because the blood vessels have been crushed closed.     Anything that can dislodge a blood clot that is forming. For example, forceful spitting or sucking through a straw can pull a blood clot out of the socket.    RISK FACTORS    The following factors may make you more  likely to develop this condition:     Having lower teeth extracted.     Smoking.     Drinking alcohol.     Being female.     Being older in age.     Having poor oral hygiene.     Gum disease or an oral bacterial infection.     Taking birth control pills.     Having your wisdom teeth extracted.    SYMPTOMS    Symptoms of this condition include:     Severe, constant pain that is dull and throbbing. The pain often begins 2?3 days after your tooth was extracted. The pain may last about one week.     Bad-smelling breath and a bad taste in your mouth.     Ear pain.     Exposed white bone in the tooth socket.     Low-grade fever.    DIAGNOSIS    This condition is diagnosed based on your symptoms and an examination of the inside of your mouth. Your dentist will check to see if you have a blood clot in your tooth socket or if your bone is visible. You may also have imaging studies, including X-rays of your mouth and teeth. These may be done to rule out other health issues, such as a bone infection (osteomyelitis) or a problem with nearby teeth.    TREATMENT    This condition may clear up on its own after 1?2 weeks. Treatment may include antibiotic medicine. You may also get treatment for pain, which may include:     Soaking a gauze pad in a topical pain medicine then placing it inside your tooth socket. This pad (dressing) may need to be replaced every 1?3 days until your pain eases.     Taking NSAIDs such as ibuprofen.    HOME CARE INSTRUCTIONS     Take over-the-counter and prescription medicines only as told by your health care provider or dentist.     If you were prescribed an antibiotic medicine, take it as told by your health care provider or dentist. Do not stop taking the antibiotic even if you start to feel better.     Brush and floss your teeth every morning and night. Good oral hygiene is important.     Do not drink carbonated beverages or alcohol.     Do not  use tobacco products, including cigarettes, chewing  tobacco, or e-cigarettes. If you need help quitting, ask your health care provider.     Keep all follow-up visits as told by your dentist. This is important.    SEEK MEDICAL CARE IF:     You have pain that is not helped by medicine.     You have a fever.     Your mouth becomes tender and swollen. This could be a sign of infection.    SEEK IMMEDIATE MEDICAL CARE IF:     You have uncontrolled bleeding, very noticeable swelling, or severe pain.     You have difficulty swallowing.     You cannot open your mouth.    This information is not intended to replace advice given to you by your health care provider. Make sure you discuss any questions you have with your health care provider.    Document Released: 08/25/2002 Document Revised: 01/30/2015 Document Reviewed: 10/03/2014  Elsevier Interactive Patient Education ?2016 Elsevier Inc.         Allergy Info: Levaquin; doxycycline     Medication Information:  [facility] provided you with a complete list of medications post discharge, if you have been instructed to stop taking a medication please ensure you also follow up with this information to your Primary Care Physician.  Unless otherwise noted, patient will continue to take medications as prescribed prior to the Emergency Room visit.  Any specific questions regarding your chronic medications and dosages should be discussed with your physician(s) and pharmacist.          amoxicillin-clavulanate (Augmentin 875 mg-125 mg oral tablet) 1 Tabs Oral (given by mouth) 2 times a day for 10 Days. Refills: 0.  cholecalciferol (Vitamin D3) 1 Capsules Oral (given by mouth) every day.  DULoxetine (DULoxetine 60 mg oral delayed release capsule) 1 Capsules Oral (given by mouth) once a day (in the morning).  ferrous sulfate (ferrous sulfate 325 mg (65 mg elemental iron) oral delayed release tablet) 1 Tabs Oral (given by mouth) every day., DO NOT CRUSH  HYDROcodone-acetaminophen (Norco 325 mg-5 mg oral tablet) 1  Tabs Oral (given by mouth) every 6 hours as needed moderate pain (4-7) for 3 Days. Refills: 0.  mesalamine (mesalamine 0.375 g oral capsule, extended release) 4 Capsules Oral (given by mouth) once a day (in the morning).  metoclopramide (Reglan 10 mg oral tablet) 1 Tabs Oral (given by mouth) 3 times a day.  multivitamin with minerals (Multi-Day Plus Minerals oral tablet) 1 Tabs Oral (given by mouth) every day.  ondansetron (Zofran 4 mg oral tablet) 1 Tabs Oral (given by mouth) every 8 hours as needed as needed for nausea/vomiting. Refills: 0.      Medications Administered During Visit:              Medication Dose Route   ketorolac 30 mg IM   HYDROcodone-acetaminophen 1 tabs Oral   bupivacaine 1 mL ID          Major Tests and Procedures:  The following procedures and tests were performed during your Emergency Room visit.  COMMON PROCEDURES%>  COMMON PROCEDURES COMMENTS%>          Laboratory Orders  No laboratory orders were placed.              Radiology Orders  No radiology orders were placed.              Patient Care Orders  Name Status Details   Discharge  Patient Ordered 10/03/19 3:44:00 EDT   ED Assessment Adult Ordered 10/03/19 2:56:18 EDT, 10/03/19 2:56:18 EDT   ED Secondary Triage Completed 10/03/19 2:56:18 EDT, 10/03/19 2:56:18 EDT   ED Triage Adult Completed 10/03/19 2:50:49 EDT, 10/03/19 2:50:49 EDT       ---------------------------------------------------------------------------------------------------------------------  [facility]) encourages you to self-enroll in the [facility] Patient Portal.  [facility] Patient Portal will allow you to manage your personal health information securely from your own electronic device now and in the future.  To begin your Patient Portal enrollment process, please visit [email or web address] Click on "Sign up now" under [facility].  If you find that you need additional assistance on the [facility] Patient Portal or  need a copy of your medical records, please call the [facility] Medical Records Office at [phone].  Comment:

## 2019-10-03 NOTE — Discharge Summary (Signed)
ED Clinical Summary                     [facility]  [address]  [place], [place], [address]  [phone]          PERSON INFORMATION  Name: [name] [name], [name] [name] Age:  34 Years DOB: Jul 08, 1985   Sex: Female Language: English PCP: [name]   Marital Status: Single Phone: [phone] Med Service: MED-Medicine   MRN: [id number] Acct# [id number] Arrival: 10/03/2019 02:50:00   Visit Reason: Dental pain; DENTAL PAIN Acuity: 4 LOS: 000 01:06   Address:    [address] [place] [address]   Diagnosis:    Dry tooth socket; H/O wisdom tooth extraction  Medications:          New Medications  Printed Prescriptions  amoxicillin-clavulanate (Augmentin 875 mg-125 mg oral tablet) 1 Tabs Oral (given by mouth) 2 times a day for 10 Days. Refills: 0.  Last Dose:____________________  HYDROcodone-acetaminophen (Norco 325 mg-5 mg oral tablet) 1 Tabs Oral (given by mouth) every 6 hours as needed moderate pain (4-7) for 3 Days. Refills: 0.  Last Dose:____________________  Medications that have not changed  Other Medications  cholecalciferol (Vitamin D3) 1 Capsules Oral (given by mouth) every day.  Last Dose:____________________  DULoxetine (DULoxetine 60 mg oral delayed release capsule) 1 Capsules Oral (given by mouth) once a day (in the morning).  Last Dose:____________________  ferrous sulfate (ferrous sulfate 325 mg (65 mg elemental iron) oral delayed release tablet) 1 Tabs Oral (given by mouth) every day., DO NOT CRUSH  Last Dose:____________________  mesalamine (mesalamine 0.375 g oral capsule, extended release) 4 Capsules Oral (given by mouth) once a day (in the morning).  Last Dose:____________________  metoclopramide (Reglan 10 mg oral tablet) 1 Tabs Oral (given by mouth) 3 times a day.  Last Dose:____________________  multivitamin with minerals (Multi-Day Plus Minerals oral tablet) 1 Tabs Oral (given by mouth) every day.  Last  Dose:____________________  ondansetron (Zofran 4 mg oral tablet) 1 Tabs Oral (given by mouth) every 8 hours as needed as needed for nausea/vomiting. Refills: 0.  Last Dose:____________________      Medications Administered During Visit:                Medication Dose Route   ketorolac 30 mg IM   HYDROcodone-acetaminophen 1 tabs Oral   bupivacaine 1 mL ID               Allergies      doxycycline (Anaphylactic reaction)      Levaquin (Anaphylactic reaction)      Major Tests and Procedures:  The following procedures and tests were performed during your ED visit.  COMMON PROCEDURES%>  COMMON PROCEDURES COMMENTS%>                PROVIDER INFORMATION               Provider Role Assigned Briscoe Burns, Arlys John L-MD ED Provider 10/03/2019 02:51:01    Lorel Monaco ED Nurse 10/03/2019 02:53:22        Attending Physician:  Blossom Hoops L-MD      Admit Doc  Blossom Hoops L-MD     Consulting Doc       VITALS INFORMATION  Vital Sign Triage Latest   Temp Oral ORAL_1%> ORAL%>   Temp Temporal TEMPORAL_1%> TEMPORAL%>   Temp Intravascular INTRAVASCULAR_1%> INTRAVASCULAR%>   Temp Axillary AXILLARY_1%> AXILLARY%>  Temp Rectal RECTAL_1%> RECTAL%>   02 Sat 97 % 97 %   Respiratory Rate RATE_1%> RATE%>   Peripheral Pulse Rate PULSE RATE_1%> PULSE RATE%>   Apical Heart Rate HEART RATE_1%> HEART RATE%>   Blood Pressure BLOOD PRESSURE_1%>/ BLOOD PRESSURE_1%>88 mmHg BLOOD PRESSURE%> / BLOOD PRESSURE%>88 mmHg                 Immunizations      No Immunizations Documented This Visit          DISCHARGE INFORMATION   Discharge Disposition: H Outpt-Sent Home   Discharge Location:  Home   Discharge Date and Time:  10/03/2019 03:56:17   ED Checkout Date and Time:  10/03/2019 03:56:17     DEPART REASON INCOMPLETE INFORMATION               Depart Action Incomplete Reason   Interactive View/I&O Recently assessed               Problems      No Problems Documented              Smoking Status      Former smoker, quit more than 30 days ago          PATIENT EDUCATION INFORMATION  Instructions:     Dental Dry Socket     Follow up:                   With: Address: When:   Follow up with primary care provider  Within 1 to 2 days   Comments:   Follow-up with your primary care doctor for reevaluation regarding your symptoms. If you have any worsening symptoms please return immediately to the ER. Please take the medications given to you as prescribed. Thank you for your visit to the emergency department today.              ED PROVIDER DOCUMENTATION

## 2019-10-03 NOTE — ED Notes (Signed)
ED Patient Education Note     ;Patient Education Materials Follows:               Free Dental Clinics       [facility]  (E.C.C.O)  [address] [place], [address]  [phone]  Evening clinic Tue and Thurs, 3:30pm              Low income residents or workers in the [employer].  First 10 patients only,   no appointments     [facility]  (also Spanish speaking service)   [address], [place], [address]  [phone]  [place] employed or residents only (uninsured)  Mon-Thurs, 8:30am-4:00pm.   Fri, 9am-11am (no dentist on site, Q&A session only),  Tue, 3:30pm-8:30pm (emergencies only, first 10 patients only)      [facility]  [address] [place] [address]  [phone]  Thurs, 5pm-9pm, no appointments, seen on order of arrival, extractions only     [facility]  [address], [place] [address] Emergency Dental Clinic room [phone]; option 1  Mon-Fri, 8:45am-1245pm, except Tue. Hours of operation depend on school semester. After hours emergencies call [phone] ask for dental resident on call.      [facility]  (also Spanish speaking service)  [employer] [facility]  [address], [place] [address]  [phone]  For children 3-27yrs only without insurance or Medicaid  Contact clinic for hours (hours of operation depend on school  semester), weekends only by appointment     [facility]  [address], [place]    [phone]   Mon 5pm-9pm.  Uninsured and low income residents of [place],  [place],  and [place] counties.     [employer], [address] [address] [phone]. Fee based care.            Dentistry     Dental Dry Socket    Dry socket is a condition that can happen after a tooth is pulled.  When a tooth gets pulled, it leaves a hole (socket). Normally, blood fills up the hole and hardens (clots). That protects the bone and nerves underneath. Dry socket happens if blood gets removed from the hole or does not fill the hole. This can cause pain because the bone and nerves are not protected.      HOME CARE     Take over-the-counter and prescription medicines only as told by your dentist or doctor.     If you were prescribed an antibiotic medicine, take it as told by your dentist or doctor. Do not stop taking it even if you start to feel better.     Brush and floss your teeth every morning and night.     Do not drink bubbly (carbonated) drinks.     Do not drink alcohol.     Do not use tobacco products. These include cigarettes, chewing tobacco, or e-cigarettes. If you need help quitting, ask your doctor.     Keep all follow-up visits as told by your dentist or doctor. This is important.    GET HELP IF:     You have pain that is not helped by medicine.     You have a fever.  Your mouth becomes tender and swollen.    GET HELP RIGHT AWAY IF:     You have very bad pain or a lot of swelling.     You have bleeding that will not stop.     You have trouble swallowing.     You cannot open your mouth.    This information is not intended to replace advice given to you by your health care provider. Make sure you discuss any questions you have with your health care provider.    Document Released: 02/18/2005 Document Revised: 01/30/2015 Document Reviewed: 10/03/2014  Elsevier Interactive Patient Education ?2016 Elsevier Inc.

## 2019-10-03 NOTE — ED Notes (Signed)
ED Triage Note       ED Triage Adult Entered On:  10/03/2019 2:56 EDT    Performed On:  10/03/2019 2:53 EDT by Lorel Monaco               Triage   Numeric Rating Pain Scale :   9   Chief Complaint :   WISDOM TOOTH EXTRACTION ON MONDAY. STATES BILATERAL LOWER JAW PAIN X 2 DAYS. UNABLE TO REACH ORAL SURGEON. UNRELIEVED WITH IBUPROFEN 800MG  AND LORTAB.    Mode of Arrival :   Walking   Infectious Disease Documentation :   Document assessment   Temperature Oral :   36.8 degC(Converted to: 98.2 degF)    Heart Rate Monitored :   102 bpm (HI)    Respiratory Rate :   16 br/min   Systolic Blood Pressure :   124 mmHg   Diastolic Blood Pressure :   88 mmHg   SpO2 :   97 %   Patient presentation :   None of the above   Chief Complaint or Presentation suggest infection :   Yes   Weight Dosing :   82 kg(Converted to: 180 lb 12 oz)    Height :   160 cm(Converted to: 5 ft 3 in)    Body Mass Index Dosing :   32 kg/m2   [place] - 10/03/2019 2:53 EDT   DCP GENERIC CODE   Tracking Acuity :   4   Tracking Group :   ED 12/03/2019 Tracking Group   [employer] - 10/03/2019 2:53 EDT   ED General Section :   Document assessment   Pregnancy Status :   Patient denies   Last Menstrual Period :   09/19/2019 EDT   ED Allergies Section :   Document assessment   ED Reason for Visit Section :   Document assessment   ED Quick Assessment :   Patient appears awake, alert, oriented to baseline. Skin warm and dry. Moves all extremities. Respiration even and unlabored. Appears in no apparent distress.   09/21/2019 - 10/03/2019 2:53 EDT   ID Risk Screen Symptoms   Close Contact with COVID-19 ID :   No   TB Symptom Screen :   No symptoms   12/03/2019 - 10/03/2019 2:53 EDT   Allergies   (As Of: 10/03/2019 02:56:18 EDT)   Allergies (Active)   doxycycline  Estimated Onset Date:   Unspecified ; Reactions:   Anaphylactic reaction ; Created By:   12/03/2019, RN, [name]; Reaction Status:   Active ; Category:   Drug ; Substance:    doxycycline ; Type:   Allergy ; Severity:   Severe ; Updated By:   [name], RN, [name]; Reviewed Date:   04/23/2019 10:26 EST      Levaquin  Estimated Onset Date:   Unspecified ; Reactions:   Anaphylactic reaction ; Created By:   04/25/2019, RN, [name]; Reaction Status:   Active ; Category:   Drug ; Substance:   Levaquin ; Type:   Allergy ; Severity:   Severe ; Updated By:   [name], RN, [name]; Reviewed Date:   04/23/2019 10:26 EST        Psycho-Social   Last 3 mo, thoughts killing self/others :   Patient denies   Right click within box for Suspected Abuse policy link. :   None   Feels Safe Where Live :   Yes  Lorel Monaco - 10/03/2019 2:53 EDT   ED Reason for Visit   (As Of: 10/03/2019 02:56:18 EDT)   Problems(Active)    Anxiety and depression (SNOMED CT  :357017793 )  Name of Problem:   Anxiety and depression ; Recorder:   Maudie Flakes, RN, Neldon Newport; Confirmation:   Confirmed ; Classification:   Patient Stated ; Code:   [id number] ; Contributor System:   PowerChart ; Last Updated:   04/20/2019 15:48 EST ; Life Cycle Date:   04/20/2019 ; Life Cycle Status:   Active ; Vocabulary:   SNOMED CT        Biliary dyskinesia (SNOMED CT  :007622633 )  Name of Problem:   Biliary dyskinesia ; Recorder:   Maudie Flakes, RN, Neldon Newport; Confirmation:   Confirmed ; Classification:   Patient Stated ; Code:   [id number] ; Contributor System:   Dietitian ; Last Updated:   04/20/2019 15:47 EST ; Life Cycle Date:   04/20/2019 ; Life Cycle Status:   Active ; Vocabulary:   SNOMED CT        Colitis (SNOMED CT  :893734287 )  Name of Problem:   Colitis ; Recorder:   [name], RN, [name]; Confirmation:   Confirmed ; Classification:   Patient Stated ; Code:   [id number] ; Contributor System:   Dietitian ; Last Updated:   12/04/2018 18:37 EDT ; Life Cycle Date:   12/04/2018 ; Life Cycle Status:   Active ; Vocabulary:   SNOMED CT        Crohn disease (SNOMED CT  :03559741 )  Name of Problem:   Crohn disease ; Recorder:   Earl Lites, RN, [name]; Confirmation:    Confirmed ; Classification:   Patient Stated ; Code:   [id number] ; Contributor System:   PowerChart ; Last Updated:   12/04/2018 17:50 EDT ; Life Cycle Date:   12/04/2018 ; Life Cycle Status:   Active ; Vocabulary:   SNOMED CT        Panic attacks (SNOMED CT  :680321224 )  Name of Problem:   Panic attacks ; Recorder:   Maudie Flakes, RN, Neldon Newport; Confirmation:   Confirmed ; Classification:   Patient Stated ; Code:   [id number] ; Contributor System:   Dietitian ; Last Updated:   04/20/2019 15:49 EST ; Life Cycle Date:   04/20/2019 ; Life Cycle Status:   Active ; Vocabulary:   SNOMED CT        PCOS (polycystic ovarian syndrome) (SNOMED CT  :888916945 )  Name of Problem:   PCOS (polycystic ovarian syndrome) ; Recorder:   Maudie Flakes, RN, Neldon Newport; Confirmation:   Confirmed ; Classification:   Patient Stated ; Code:   [id number] ; Contributor System:   Dietitian ; Last Updated:   04/20/2019 15:49 EST ; Life Cycle Date:   04/20/2019 ; Life Cycle Status:   Active ; Vocabulary:   SNOMED CT          Diagnoses(Active)    Dental pain  Date:   10/03/2019 ; Diagnosis Type:   Reason For Visit ; Confirmation:   Complaint of ; Clinical Dx:   Dental pain ; Classification:   Medical ; Clinical Service:   Emergency medicine ; Code:   PNED ; Probability:   0 ; Diagnosis Code:   [id number]

## 2019-10-03 NOTE — ED Notes (Signed)
ED Patient Education Note     Patient Education Materials Follows:  Dentistry     Dental Dry Socket    Dry socket is a condition that can occur after a tooth is  pulled (extracted). After a tooth is extracted, blood fills up the hole (socket) where the tooth had been. Normally, this blood hardens (clots). That protects the bone and nerves underneath until the gums grow over the open socket.    Dry socket occurs when a blood clot does not form or is lost for some reason. Without the blood clot, the bone and nerves are exposed to air, food, liquid, or anything else that enters the mouth. This can be very painful.      CAUSES    This condition may be caused by:     Blood not filling up the socket properly. This can occur if the extraction was difficult. Forceful pushing against the walls of the socket when the tooth is extracted can crush the walls. This keeps blood from flowing into the socket because the blood vessels have been crushed closed.     Anything that can dislodge a blood clot that is forming. For example, forceful spitting or sucking through a straw can pull a blood clot out of the socket.    RISK FACTORS    The following factors may make you more likely to develop this condition:     Having lower teeth extracted.     Smoking.     Drinking alcohol.     Being female.     Being older in age.     Having poor oral hygiene.     Gum disease or an oral bacterial infection.     Taking birth control pills.     Having your wisdom teeth extracted.    SYMPTOMS    Symptoms of this condition include:     Severe, constant pain that is dull and throbbing. The pain often begins 2?3 days after your tooth was extracted. The pain may last about one week.     Bad-smelling breath and a bad taste in your mouth.     Ear pain.     Exposed white bone in the tooth socket.     Low-grade fever.    DIAGNOSIS    This condition is diagnosed based on your symptoms and an examination of the inside of your mouth. Your dentist will check to  see if you have a blood clot in your tooth socket or if your bone is visible. You may also have imaging studies, including X-rays of your mouth and teeth. These may be done to rule out other health issues, such as a bone infection (osteomyelitis) or a problem with nearby teeth.    TREATMENT    This condition may clear up on its own after 1?2 weeks. Treatment may include antibiotic medicine. You may also get treatment for pain, which may include:     Soaking a gauze pad in a topical pain medicine then placing it inside your tooth socket. This pad (dressing) may need to be replaced every 1?3 days until your pain eases.     Taking NSAIDs such as ibuprofen.    HOME CARE INSTRUCTIONS     Take over-the-counter and prescription medicines only as told by your health care provider or dentist.     If you were prescribed an antibiotic medicine, take it as told by your health care provider or dentist. Do not stop taking the antibiotic even if  you start to feel better.     Brush and floss your teeth every morning and night. Good oral hygiene is important.     Do not drink carbonated beverages or alcohol.     Do not use tobacco products, including cigarettes, chewing tobacco, or e-cigarettes. If you need help quitting, ask your health care provider.     Keep all follow-up visits as told by your dentist. This is important.    SEEK MEDICAL CARE IF:     You have pain that is not helped by medicine.     You have a fever.     Your mouth becomes tender and swollen. This could be a sign of infection.    SEEK IMMEDIATE MEDICAL CARE IF:     You have uncontrolled bleeding, very noticeable swelling, or severe pain.     You have difficulty swallowing.     You cannot open your mouth.    This information is not intended to replace advice given to you by your health care provider. Make sure you discuss any questions you have with your health care provider.    Document Released: 08/25/2002 Document Revised: 01/30/2015 Document Reviewed:  10/03/2014  Elsevier Interactive Patient Education ?2016 Elsevier Inc.

## 2019-10-03 NOTE — ED Notes (Signed)
 ED Patient Summary       ;       [facility]  [address] [place], [place] [address]  [phone]  Discharge Instructions (Patient)  _______________________________________     Name: [name] [name], [name] [name]  DOB: February 01, 1986                   MRN: [id number]                   FIN: [phone]  Reason For Visit: Mouth pain; TOOTH PAIN  Final Diagnosis: Dry tooth socket     Visit Date: 10/03/2019 13:26:00  Address: [address] [place] [address]  Phone: [phone]     Emergency Department Providers:        Primary Physician:   OLEGARIO DONNICE CINDERELLA [facility] would like to thank you for allowing us  to assist you with your healthcare needs. The following includes patient education materials and information regarding your injury/illness.     Follow-up Instructions:  You were seen today on an emergency basis. Please contact your primary care doctor for a follow up appointment. If you received a referral to a specialist doctor, it is important you follow-up as instructed.    It is important that you call your follow-up doctor to schedule and confirm the location of your next appointment. Your doctor may practice at multiple locations. The office location of your follow-up appointment may be different to the one written on your discharge instructions.    If you do not have a primary care doctor, please call [phone] for help in finding a [facility] Provider. For help in finding a specialist doctor, please call [phone].    If your condition gets worse before your follow-up with your primary care doctor or specialist, please return to the Emergency Department.      Coronavirus 2019 (COVID-19) Reminders:     Patients age 56 - 76, with parental consent, and patients over age 77 can make an appointment for a COVID-19 vaccine. Patients can contact their [facility] doctors' offices to schedule an appointment to receive  the COVID-19 vaccine. Patients who do not have a [facility] physician can call [phone] to schedule vaccination appointments.      Follow Up Appointments:  Primary Care Provider:      Name: [name] [name]      Phone: [phone]                 With: Address: When:   YOUR DENTIST  Within 1 to 2 days   Comments:   Please follow-up with your dentist tomorrow.     We put a list of free dental clinics to call as well if you cannot schedule one tomorrow.     Return for any worsening or new concerning symptoms.       With: Address: When:   primary care physician or clinic  Within 2 to 4 days   Comments:   Please follow up with your primary care doctor as soon as possible.     If you do not have a primary care doctor, please call [facility] Referral line at [phone]     Please return for any worsening or new concerning symptoms.              Printed Prescriptions:    Patient  Education Materials:  Discharge Orders          Discharge Patient 10/03/19 14:23:00 EDT         Comment:      Dental Dry Socket, Easy-to-Read; Free Dental Clinics WINDOW 10 (CUSTOM)     Dental Dry Socket    Dry socket is a condition that can happen after a tooth is pulled. When a tooth gets pulled, it leaves a hole (socket). Normally, blood fills up the hole and hardens (clots). That protects the bone and nerves underneath. Dry socket happens if blood gets removed from the hole or does not fill the hole. This can cause pain because the bone and nerves are not protected.      HOME CARE     Take over-the-counter and prescription medicines only as told by your dentist or doctor.     If you were prescribed an antibiotic medicine, take it as told by your dentist or doctor. Do not stop taking it even if you start to feel better.     Brush and floss your teeth every morning and night.     Do not drink bubbly (carbonated) drinks.     Do not drink alcohol.     Do not use tobacco products. These include cigarettes, chewing tobacco,  or e-cigarettes. If you need help quitting, ask your doctor.     Keep all follow-up visits as told by your dentist or doctor. This is important.    GET HELP IF:     You have pain that is not helped by medicine.     You have a fever.     Your mouth becomes tender and swollen.    GET HELP RIGHT AWAY IF:     You have very bad pain or a lot of swelling.     You have bleeding that will not stop.     You have trouble swallowing.     You cannot open your mouth.    This information is not intended to replace advice given to you by your health care provider. Make sure you discuss any questions you have with your health care provider.    Document Released: 02/18/2005 Document Revised: 01/30/2015 Document Reviewed: 10/03/2014  Elsevier Interactive Patient Education ?2016 Elsevier Inc.                 Free Dental Clinics       [facility]  (E.C.C.O)  [address] [place], [address]  [phone]  Evening clinic Tue and Thurs, 3:30pm              Low income residents or workers in the [employer].  First 10 patients only,   no appointments     [facility]  (also Spanish speaking service)   [address] [place], [address]  [phone]  [place] employed or residents only (uninsured)  Mon-Thurs, 8:30am-4:00pm.   Fri, 9am-11am (no dentist on site, Q&A session only),  Tue, 3:30pm-8:30pm (emergencies only, first 10 patients only)      [facility]  [address], [place] [address]  [phone]  Thurs, 5pm-9pm, no appointments, seen on order of arrival, extractions only     [facility]  [address] [place] [address] Emergency Dental Clinic room [phone]; option 1  Mon-Fri, 8:45am-1245pm, except Tue. Hours of operation depend on  school semester. After hours emergencies call [phone] ask for dental resident on call.      [facility]   (also Spanish speaking service)  [employer] [facility]  [address], [place] [address]  [phone]  For children 3-53yrs only without insurance or Medicaid  Contact clinic for hours (hours of operation depend on school  semester), weekends only by appointment     [facility]  [address], [place]    [phone]   Mon 5pm-9pm.  Uninsured and low income residents of [place],  [place],  and [place] counties.     [employer], [address] [address] [phone]. Fee based care.               Allergy Info: Levaquin; doxycycline     Medication Information:  [facility] provided you with a complete list of medications post discharge, if you have been instructed to stop taking a medication please ensure you also follow up with this information to your Primary Care Physician.  Unless otherwise noted, patient will continue to take medications as prescribed prior to the Emergency Room visit.  Any specific questions regarding your chronic medications and dosages should be discussed with your physician(s) and pharmacist.          amoxicillin -clavulanate (Augmentin 875 mg-125 mg oral tablet) 1 Tabs Oral (given by mouth) 2 times a day for 10 Days. Refills: 0.  cholecalciferol (Vitamin D3) 1 Capsules Oral (given by mouth) every day.  DULoxetine (DULoxetine 60 mg oral delayed release capsule) 1 Capsules Oral (given by mouth) once a day (in the morning).  ferrous sulfate (ferrous sulfate 325 mg (65 mg elemental iron) oral delayed release tablet) 1 Tabs Oral (given by mouth) every day., DO NOT CRUSH  HYDROcodone -acetaminophen  (Norco 325 mg-5 mg oral tablet) 1 Tabs Oral (given by mouth) every 6 hours as needed moderate pain (4-7) for 3 Days. Refills: 0.  lidocaine  topical (Lidocaine  Viscous 2% mucous membrane solution) 5 Milliliter Topical (on the skin) four times a day (before  meals and at bedtime) as needed mouth sore pain for 7 Days. Refills: 0.  mesalamine (mesalamine 0.375 g oral capsule, extended release) 4 Capsules Oral (given by mouth) once a day (in the morning).  metoclopramide (Reglan 10 mg oral tablet) 1 Tabs Oral (given by mouth) 3 times a day.  multivitamin with minerals (Multi-Day Plus Minerals oral tablet) 1 Tabs Oral (given by mouth) every day.  ondansetron  (Zofran  4 mg oral tablet) 1 Tabs Oral (given by mouth) every 8 hours as needed as needed for nausea/vomiting. Refills: 0.      Medications Administered During Visit:              Medication Dose Route   bupivacaine  1 mL ID          Major Tests and Procedures:  The following procedures and tests were performed during your Emergency Room visit.  COMMON PROCEDURES%>  COMMON PROCEDURES COMMENTS%>          Laboratory Orders  No laboratory orders were placed.              Radiology Orders  No radiology orders were placed.              Patient Care Orders  Name Status Details   Discharge Patient Ordered 10/03/19 14:23:00 EDT   ED 72 hr  Return Ordered 10/03/19 13:26:09 EDT, This message can only be seen by Nursing, it is not visible to Pharmacy, Laboratory, or Radiology., 10/03/19 13:26:09 EDT   ED Assessment Adult Completed 10/03/19 13:50:01 EDT, 10/03/19 13:50:01 EDT   ED Secondary Triage Completed 10/03/19 13:50:01 EDT, 10/03/19 13:50:01 EDT   ED Triage Adult Completed 10/03/19 13:26:09 EDT, 10/03/19 13:26:09 EDT       ---------------------------------------------------------------------------------------------------------------------  [facility]) encourages you to self-enroll in the [facility] Patient Portal.  [facility] Patient Portal will allow you to manage your personal health information securely from your own electronic device now and in the future.  To begin your Patient Portal enrollment process, please visit [email or web address] Click on "Sign up now" under [facility].  If you find that you need additional assistance on the [facility] Patient Portal or need a copy of your medical records, please call the [facility] Medical Records Office at [phone].  Comment:

## 2019-11-12 LAB — COMPREHENSIVE METABOLIC PANEL
ALT: 16 U/L (ref 0–33)
AST: 15 U/L (ref 0–32)
Albumin/Globulin Ratio: 1.89 mmol/L (ref 1.00–2.70)
Albumin: 4.5 g/dL (ref 3.5–5.2)
Alk Phosphatase: 67 U/L (ref 35–117)
Anion Gap: 10 mmol/L (ref 2–17)
BUN: 8 mg/dL (ref 6–20)
CO2: 27 mmol/L (ref 22–29)
Calcium: 9.4 mg/dL (ref 8.6–10.0)
Chloride: 102 mmol/L (ref 98–107)
Creatinine: 1 mg/dL (ref 0.5–1.0)
GFR African American: 86 mL/min/{1.73_m2} — ABNORMAL LOW (ref >=90)
GFR Non-African American: 74 mL/min/{1.73_m2} — ABNORMAL LOW (ref >=90)
Globulin: 2.4 g/dL (ref 1.9–4.4)
Glucose: 110 mg/dL — ABNORMAL HIGH (ref 70–99)
OSMOLALITY CALCULATED: 275 mOsm/kg (ref 270–287)
Potassium: 4 mmol/L (ref 3.5–5.3)
Sodium: 138 mmol/L (ref 135–145)
Total Bilirubin: 0.44 mg/dL (ref 0.00–1.20)
Total Protein: 6.9 g/dL (ref 6.4–8.3)

## 2019-11-12 LAB — CBC
Hematocrit: 43.3 % (ref 34.0–47.0)
Hemoglobin: 14.3 g/dL (ref 11.5–15.7)
MCH: 31.7 pg (ref 27.0–34.5)
MCHC: 33 g/dL (ref 32.0–36.0)
MCV: 96 fL (ref 81.0–99.0)
MPV: 9.5 fL (ref 7.2–13.2)
NRBC Absolute: 0 10*3/uL (ref 0.000–0.012)
NRBC Automated: 0 % (ref 0.0–0.2)
Platelets: 328 10*3/uL (ref 140–440)
RBC: 4.51 x10e6/mcL (ref 3.60–5.20)
RDW: 12.1 % (ref 11.0–16.0)
WBC: 7.9 10*3/uL (ref 3.8–10.6)

## 2019-11-12 LAB — SEDIMENTATION RATE: Sed Rate: <1 mm/hr (ref 0–20)

## 2019-11-12 LAB — TSH WITH REFLEX TO FT4: TSH: 0.837 mcIU/mL (ref 0.358–3.740)

## 2019-12-05 LAB — POC COVID-19 & INFLUENZA COMBO (LIAT IN HOUSE)
INFLUENZA A: NOT DETECTED
INFLUENZA B: NOT DETECTED
SARS-CoV-2: NOT DETECTED

## 2020-03-19 LAB — POC URINALYSIS, CHEMISTRY
Bilirubin, Urine, POC: NEGATIVE
Glucose, UA POC: NEGATIVE mg/dL
Ketones, Urine, POC: NEGATIVE mg/dL
Nitrate, UA POC: NEGATIVE
Protein, Urine, POC: NEGATIVE
Specific Gravity, Urine, POC: 1.015 (ref 1.003–1.035)
UROBILIN U POC: 0.2 EU/dL
pH, Urine, POC: 5.5 (ref 4.5–8.0)

## 2020-03-19 LAB — BASIC METABOLIC PANEL
Anion Gap: 11 mmol/L (ref 2–17)
BUN: 8 mg/dL (ref 6–20)
CO2: 23 mmol/L (ref 22–29)
Calcium: 9.1 mg/dL (ref 8.6–10.0)
Chloride: 104 mmol/L (ref 98–107)
Creatinine: 0.7 mg/dL (ref 0.5–1.0)
GFR African American: 133 mL/min/{1.73_m2} (ref >=90)
GFR Non-African American: 115 mL/min/{1.73_m2} (ref >=90)
Glucose: 97 mg/dL (ref 70–99)
OSMOLALITY CALCULATED: 274 mOsm/kg (ref 270–287)
Potassium: 3.8 mmol/L (ref 3.5–5.3)
Sodium: 138 mmol/L (ref 135–145)

## 2020-03-19 LAB — CBC WITH AUTO DIFFERENTIAL
Absolute Baso #: 0.1 10*3/uL (ref 0.0–0.2)
Absolute Eos #: 0.1 10*3/uL (ref 0.0–0.5)
Absolute Lymph #: 2.2 10*3/uL (ref 1.0–3.2)
Absolute Mono #: 0.7 10*3/uL (ref 0.3–1.0)
Basophils %: 0.8 % (ref 0.0–2.0)
Eosinophils %: 0.8 % (ref 0.0–7.0)
Hematocrit: 36 % (ref 34.0–47.0)
Hemoglobin: 12.4 g/dL (ref 11.5–15.7)
Immature Grans (Abs): 0.01 10*3/uL (ref 0.00–0.06)
Immature Granulocytes: 0.2 % (ref 0.1–0.6)
Lymphocytes: 36.2 % (ref 15.0–45.0)
MCH: 32 pg (ref 27.0–34.5)
MCHC: 34.4 g/dL (ref 32.0–36.0)
MCV: 93 fL (ref 81.0–99.0)
MPV: 9.5 fL (ref 7.2–13.2)
Monocytes: 11.6 % (ref 4.0–12.0)
Neutrophils %: 50.4 % (ref 42.0–74.0)
Neutrophils Absolute: 3.1 10*3/uL (ref 1.6–7.3)
Platelets: 344 10*3/uL (ref 140–440)
RBC: 3.87 x10e6/mcL (ref 3.60–5.20)
RDW: 12 % (ref 11.0–16.0)
WBC: 6.1 10*3/uL (ref 3.8–10.6)

## 2020-03-19 LAB — ABO/RH: ABO/Rh: B NEG

## 2020-03-19 LAB — HCG, QUANTITATIVE, PREGNANCY: hCG Quant: <0.1 m[IU]/mL (ref 0.0–5.3)

## 2020-03-19 LAB — POC PREGNANCY UR-QUAL: Preg Test, Ur: NEGATIVE

## 2020-03-19 NOTE — ED Notes (Signed)
ED Triage Note       ED Secondary Triage Entered On:  03/19/2020 6:21 EST    Performed On:  03/19/2020 6:20 EST by Pernell Dupre, RN, Palmo T               General Information   Barriers to Learning :   None evident   Pt. Currently Receiving Radiation :   No   COVID-19 Vaccine Status :   Not received   ED Home Meds Section :   Document assessment   [facility] ED Fall Risk Section :   Document assessment   ED Advance Directives Section :   Document assessment   ED Palliative Screen :   N/A (prefilled for <65yo)   Pernell Dupre, RN, Palmo T - 03/19/2020 6:20 EST   (As Of: 03/19/2020 06:21:39 EST)   Problems(Active)    Anxiety and depression (SNOMED CT  :784696295 )  Name of Problem:   Anxiety and depression ; Recorder:   Maudie Flakes, RN, Neldon Newport; Confirmation:   Confirmed ; Classification:   Patient Stated ; Code:   [id number] ; Contributor System:   PowerChart ; Last Updated:   04/20/2019 15:48 EST ; Life Cycle Date:   04/20/2019 ; Life Cycle Status:   Active ; Vocabulary:   SNOMED CT        Biliary dyskinesia (SNOMED CT  :102725366 )  Name of Problem:   Biliary dyskinesia ; Recorder:   Maudie Flakes, RN, Neldon Newport; Confirmation:   Confirmed ; Classification:   Patient Stated ; Code:   [id number] ; Contributor System:   Dietitian ; Last Updated:   04/20/2019 15:47 EST ; Life Cycle Date:   04/20/2019 ; Life Cycle Status:   Active ; Vocabulary:   SNOMED CT        Colitis (SNOMED CT  :956387564 )  Name of Problem:   Colitis ; Recorder:   [name], RN, [name]; Confirmation:   Confirmed ; Classification:   Patient Stated ; Code:   [id number] ; Contributor System:   Dietitian ; Last Updated:   12/04/2018 18:37 EDT ; Life Cycle Date:   12/04/2018 ; Life Cycle Status:   Active ; Vocabulary:   SNOMED CT        Crohn disease (SNOMED CT  :16606301 )  Name of Problem:   Crohn disease ; Recorder:   Earl Lites, RN, [name]; Confirmation:   Confirmed ; Classification:   Patient Stated ; Code:   [id number] ; Contributor System:   PowerChart ; Last Updated:   12/04/2018 17:50 EDT ;  Life Cycle Date:   12/04/2018 ; Life Cycle Status:   Active ; Vocabulary:   SNOMED CT        Panic attacks (SNOMED CT  :557322025 )  Name of Problem:   Panic attacks ; Recorder:   Maudie Flakes, RN, Neldon Newport; Confirmation:   Confirmed ; Classification:   Patient Stated ; Code:   [id number] ; Contributor System:   Dietitian ; Last Updated:   04/20/2019 15:49 EST ; Life Cycle Date:   04/20/2019 ; Life Cycle Status:   Active ; Vocabulary:   SNOMED CT        PCOS (polycystic ovarian syndrome) (SNOMED CT  :283151761 )  Name of Problem:   PCOS (polycystic ovarian syndrome) ; Recorder:   Maudie Flakes, RN, Neldon Newport; Confirmation:   Confirmed ; Classification:   Patient Stated ; Code:   [id number] ; Contributor System:   Dietitian ; Last Updated:   04/20/2019 15:49 EST ; Life  Cycle Date:   04/20/2019 ; Life Cycle Status:   Active ; Vocabulary:   SNOMED CT          Diagnoses(Active)    Threatened miscarriage  Date:   03/19/2020 ; Diagnosis Type:   Discharge ; Confirmation:   Confirmed ; Clinical Dx:   Threatened miscarriage ; Classification:   Medical ; Clinical Service:   Non-Specified ; Code:   ICD-10-CM ; Probability:   0 ; Diagnosis Code:   O20.0      Vaginal bleeding - < [redacted] wks pregnant  Date:   03/19/2020 ; Diagnosis Type:   Reason For Visit ; Confirmation:   Complaint of ; Clinical Dx:   Vaginal bleeding - < [redacted] wks pregnant ; Classification:   Medical ; Clinical Service:   Emergency medicine ; Code:   PNED ; Probability:   0 ; Diagnosis Code:   [id number]             -    Procedure History   (As Of: 03/19/2020 06:21:39 EST)     Anesthesia Minutes:   0 ; Procedure Name:   Colonoscopy ; Procedure Minutes:   0            Procedure Dt/Tm:   04/23/2019 12:29:00 EST ; Location:   [facility] OR ; Provider:   Durel Salts; Anesthesia Type:   General ; :   HUMSI-MD,  MICHAEL K; Anesthesia Minutes:   0 ; Procedure Name:   Cholecystectomy Laparoscopic ; Procedure Minutes:   21 ; Comments:     04/23/2019 13:15 EST - Bethann Goo, RN,  Cathleen L  auto-populated from documented surgical case ; Clinical Service:   Surgery            Anesthesia Minutes:   0 ; Procedure Name:   Tonsillectomy ; Procedure Minutes:   0            Anesthesia Minutes:   0 ; Procedure Name:   CERVICAL LYMPHADENECTOMY ; Procedure Minutes:   0 ; Comments:     04/20/2019 15:45 EST - Maudie Flakes, RN, Neldon Newport  BENIGN            Anesthesia Minutes:   0 ; Procedure Name:   D &C ; Procedure Minutes:   0            Anesthesia Minutes:   0 ; Procedure Name:   Cesarean section ; Procedure Minutes:   0            Anesthesia Minutes:   0 ; Procedure Name:   EGD - Esophagogastroduodenoscopy ; Procedure Minutes:   0            [facility] Fall Risk Assessment Tool   Hx of falling last 3 months ED Fall :   No   Patient confused or disoriented ED Fall :   No   Patient intoxicated or sedated ED Fall :   No   Patient impaired gait ED Fall :   No   Use a mobility assistance device ED Fall :   No   Patient altered elimination ED Fall :   No   [facility] ED Fall Score :   0    Adams, RN, Palmo T - 03/19/2020 6:20 EST   ED Advance Directive   Advance Directive :   No   Adams, RN, Palmo T - 03/19/2020 6:20 EST

## 2020-03-19 NOTE — ED Notes (Signed)
ED Triage Note       ED Triage Adult Entered On:  03/19/2020 5:53 EST    Performed On:  03/19/2020 5:44 EST by [name], RN, Palmo T               Triage   Numeric Rating Pain Scale :   8   Chief Complaint :   c/o lower abd cramping and vaginal bledding that started 2 hrs ago. pt reports passing clots. lmp 01/09/2020. g4p1. pt has appt with dr. [name], but has seen her yet.    [place] Mode of Arrival :   Private vehicle   Infectious Disease Documentation :   Document assessment   Patient received chemo or biotherapy last 48 hrs? :   No   Temperature Oral :   36.8 degC(Converted to: 98.2 degF)    Heart Rate Monitored :   100 bpm   Respiratory Rate :   18 br/min   Systolic Blood Pressure :   122 mmHg   Diastolic Blood Pressure :   83 mmHg   SpO2 :   98 %   Oxygen Therapy :   Room air   Patient presentation :   None of the above   Chief Complaint or Presentation suggest infection :   No   Dosing Weight Obtained By :   Measured   Weight Dosing :   66.7 kg(Converted to: 147 lb 1 oz)    Height :   159 cm(Converted to: 5 ft 3 in)    Body Mass Index Dosing :   26 kg/m2   [name], RN, Palmo T - 03/19/2020 5:44 EST   DCP GENERIC CODE   Tracking Acuity :   3   Tracking Group :   ED [facility] [employer]   Adams, RN, [place] - 03/19/2020 5:44 EST   ED General Section :   Document assessment   Pregnancy Status :   Confirmed Pregnancy   Last Menstrual Period :   01/09/2020 EDT   ED Allergies Section :   Document assessment   ED Reason for Visit Section :   Document assessment   ED Home Meds Section :   Document assessment   Adams, RN, Palmo T - 03/19/2020 5:44 EST   ID Risk Screen Symptoms   Recent Travel History :   No recent travel   Close Contact with COVID-19 ID :   No   Last 14 days COVID-19 ID :   Yes - Not Detected (negative)   TB Symptom Screen :   No symptoms   C. diff Symptom/History ID :   Neither of the above   Adams, RN, Palmo T - 03/19/2020 5:44 EST   Allergies   (As Of: 03/19/2020 05:53:07 EST)   Allergies (Active)    doxycycline  Estimated Onset Date:   Unspecified ; Reactions:   Anaphylactic reaction ; Created By:   Jorge MandrilBazzle, RN, Krystle L; Reaction Status:   Active ; Category:   Drug ; Substance:   doxycycline ; Type:   Allergy ; Severity:   Severe ; Updated By:   Jorge MandrilBazzle, RN, Vista LawmanKrystle L; Reviewed Date:   03/19/2020 5:49 EST      Levaquin  Estimated Onset Date:   Unspecified ; Reactions:   Anaphylactic reaction ; Created By:   Jorge MandrilBazzle, RN, Vista LawmanKrystle L; Reaction Status:   Active ; Category:   Drug ; Substance:   Levaquin ; Type:   Allergy ; Severity:   Severe ;  Updated By:   [name], RN, [name]; Reviewed Date:   03/19/2020 5:49 EST        Psycho-Social   Last 3 mo, thoughts killing self/others :   Patient denies   Right click within box for Suspected Abuse policy link. :   None   Feels Safe Where Live :   Yes   Adams, RN, Palmo T - 03/19/2020 5:44 EST   ED Home Med List   Medication List   (As Of: 03/19/2020 05:53:07 EST)   Prescription/Discharge Order    ondansetron  :   ondansetron ; Status:   Completed ; Ordered As Mnemonic:   Zofran 4 mg oral tablet ; Simple Display Line:   4 mg, 1 tabs, Oral, q8hr, PRN: as needed for nausea/vomiting, 10 tabs, 0 Refill(s) ; Ordering Provider:   Oneida Arenas; Catalog Code:   ondansetron ; Order Dt/Tm:   04/23/2019 13:51:14 EST            Home Meds    dicyclomine  :   dicyclomine ; Status:   Documented ; Ordered As Mnemonic:   dicyclomine ; Simple Display Line:   mg, 0 Refill(s) ; Catalog Code:   dicyclomine ; Order Dt/Tm:   03/19/2020 05:52:35 EST          ondansetron  :   ondansetron ; Status:   Documented ; Ordered As Mnemonic:   ondansetron 8 mg oral tablet ; Simple Display Line:   0 Refill(s) ; Catalog Code:   ondansetron ; Order Dt/Tm:   03/19/2020 05:52:41 EST          clonazePAM  :   clonazePAM ; Status:   Documented ; Ordered As Mnemonic:   clonazePAM 1 mg oral tablet ; Simple Display Line:   0 Refill(s) ; Catalog Code:   clonazePAM ; Order Dt/Tm:   03/19/2020 05:52:41 EST           FLUoxetine  :   FLUoxetine ; Status:   Documented ; Ordered As Mnemonic:   FLUoxetine 40 mg oral capsule ; Simple Display Line:   40 mg, 1 caps, Oral, Daily, 30 caps, 0 Refill(s) ; Catalog Code:   FLUoxetine ; Order Dt/Tm:   03/19/2020 05:52:41 EST ; Comment:   SOUND ALIKE / LOOK ALIKE - VERIFY DRUG          ferrous sulfate  :   ferrous sulfate ; Status:   Completed ; Ordered As Mnemonic:   ferrous sulfate 325 mg (65 mg elemental iron) oral delayed release tablet ; Simple Display Line:   325 mg, 1 tabs, Oral, Daily, 0 Refill(s) ; Catalog Code:   ferrous sulfate ; Order Dt/Tm:   04/20/2019 15:52:25 EST ; Comment:   DO NOT CRUSH          metoclopramide  :   metoclopramide ; Status:   Completed ; Ordered As Mnemonic:   Reglan 10 mg oral tablet ; Simple Display Line:   10 mg, 1 tabs, Oral, TID, 0 Refill(s) ; Catalog Code:   metoclopramide ; Order Dt/Tm:   04/20/2019 15:52:57 EST          cholecalciferol  :   cholecalciferol ; Status:   Completed ; Ordered As Mnemonic:   Vitamin D3 ; Simple Display Line:   1 caps, Oral, Daily, 0 Refill(s) ; Catalog Code:   cholecalciferol ; Order Dt/Tm:   04/20/2019 15:51:54 EST          DULoxetine  :   DULoxetine ;  Status:   Completed ; Ordered As Mnemonic:   DULoxetine 60 mg oral delayed release capsule ; Simple Display Line:   60 mg, 1 caps, Oral, qAM, 0 Refill(s) ; Catalog Code:   DULoxetine ; Order Dt/Tm:   04/20/2019 15:53:31 EST          mesalamine  :   mesalamine ; Status:   Completed ; Ordered As Mnemonic:   mesalamine 0.375 g oral capsule, extended release ; Simple Display Line:   1.5 g, 4 cap, Oral, qAM, 0 Refill(s) ; Catalog Code:   mesalamine ; Order Dt/Tm:   04/20/2019 15:53:31 EST          multivitamin with minerals  :   multivitamin with minerals ; Status:   Completed ; Ordered As Mnemonic:   Multi-Day Plus Minerals oral tablet ; Simple Display Line:   1 tabs, Oral, Daily, 0 Refill(s) ; Catalog Code:   multivitamin with minerals ; Order Dt/Tm:   04/20/2019 15:51:33 EST             ED Reason for Visit   (As Of: 03/19/2020 05:53:07 EST)   Problems(Active)    Anxiety and depression (SNOMED CT  :756433295 )  Name of Problem:   Anxiety and depression ; Recorder:   Maudie Flakes, RN, Neldon Newport; Confirmation:   Confirmed ; Classification:   Patient Stated ; Code:   [id number] ; Contributor System:   PowerChart ; Last Updated:   04/20/2019 15:48 EST ; Life Cycle Date:   04/20/2019 ; Life Cycle Status:   Active ; Vocabulary:   SNOMED CT        Biliary dyskinesia (SNOMED CT  :301601093 )  Name of Problem:   Biliary dyskinesia ; Recorder:   Maudie Flakes, RN, Neldon Newport; Confirmation:   Confirmed ; Classification:   Patient Stated ; Code:   [id number] ; Contributor System:   Dietitian ; Last Updated:   04/20/2019 15:47 EST ; Life Cycle Date:   04/20/2019 ; Life Cycle Status:   Active ; Vocabulary:   SNOMED CT        Colitis (SNOMED CT  :254270623 )  Name of Problem:   Colitis ; Recorder:   [name], RN, [name]; Confirmation:   Confirmed ; Classification:   Patient Stated ; Code:   [id number] ; Contributor System:   Dietitian ; Last Updated:   12/04/2018 18:37 EDT ; Life Cycle Date:   12/04/2018 ; Life Cycle Status:   Active ; Vocabulary:   SNOMED CT        Crohn disease (SNOMED CT  :61607371 )  Name of Problem:   Crohn disease ; Recorder:   Earl Lites, RN, [name]; Confirmation:   Confirmed ; Classification:   Patient Stated ; Code:   06269485 ; Contributor System:   PowerChart ; Last Updated:   12/04/2018 17:50 EDT ; Life Cycle Date:   12/04/2018 ; Life Cycle Status:   Active ; Vocabulary:   SNOMED CT        Panic attacks (SNOMED CT  :462703500 )  Name of Problem:   Panic attacks ; Recorder:   Maudie Flakes, RN, Neldon Newport; Confirmation:   Confirmed ; Classification:   Patient Stated ; Code:   [id number] ; Contributor System:   Dietitian ; Last Updated:   04/20/2019 15:49 EST ; Life Cycle Date:   04/20/2019 ; Life Cycle Status:   Active ; Vocabulary:   SNOMED CT        PCOS (polycystic ovarian syndrome) (SNOMED CT  :716967893 )  Name of Problem:    PCOS (polycystic ovarian syndrome) ; Recorder:   Maudie Flakes, RN, Neldon Newport; Confirmation:   Confirmed ; Classification:   Patient Stated ; Code:   [id number] ; Contributor System:   Dietitian ; Last Updated:   04/20/2019 15:49 EST ; Life Cycle Date:   04/20/2019 ; Life Cycle Status:   Active ; Vocabulary:   SNOMED CT          Diagnoses(Active)    Vaginal bleeding - < [redacted] wks pregnant  Date:   03/19/2020 ; Diagnosis Type:   Reason For Visit ; Confirmation:   Complaint of ; Clinical Dx:   Vaginal bleeding - < [redacted] wks pregnant ; Classification:   Medical ; Clinical Service:   Emergency medicine ; Code:   PNED ; Probability:   0 ; Diagnosis Code:   [id number]

## 2020-03-19 NOTE — ED Provider Notes (Signed)
General Medical Problem *ED        Patient:   [name] [name], [name] [name]             MRN: [id number]            FIN: [id number]               Age:   35 years     Sex:  Female     DOB:  1985-09-28   Associated Diagnoses:   Threatened miscarriage   Author:   Loleta Books      Basic Information   Time seen: Provider Seen (ST)   ED Provider/Time:    Cevin Rubinstein,  Mandalyn Pasqua-DO / 03/19/2020 06:01  .   Additional information: Chief Complaint from Nursing Triage Note   Chief Complaint  Chief Complaint: c/o lower abd cramping and vaginal bledding that started 2 hrs ago. pt reports passing clots. lmp 01/09/2020. g4p1. pt has appt with dr. Cherylin Mylar, but has seen her yet. (03/19/20 05:44:00).      History of Present Illness   35 year old female G91, P1 with a history of generalized anxiety, Crohn's disease presenting out of concern for possible miscarriage has had intermittent-like vaginal bleeding since her pregnancy test that was taken early November.  She reportedly is 5 weeks but has yet to see an OB with a confirmed confirmation of her pregnancy.  She notes slightly improved clotting and bleeding this morning but having some lower abdominal crampiness.  Otherwise has no fever chills nausea vomiting or diarrhea.  No further complaints or concerns she reportedly is B-.  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  No further alleviating or provoking complaints not worsening.      Review of Systems   Constitutional symptoms:  No fever, no chills, no sweats, no generalized weakness, no fatigue, no decreased activity.    Skin symptoms:  No jaundice, no rash.    Eye symptoms:  Negative except as documented in HPI.   ENMT symptoms:  Negative except as documented in HPI.   Respiratory symptoms:  Negative except as documented in HPI.   Cardiovascular symptoms:  No chest pain, no palpitations, no tachycardia, no syncope, no diaphoresis.    Gastrointestinal symptoms:  No abdominal pain, no nausea, no vomiting,  no diarrhea.    Genitourinary symptoms:  Vaginal bleeding, no hematuria, no vaginal discharge.    Musculoskeletal symptoms:  No back pain, no Muscle pain, no Joint pain.    Neurologic symptoms:  No headache, no dizziness, no altered level of consciousness, no numbness, no tingling, no focal weakness.    Psychiatric symptoms:  No anxiety,              Additional review of systems information: All other systems reviewed and otherwise negative.      Health Status   Allergies:    Allergic Reactions (Selected)  Severe  Doxycycline- Anaphylactic reaction.  Levaquin- Anaphylactic reaction..      Past Medical/ Family/ Social History   Medical history: Reviewed as documented in chart.   Surgical history: Reviewed as documented in chart.   Family history: Not significant.   Social history: Reviewed as documented in chart.   Problem list:    Active Problems (6)  Anxiety and depression   Biliary dyskinesia   Colitis   Crohn disease   Panic attacks   PCOS (polycystic ovarian syndrome)   .      Physical Examination  Vital Signs   Vital Signs   03/19/2020 5:44 EST Systolic Blood Pressure 122 mmHg    Diastolic Blood Pressure 83 mmHg    Temperature Oral 36.8 degC    Heart Rate Monitored 100 bpm    Respiratory Rate 18 br/min    SpO2 98 %   .   Measurements   03/19/2020 5:53 EST Body Mass Index est meas 26.38 kg/m2    Body Mass Index Measured 26.38 kg/m2   03/19/2020 5:44 EST Height/Length Measured 159 cm    Weight Dosing 66.7 kg   .   Basic Oxygen Information   03/19/2020 5:44 EST Oxygen Therapy Room air    SpO2 98 %   .   General:  Alert, no acute distress.    Glasgow coma scale:  Total score: Total score: 15.   Neurological:  Alert and oriented to person, place, time, and situation.   Skin:  Warm, intact.    Head:  Normocephalic, atraumatic.    Neck:  Supple, trachea midline.    Eye:  Normal conjunctiva.   Ears, nose, mouth and throat:  Oral mucosa moist.   Cardiovascular   Respiratory:  Respirations are non-labored.    Chest wall:  No tenderness.   Back:  Nontender, Normal range of motion.    Musculoskeletal:  Normal ROM.   Gastrointestinal:  Soft.   Genitourinary:  minimal bleeding  .   Psychiatric:  Cooperative.      Reexamination/ Reevaluation   Course: improving.   Pain status: decreased.   Assessment: Spoke with Dr. Campbell Lerner regarding patient's ultrasound findings and concerns for possible retained products versus a small endometrial polyp.  Patient has [place] no complaints and symptoms of Dr. Delena Bali agrees with outpatient follow-up in the morning no need for emergent D&C.  Patient has no fevers or chills or signs of endometritis or further infectious-like uterine processes.  She has had a normal lab work-up and she has no signs of urinary infection.  We will continue outpatient pain meds as prescribed return for acute change or worsening without fail..      Impression and Plan   Diagnosis   Threatened miscarriage (ICD10-CM O20.0, Discharge, Medical)   Plan   Condition: Improved, Stable.    Disposition: Discharged: to home.    Patient was given the following educational materials: Understanding Miscarriage: Emotions.    Follow up with: Carita Pian In 1 day Return to ED if symptoms worsen.    Counseled: Patient, I had a detailed discussion with the patient and/or guardian regarding the historical points/exam findings supporting the discharge diagnosis and need for outpatient followup. Discussed the need to return to the ER if symptoms persist/worsen, or for any questions/concerns that arise at home.    Signature Line     Electronically Signed on 03/19/2020 06:17 AM EST   ________________________________________________   Loleta Books      Electronically Signed on 03/19/2020 07:29 AM EST   ________________________________________________   Loleta Books            Modified by: Loleta Books on 03/19/2020 06:17 AM EST      Modified by: Loleta Books on 03/19/2020 07:29 AM EST

## 2020-03-19 NOTE — ED Notes (Signed)
 ED Patient Summary                 [facility]  [address], [place], [place] [address]  [phone]  Discharge Instructions (Patient)  Name:[name] [name], [name] [name]  DOB:  September 14, 1985                   MRN: [id number]                   FIN: [phone]  Reason For Visit: Vaginal bleeding - < [redacted] wks pregnant; CRAMPING/SPOTING/BLEEDING/10 WKS PREG  Final Diagnosis: Threatened miscarriage     Visit Date: 03/19/2020 05:29:00  Address: [address] [place] [address]  Phone: [phone]     Emergency Department Providers:         Primary Physician:      LANE BACHELOR      [facility] would like to thank you for allowing us  to assist you with your healthcare needs. The following includes patient education materials and information regarding your injury/illness.     Follow-up Instructions:  You were seen today on an emergency basis. Please contact your primary care doctor for a follow up appointment. If you received a referral to a specialist doctor, it is important you follow-up as instructed.    It is important that you call your follow-up doctor to schedule and confirm the location of your next appointment. Your doctor may practice at multiple locations. The office location of your follow-up appointment may be different to the one written on your discharge instructions.    If you do not have a primary care doctor, please call [phone] for help in finding a [facility] Provider. For help in finding a specialist doctor, please call [phone].    If your condition gets worse before your follow-up with your primary care doctor or specialist, please return to the Emergency Department.      Coronavirus 2019 (COVID-19) Reminders:     Patients age 98 - 44, with parental consent, and over age 37 can make an appointment for a COVID-19 vaccine. Patients can contact their [facility] doctors' offices to schedule an appointment to  receive the COVID-19 vaccine. Patients who do not have a [facility] physician can call [phone] to schedule vaccination appointments.      Follow Up Appointments:  Primary Care Provider:     Name: LUWANA EARNIE SLUDER     Phone: [phone]                 With: Address: When:   [address]IA CH[address], [address] [place] [address]  [phone] Business (1) In 1 day   Comments:   Return to ED if symptoms worsen                   Medications That Were Updated - Follow Current Instructions  Other Medications  Current ondansetron  (ondansetron  8 mg oral tablet)   Last Dose:____________________    Medications that have not changed  Other Medications  clonazePAM  (clonazePAM  1 mg oral tablet)   Last Dose:____________________  dicyclomine   Last Dose:____________________  FLUoxetine  (FLUoxetine  40 mg oral capsule) 1 Capsules Oral (given by mouth) every day., SOUND ALIKE / LOOK ALIKE - VERIFY DRUG  Last Dose:____________________      Allergy Info: Levaquin; doxycycline     Discharge Additional Information  Discharge Patient 03/19/20 7:30:00 EST      Patient Education Materials:               Understanding Miscarriage: Emotions   Miscarriage is the unplanned end of a pregnancy before 20 weeks.When a miscarriage happens, you?re likely to have a wide range of feelings. Let yourself accept how you feel. Only then can you begin to move on.           No one is to blame   Know that you did not cause this to happen. Miscarriage is very common. There is a 15% chance of miscarriage with each pregnancy (after pregnancy has been diagnosed). Miscarriage usually takes place during the first 10 weeks after conception.   Grief takes many forms   Grief may be the first thing you feel, or it may happen to you later. You may grieve because the future you hoped for feels lost. Grief is painful and often lonely. Buta miscarriage is often easier to deal with over time.   What you feel is OK    No one can tell you how to respond to your miscarriage. If you have been trying to have a child, this loss may feel overwhelming. Perhaps this was an unplanned pregnancy. That doesn?t mean you won?t feel loss. You know yourself best. It?sOK to feel whatever you feel.   A sense of loss   No matter what you thought about being pregnant, having a miscarriage may cause a sense of loss. You may feel as if something is missing. It?s OK if you can?t describe how you feel. At first, it may be enough just to sit with and feel your emotions.   A note for partners   Partners grieve, too. You may be feeling sad, helpless or frustrated. When you?re struggling with your own feelings, knowing how to help your partner may be hard. But do your best to provide support. It can help to:     Be kind to yourself and your partner.     Spend time together.     Fix a meal or bring dinner home.     Rent a movie.     If you have children, spend extra time with them.        2000-2021 [employer], [place]. All rights reserved. This information is not intended as a substitute for professional medical care. Always follow your healthcare professional's instructions.     ---------------------------------------------------------------------------------------------------------------------  [facility]) encourages you to self-enroll in the [facility] Patient Portal.  [facility] Patient Portal will allow you to manage your personal health information securely from your own electronic device now and in the future.  To begin your Patient Portal enrollment process, please visit [email or web address] Click on "Sign up now" under [facility].  If you find that you need additional assistance on the [facility] Patient Portal or need a copy of your medical records, please call the [facility] Medical Records Office at [phone].  Comment:

## 2020-03-19 NOTE — Discharge Summary (Signed)
ED Clinical Summary                         [facility]  [address]  [place], [place], [address]  [phone]           PERSON INFORMATION  Name: SHAWNISE, PETERKIN Age:  35 Years DOB: October 19, 1985   Sex: Female Language: English PCP: [name]   Marital Status:  Single Phone: [phone] Med Service: MED-Medicine   MRN:  [id number] Acct# [id number] Arrival: 03/19/2020 05:29:00   Visit Reason: Vaginal bleeding - < [redacted] wks pregnant; CRAMPING/SPOTING/BLEEDING/10 WKS PREG Acuity: 3 LOS: 000 02:47   Address:      [address] [place] [address]  Diagnosis:      Threatened miscarriage  Printed Prescriptions:            Allergies      doxycycline (Anaphylactic reaction)      Levaquin (Anaphylactic reaction)      Medications Administered During Visit:                  Medication Dose Route   Sodium Chloride 0.9% 1000 mL IV Piggyback   ondansetron 4 mg IV Push   ketorolac 15 mg IV Push       Patient Medication List:              clonazePAM (clonazePAM 1 mg oral tablet)  dicyclomine  FLUoxetine (FLUoxetine 40 mg oral capsule) 1 Capsules Oral (given by mouth) every day., SOUND ALIKE / LOOK ALIKE - VERIFY DRUG  ondansetron (ondansetron 8 mg oral tablet)         Major Tests and Procedures:  The following procedures and tests were performed during your ED visit.  COMMONPROCEDURES%>  COMMON PROCEDURESCOMMENTS%>          Laboratory Orders  Name Status Details   .Preg U POC Completed Urine, RT, RT - Routine, Collected, 03/19/20 6:57:00 EST, Nurse collect, 03/19/20 6:57:00 [place]/Eastern, RAL POC Login   .UA POC Completed Urine, RT, RT - Routine, Collected, 03/19/20 6:58:00 EST, Nurse collect, 03/19/20 6:58:00 US/Eastern, RAL POC Login   BMP Completed Blood, Stat, ST - Stat, 03/19/20 6:10:00 EST, 03/19/20 6:10:00 EST, Nurse collect, POTTER,  ANDREW-DO, Print label Y/N   CBCDIFF Completed Blood, Stat, ST - Stat, 03/19/20 6:10:00 EST, 03/19/20 6:10:00 EST, Nurse collect,  POTTER,  ANDREW-DO, Print label Y/N   EABORh Completed Blood, Stat, ST - Stat, Collected, 03/19/20 6:18:00 EST, Nurse collect, 03/19/20 6:18:00 EST, [id number]   HCG QT Completed Blood, Stat, ST - Stat, 03/19/20 6:10:00 EST, 03/19/20 6:10:00 EST, Nurse collect, POTTER,  ANDREW-DO, Print label Y/N   PABORh Completed Blood, Stat, ST - Stat, 03/19/20 6:10:00 EST, Nurse collect, Print label Y/N   Rh Immune Globulin Completed Blood, Stat, ST - Stat, Collected, 03/19/20 7:20:00 EST POTTER,  ANDREW-DO, Rhogam Injection 300 mcg IM, Print label Y/N               Radiology Orders  Name Status Details   [place] Pregnancy Transvaginal Ordered 03/19/20 6:10:00 EST, STAT 1 hour or less, Reason: Pregnancy related conditions, unspecified, unspecified trimester, Transport Mode: STRETCHER, pp_set_radiology_subspecialty               Patient Care Orders  Name Status Details   Discharge Patient Ordered 03/19/20 7:30:00 EST   Discontinue IV Completed 03/19/20 7:30:20 EST, 03/19/20 7:30:20 EST   ED Assessment Adult Completed 03/19/20  5:53:08 EST, 03/19/20 5:53:08 EST   ED Secondary Triage Completed 03/19/20 5:53:08 EST, 03/19/20 5:53:08 EST   ED Triage Adult Completed 03/19/20 5:30:47 EST, 03/19/20 5:30:47 EST   Order to Transfuse Completed 03/19/20 7:20:00 EST, RhoGAM injection, 03/19/20 7:20:00 EST, Injection, Other: Enter Special Instructions   POC-Urine Dipstick collect Completed 03/19/20 6:10:00 EST, Once, 03/19/20 6:10:00 EST   POC-Urine Pregnancy Test collect Completed 03/19/20 6:10:00 EST, Once, 03/19/20 6:10:00 EST             PROVIDER INFORMATION               Provider Role Assigned Lowanda Foster ED Provider 03/19/2020 06:01:27    Karolee Ohs, RN, Harriett Sine A ED Nurse 03/19/2020 06:09:41    Dione Plover L-RN ED Nurse 03/19/2020 07:38:12        Attending Physician:  Loleta Books     Admit Doc  POTTER,  ANDREW-DO     Consulting Doc       VITALS INFORMATION  Vital Sign Triage Latest   Temp Oral ORAL_1%>36.8 degC  ORAL%>36.7 degC   Temp Temporal TEMPORAL_1%> TEMPORAL%>   Temp Intravascular INTRAVASCULAR_1%> INTRAVASCULAR%>   Temp Axillary AXILLARY_1%> AXILLARY%>   Temp Rectal RECTAL_1%> RECTAL%>   02 Sat 98 % 99 %   Respiratory Rate RATE_1%>18 br/min RATE%>20 br/min   Peripheral Pulse Rate PULSE RATE_1%> PULSE RATE%>   Apical Heart Rate HEART RATE_1%> HEART RATE%>   Blood Pressure BLOOD PRESSURE_1%>/ BLOOD PRESSURE_1%>83 mmHg BLOOD PRESSURE%>117 mmHg / BLOOD PRESSURE%>81 mmHg                 Immunizations      No Immunizations Documented This Visit          DISCHARGE INFORMATION   Discharge Disposition: H Outpt-Sent Home   Discharge Location:    Home   Discharge Date and Time:    03/19/2020 08:16:22   ED Checkout Date and Time:    03/19/2020 08:16:22     DEPART REASON INCOMPLETE INFORMATION               Depart Action Incomplete Reason   Interactive View/I&O Recently assessed               Problems      No Problems Documented              Smoking Status      Former smoker, quit more than 30 days ago         PATIENT EDUCATION INFORMATION  Instructions:       Understanding Miscarriage: Emotions     Follow up:                    With: Address: When:   LOUISE WANNAMAKER [address], [address] [place] [address]  [phone] Business (1) In 1 day   Comments:   Return to ED if symptoms worsen           ED PROVIDER DOCUMENTATION     Patient:   LAYLI, CAPSHAW             MRN: [id number]            FIN: [id number]               Age:   88 years     Sex:  Female     DOB:  May 18, 1985   Associated Diagnoses:   Threatened miscarriage   Author:   Loleta Books  Basic Information   Time seen: Provider Seen (ST)   ED Provider/Time:    POTTER,  ANDREW-DO / 03/19/2020 06:01  .   Additional information: Chief Complaint from Nursing Triage Note   Chief Complaint  Chief Complaint: c/o lower abd cramping and vaginal bledding that started 2 hrs ago. pt reports passing clots. lmp 01/09/2020. g4p1. pt has appt with dr. [name], but  has seen her yet. (03/19/20 05:44:00).      History of Present Illness   35 year old female 644, P1 with a history of generalized anxiety, Crohn's disease presenting out of concern for possible miscarriage has had intermittent-like vaginal bleeding since her pregnancy test that was taken early November.  She reportedly is 5 weeks but has yet to see an OB with a confirmed confirmation of her pregnancy.  She notes slightly improved clotting and bleeding this morning but having some lower abdominal crampiness.  Otherwise has no fever chills nausea vomiting or diarrhea.  No further complaints or concerns she reportedly is B-.  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  No further alleviating or provoking complaints not worsening.      Review of Systems   Constitutional symptoms:  No fever, no chills, no sweats, no generalized weakness, no fatigue, no decreased activity.    Skin symptoms:  No jaundice, no rash.    Eye symptoms:  Negative except as documented in HPI.   ENMT symptoms:  Negative except as documented in HPI.   Respiratory symptoms:  Negative except as documented in HPI.   Cardiovascular symptoms:  No chest pain, no palpitations, no tachycardia, no syncope, no diaphoresis.    Gastrointestinal symptoms:  No abdominal pain, no nausea, no vomiting, no diarrhea.    Genitourinary symptoms:  Vaginal bleeding, no hematuria, no vaginal discharge.    Musculoskeletal symptoms:  No back pain, no Muscle pain, no Joint pain.    Neurologic symptoms:  No headache, no dizziness, no altered level of consciousness, no numbness, no tingling, no focal weakness.    Psychiatric symptoms:  No anxiety,              Additional review of systems information: All other systems reviewed and otherwise negative.      Health Status   Allergies:    Allergic Reactions (Selected)  Severe  Doxycycline- Anaphylactic reaction.  Levaquin- Anaphylactic reaction..      Past Medical/ Family/ Social History    Medical history: Reviewed as documented in chart.   Surgical history: Reviewed as documented in chart.   Family history: Not significant.   Social history: Reviewed as documented in chart.   Problem list:    Active Problems (6)  Anxiety and depression   Biliary dyskinesia   Colitis   Crohn disease   Panic attacks   PCOS (polycystic ovarian syndrome)   .      Physical Examination               Vital Signs   Vital Signs   03/19/2020 5:44 EST Systolic Blood Pressure 122 mmHg    Diastolic Blood Pressure 83 mmHg    Temperature Oral 36.8 degC    Heart Rate Monitored 100 bpm    Respiratory Rate 18 br/min    SpO2 98 %   .   Measurements   03/19/2020 5:53 EST Body Mass Index est meas 26.38 kg/m2    Body Mass Index Measured 26.38 kg/m2   03/19/2020 5:44 EST Height/Length Measured 159 cm  Weight Dosing 66.7 kg   .   Basic Oxygen Information   03/19/2020 5:44 EST Oxygen Therapy Room air    SpO2 98 %   .   General:  Alert, no acute distress.    Glasgow coma scale:  Total score: Total score: 15.   Neurological:  Alert and oriented to person, place, time, and situation.   Skin:  Warm, intact.    Head:  Normocephalic, atraumatic.    Neck:  Supple, trachea midline.    Eye:  Normal conjunctiva.   Ears, nose, mouth and throat:  Oral mucosa moist.   Cardiovascular   Respiratory:  Respirations are non-labored.   Chest wall:  No tenderness.   Back:  Nontender, Normal range of motion.    Musculoskeletal:  Normal ROM.   Gastrointestinal:  Soft.   Genitourinary:  minimal bleeding  .   Psychiatric:  Cooperative.      Reexamination/ Reevaluation   Course: improving.   Pain status: decreased.   Assessment: Spoke with Dr. Campbell Lerner regarding patient's ultrasound findings and concerns for possible retained products versus a small endometrial polyp.  Patient has [place] no complaints and symptoms of Dr. Delena Bali agrees with outpatient follow-up in the morning no need for emergent D&C.  Patient has no fevers or chills or signs of endometritis  or further infectious-like uterine processes.  She has had a normal lab work-up and she has no signs of urinary infection.  We will continue outpatient pain meds as prescribed return for acute change or worsening without fail..      Impression and Plan   Diagnosis   Threatened miscarriage (ICD10-CM O20.0, Discharge, Medical)   Plan   Condition: Improved, Stable.    Disposition: Discharged: to home.    Patient was given the following educational materials: Understanding Miscarriage: Emotions.    Follow up with: Carita Pian In 1 day Return to ED if symptoms worsen.    Counseled: Patient, I had a detailed discussion with the patient and/or guardian regarding the historical points/exam findings supporting the discharge diagnosis and need for outpatient followup. Discussed the need to return to the ER if symptoms persist/worsen, or for any questions/concerns that arise at home.

## 2020-03-19 NOTE — ED Notes (Signed)
 ED Patient Education Note     Patient Education Materials Follows:  Ambulatory            Understanding Miscarriage: Emotions   Miscarriage is the unplanned end of a pregnancy before 20 weeks.When a miscarriage happens, you?re likely to have a wide range of feelings. Let yourself accept how you feel. Only then can you begin to move on.           No one is to blame   Know that you did not cause this to happen. Miscarriage is very common. There is a 15% chance of miscarriage with each pregnancy (after pregnancy has been diagnosed). Miscarriage usually takes place during the first 10 weeks after conception.   Grief takes many forms   Grief may be the first thing you feel, or it may happen to you later. You may grieve because the future you hoped for feels lost. Grief is painful and often lonely. Buta miscarriage is often easier to deal with over time.   What you feel is OK   No one can tell you how to respond to your miscarriage. If you have been trying to have a child, this loss may feel overwhelming. Perhaps this was an unplanned pregnancy. That doesn?t mean you won?t feel loss. You know yourself best. It?sOK to feel whatever you feel.   A sense of loss   No matter what you thought about being pregnant, having a miscarriage may cause a sense of loss. You may feel as if something is missing. It?s OK if you can?t describe how you feel. At first, it may be enough just to sit with and feel your emotions.   A note for partners   Partners grieve, too. You may be feeling sad, helpless or frustrated. When you?re struggling with your own feelings, knowing how to help your partner may be hard. But do your best to provide support. It can help to:     Be kind to yourself and your partner.     Spend time together.     Fix a meal or bring dinner home.     Rent a movie.     If you have children, spend extra time with them.        2000-2021 [employer], [place]. All rights reserved. This information is not intended as  a substitute for professional medical care. Always follow your healthcare professional's instructions.

## 2021-04-09 ENCOUNTER — Ambulatory Visit: Admit: 2021-04-09 | Discharge: 2021-04-09 | Payer: MEDICAID | Attending: Family Medicine | Primary: Family Medicine

## 2021-04-09 DIAGNOSIS — F418 Other specified anxiety disorders: Secondary | ICD-10-CM

## 2021-04-09 MED ORDER — CLONAZEPAM 0.5 MG PO TABS
0.5 MG | ORAL_TABLET | Freq: Two times a day (BID) | ORAL | 0 refills | Status: DC
Start: 2021-04-09 — End: 2021-06-20

## 2021-04-09 MED ORDER — CLINDAMYCIN PHOSPHATE 1 % EX GEL
1 | CUTANEOUS | 1 refills | 30.00000 days | Status: AC
Start: 2021-04-09 — End: 2021-04-16

## 2021-04-09 MED ORDER — FLUOXETINE HCL 20 MG PO CAPS
20 MG | ORAL_CAPSULE | Freq: Every day | ORAL | 0 refills | Status: DC
Start: 2021-04-09 — End: 2021-06-05

## 2021-04-09 NOTE — Progress Notes (Signed)
CHIEF COMPLAINT:  Chief Complaint   Patient presents with    Anxiety     PT REPORTS D/C MENTAL HEALTH MEDICATION IN July,"NOT DOING WELL", BUMPS UNDER CHIN ALSO SINCE STOPPING MEDS.   PT REPORTS BEING TREATED AT [facility] FOR KIDNEY INFECTION 2 WEEKS AGO.        HISTORY OF PRESENT ILLNESS:  [name] [name] is a 36 y.o. female  who presents to re-est care.  She says that she recently moved nearby.  She says she stopped her medications and is not doing well.  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  Depression is coming back.       PHQ:  PHQ-9  04/09/2021   Little interest or pleasure in doing things 3   Feeling down, depressed, or hopeless 1   Trouble falling or staying asleep, or sleeping too much 3   Feeling tired or having little energy 3   Poor appetite or overeating 3   Feeling bad about yourself - or that you are a failure or have let yourself or your family down 3   Trouble concentrating on things, such as reading the newspaper or watching television 3   Moving or speaking so slowly that other people could have noticed. Or the opposite - being so fidgety or restless that you have been moving around a lot more than usual 3   Thoughts that you would be better off dead, or of hurting yourself in some way 0   PHQ-2 Score 4   PHQ-9 Total Score 22   If you checked off any problems, how difficult have these problems made it for you to do your work, take care of things at home, or get along with other people? 3       CURRENT MEDICATION LIST:    Current Outpatient Medications   Medication Sig Dispense Refill    Multiple Vitamin (MULTIVITAMIN ADULT PO) Take by mouth daily      FLUoxetine (PROZAC) 20 MG capsule Take 1 capsule by mouth daily 30 capsule 0    clonazePAM (KLONOPIN) 0.5 MG tablet Take 1 tablet by mouth 2 times daily for 30 days. Max Daily Amount: 1 mg 60 tablet 0    sulfamethoxazole-trimethoprim (BACTRIM DS;SEPTRA DS) 800-160 MG per tablet TAKE 1 TABLET BY MOUTH EVERY 12 HOURS FOR 10 DAYS  (Patient not taking: Reported on 04/09/2021)      ondansetron (ZOFRAN-ODT) 4 MG disintegrating tablet PLACE 1 TABLET ON TONGUE AND DISSOLVE EVERY 8 HOURS AS NEEDED FOR NAUSEA (Patient not taking: Reported on 04/09/2021)       No current facility-administered medications for this visit.        ALLERGIES:    Allergies   Allergen Reactions    Doxycycline Calcium Anaphylaxis    Levofloxacin Anaphylaxis    Acetaminophen      Other reaction(s): Nausea    Cephalexin      Other reaction(s): Nausea        HISTORY:  Past Medical History:   Diagnosis Date    Anxiety       History reviewed. No pertinent surgical history.   Social History     Socioeconomic History    Marital status: Single     Spouse name: Not on file    Number of children: Not on file    Years of education: Not on file    Highest education level: Not on file   Occupational History    Not on  file   Tobacco Use    Smoking status: Former     Packs/day: 0.50     Years: 5.00     Pack years: 2.50     Types: Cigarettes     Quit date: 12/2020     Years since quitting: 0.4    Smokeless tobacco: Never   Vaping Use    Vaping Use: Never used   Substance and Sexual Activity    Alcohol use: Not Currently    Drug use: Never    Sexual activity: Yes   Other Topics Concern    Not on file   Social History Narrative    Not on file     Social Determinants of Health     Financial Resource Strain: Not on file   Food Insecurity: Not on file   Transportation Needs: Not on file   Physical Activity: Not on file   Stress: Not on file   Social Connections: Not on file   Intimate Partner Violence: Not on file   Housing Stability: Not on file      Family History   Problem Relation Age of Onset    Breast Cancer Mother         REVIEW OF SYSTEMS:  Review of Systems   Constitutional:  Negative for fatigue and unexpected weight change.   Eyes:  Negative for visual disturbance.   Respiratory:  Negative for chest tightness and shortness of breath.    Cardiovascular:  Negative for chest pain and  palpitations.   Neurological:  Negative for dizziness, speech difficulty, light-headedness and headaches.   Psychiatric/Behavioral:  Positive for dysphoric mood. The patient is nervous/anxious.       PHYSICAL EXAM:  Vital Signs -   Visit Vitals  BP 92/60   Pulse 76   Temp 98 ??F (36.7 ??C)   Resp 16   Wt 151 lb (68.5 kg)   SpO2 97%        Physical Exam  Vitals and nursing note reviewed.   Constitutional:       General: She is not in acute distress.     Appearance: Normal appearance.   HENT:      Head: Normocephalic and atraumatic.   Eyes:      Extraocular Movements: Extraocular movements intact.      Conjunctiva/sclera: Conjunctivae normal.   Cardiovascular:      Rate and Rhythm: Normal rate and regular rhythm.      Heart sounds: Normal heart sounds.   Pulmonary:      Effort: Pulmonary effort is normal. No respiratory distress.      Breath sounds: Normal breath sounds. No wheezing, rhonchi or rales.   Musculoskeletal:      Cervical back: Neck supple.      Left lower leg: No edema.   Neurological:      General: No focal deficit present.      Mental Status: She is alert and oriented to person, place, and time. Mental status is at baseline.   Psychiatric:         Mood and Affect: Mood normal.         Behavior: Behavior normal.          LABS  No results found for this visit on 04/09/21.  [facility] Historical Encounter on 09/23/2019   Component Date Value Ref Range Status    Diagnosis: 09/23/2019 Comment   Final    Comment: UNSATISFACTORY FOR EVALUATION.  SPECIMEN REPROCESSED FOR INTERPRETATION USING GLACIAL ACETIC ACID  (  GAA).      Specimen adequacy: 09/23/2019 Comment   Final    Comment: Specimen processed and examined, but unsatisfactory for evaluation of  epithelial abnormality because of insufficient squamous component. The  preparation consists almost entirely of red blood cells.      Clinician Provided ICD 09/23/2019 Comment   Final    Comment: Z12.4  Z11.51      Performed by: 09/23/2019 Comment   Final    Talphine  Crank-Haire, Cytotechnologist (ASCP)    QC reviewed by: 09/23/2019 Comment   Final    Elie Confer, Cytotechnologist (ASCP)    . 09/23/2019 .   Final    Note: 09/23/2019 Comment   Final    Comment: The Pap smear is a screening test designed to aid in the detection of  premalignant and malignant conditions of the uterine cervix.  It is not a  diagnostic procedure and should not be used as the sole means of detecting  cervical cancer.  Both false-positive and false-negative reports do occur.  .      Methodology: 09/23/2019 CTIM   Final    Comment: The Thin Prep(R) Imager was unable to read this specimen.  Therefore  a manual review was performed.      HPV Aptima 09/23/2019 Negative  Negative Final    Comment: This nucleic acid amplification test detects fourteen high-risk  HPV types (16,18,31,33,35,39,45,51,52,56,58,59,66,68) without  differentiation.  PERFORMING LAB: [facility], [address] [place], [place] [id number], Phone - [phone], Director - MDNagendra  Unless Otherwise Notified, Test Performed At : [facility], , [place] , [id number] [phone]  [address] [place], [place], [address] [phone]         IMPRESSION/PLAN    1. Mixed anxiety and depressive disorder  -     FLUoxetine (PROZAC) 20 MG capsule; Take 1 capsule by mouth daily, Disp-30 capsule, R-0Normal  -     clonazePAM (KLONOPIN) 0.5 MG tablet; Take 1 tablet by mouth 2 times daily for 30 days. Max Daily Amount: 1 mg, Disp-60 tablet, R-0Normal     Restart medications.  Pt has tolerated well previously.  Because of her insurance plan, there are very limited psychiatry services available in the area and with extensive waits.  Need to seek hospital care if having severe symptoms or thoughts of self harm.    Follow up and Dispositions:  Return in about 3 weeks (around 04/30/2021).       Rockwell Alexandria, MD

## 2021-04-09 NOTE — Telephone Encounter (Signed)
ERROR

## 2021-04-30 ENCOUNTER — Encounter: Attending: Family Medicine | Primary: Family Medicine

## 2021-05-02 ENCOUNTER — Encounter: Payer: MEDICAID | Attending: Family Medicine | Primary: Family Medicine

## 2021-05-04 ENCOUNTER — Ambulatory Visit: Admit: 2021-05-04 | Discharge: 2021-05-04 | Payer: MEDICAID | Attending: Family Medicine | Primary: Family Medicine

## 2021-05-04 DIAGNOSIS — F419 Anxiety disorder, unspecified: Secondary | ICD-10-CM

## 2021-05-04 MED ORDER — FLUOXETINE HCL 20 MG PO CAPS
20 MG | ORAL_CAPSULE | Freq: Every day | ORAL | 0 refills | Status: AC
Start: 2021-05-04 — End: 2021-06-05

## 2021-05-04 MED ORDER — CLONAZEPAM 1 MG PO TABS
1 MG | ORAL_TABLET | Freq: Two times a day (BID) | ORAL | 0 refills | Status: DC | PRN
Start: 2021-05-04 — End: 2021-06-05

## 2021-05-04 MED ORDER — FLUOXETINE HCL 10 MG PO CAPS
10 MG | ORAL_CAPSULE | Freq: Every day | ORAL | 0 refills | Status: DC
Start: 2021-05-04 — End: 2021-06-19

## 2021-05-04 NOTE — Progress Notes (Signed)
CHIEF COMPLAINT:  Chief Complaint   Patient presents with    Other     Pt recently put back on prozac and klonopin She is feeling better        HISTORY OF PRESENT ILLNESS:  [name] [name] is a 36 y.o. female  who presents for follow up visit.  She has a history of anxiety, and had been off of her medication and suffering from some difficulties.  After having a panic attack at work, she came back to restart treatment.  She is feeling better already.  She is not quite back to her prior doses, and certainly not 100%, but better.    PHQ:  PHQ-9  05/04/2021   Little interest or pleasure in doing things 1   Feeling down, depressed, or hopeless 1   Trouble falling or staying asleep, or sleeping too much -   Feeling tired or having little energy -   Poor appetite or overeating -   Feeling bad about yourself - or that you are a failure or have let yourself or your family down -   Trouble concentrating on things, such as reading the newspaper or watching television -   Moving or speaking so slowly that other people could have noticed. Or the opposite - being so fidgety or restless that you have been moving around a lot more than usual -   Thoughts that you would be better off dead, or of hurting yourself in some way -   PHQ-2 Score 2   PHQ-9 Total Score 2   If you checked off any problems, how difficult have these problems made it for you to do your work, take care of things at home, or get along with other people? -       CURRENT MEDICATION LIST:    Current Outpatient Medications   Medication Sig Dispense Refill    FLUoxetine (PROZAC) 20 MG capsule Take 1 capsule by mouth daily 30 capsule 0    FLUoxetine (PROZAC) 10 MG capsule Take 1 capsule by mouth daily 30 capsule 0    clonazePAM (KLONOPIN) 1 MG tablet Take 1 tablet by mouth 2 times daily as needed for Anxiety for up to 30 days. Max Daily Amount: 2 mg 60 tablet 0    Multiple Vitamin (MULTIVITAMIN ADULT PO) Take by mouth daily      FLUoxetine (PROZAC) 20 MG capsule Take 1 capsule  by mouth daily 30 capsule 0    clonazePAM (KLONOPIN) 0.5 MG tablet Take 1 tablet by mouth 2 times daily for 30 days. Max Daily Amount: 1 mg 60 tablet 0    sulfamethoxazole-trimethoprim (BACTRIM DS;SEPTRA DS) 800-160 MG per tablet TAKE 1 TABLET BY MOUTH EVERY 12 HOURS FOR 10 DAYS (Patient not taking: No sig reported)      ondansetron (ZOFRAN-ODT) 4 MG disintegrating tablet PLACE 1 TABLET ON TONGUE AND DISSOLVE EVERY 8 HOURS AS NEEDED FOR NAUSEA (Patient not taking: No sig reported)       No current facility-administered medications for this visit.        ALLERGIES:    Allergies   Allergen Reactions    Doxycycline Calcium Anaphylaxis    Levofloxacin Anaphylaxis    Acetaminophen      Other reaction(s): Nausea    Cephalexin      Other reaction(s): Nausea        HISTORY:  Past Medical History:   Diagnosis Date    Anxiety       History reviewed. No pertinent surgical  history.   Social History     Socioeconomic History    Marital status: Single     Spouse name: Not on file    Number of children: Not on file    Years of education: Not on file    Highest education level: Not on file   Occupational History    Not on file   Tobacco Use    Smoking status: Former     Packs/day: 0.50     Years: 5.00     Pack years: 2.50     Types: Cigarettes     Quit date: 12/2020     Years since quitting: 0.4    Smokeless tobacco: Never   Vaping Use    Vaping Use: Never used   Substance and Sexual Activity    Alcohol use: Not Currently    Drug use: Never    Sexual activity: Yes   Other Topics Concern    Not on file   Social History Narrative    Not on file     Social Determinants of Health     Financial Resource Strain: Not on file   Food Insecurity: Not on file   Transportation Needs: Not on file   Physical Activity: Not on file   Stress: Not on file   Social Connections: Not on file   Intimate Partner Violence: Not on file   Housing Stability: Not on file      Family History   Problem Relation Age of Onset    Breast Cancer Mother          REVIEW OF SYSTEMS:  Review of Systems   Constitutional:  Negative for fatigue and unexpected weight change.   Eyes:  Negative for visual disturbance.   Respiratory:  Negative for chest tightness and shortness of breath.    Cardiovascular:  Negative for chest pain, palpitations and leg swelling.   Neurological:  Negative for dizziness, speech difficulty, light-headedness and headaches.      PHYSICAL EXAM:  Vital Signs -   Visit Vitals  BP 110/70   Pulse 82   Ht 5\' 3"  (1.6 m)   Wt 149 lb (67.6 kg)   SpO2 98%   BMI 26.39 kg/m??        Physical Exam  HENT:      Head: Normocephalic.      Nose: Nose normal.   Cardiovascular:      Rate and Rhythm: Normal rate and regular rhythm.      Pulses: Normal pulses.   Pulmonary:      Effort: Pulmonary effort is normal. No respiratory distress.   Musculoskeletal:         General: Normal range of motion.   Skin:     General: Skin is warm.   Neurological:      General: No focal deficit present.      Mental Status: She is alert.   Psychiatric:         Behavior: Behavior normal.         Thought Content: Thought content normal.         Judgment: Judgment normal.          LABS  No results found for this visit on 05/04/21.  [facility] Historical Encounter on 09/23/2019   Component Date Value Ref Range Status    Diagnosis: 09/23/2019 Comment   Final    Comment: UNSATISFACTORY FOR EVALUATION.  SPECIMEN REPROCESSED FOR INTERPRETATION USING GLACIAL ACETIC ACID  (GAA).  Specimen adequacy: 09/23/2019 Comment   Final    Comment: Specimen processed and examined, but unsatisfactory for evaluation of  epithelial abnormality because of insufficient squamous component. The  preparation consists almost entirely of red blood cells.      Clinician Provided ICD 09/23/2019 Comment   Final    Comment: Z12.4  Z11.51      Performed by: 09/23/2019 Comment   Final    Talphine Crank-Haire, Cytotechnologist (ASCP)    QC reviewed by: 09/23/2019 Comment   Final    Elie Confer, Cytotechnologist (ASCP)    .  09/23/2019 .   Final    Note: 09/23/2019 Comment   Final    Comment: The Pap smear is a screening test designed to aid in the detection of  premalignant and malignant conditions of the uterine cervix.  It is not a  diagnostic procedure and should not be used as the sole means of detecting  cervical cancer.  Both false-positive and false-negative reports do occur.  .      Methodology: 09/23/2019 CTIM   Final    Comment: The Thin Prep(R) Imager was unable to read this specimen.  Therefore  a manual review was performed.      HPV Aptima 09/23/2019 Negative  Negative Final    Comment: This nucleic acid amplification test detects fourteen high-risk  HPV types (16,18,31,33,35,39,45,51,52,56,58,59,66,68) without  differentiation.  PERFORMING LAB: [facility], [address], [place], [place] [id number], Phone - [phone], Director - MDNagendra  Unless Otherwise Notified, Test Performed At : [facility], , [place] , [id number] [phone]  [address] [place], [place], [address] [phone]         IMPRESSION/PLAN    1. Anxiety  -     clonazePAM (KLONOPIN) 1 MG tablet; Take 1 tablet by mouth 2 times daily as needed for Anxiety for up to 30 days. Max Daily Amount: 2 mg, Disp-60 tablet, R-0Normal  2. Mixed anxiety and depressive disorder     Continue current medications with slight adjustment of doses.    No issues or concerns noted with treatment thus far.     Follow up and Dispositions:  Return in about 3 months (around 08/04/2021).       Rockwell Alexandria, MD

## 2021-06-04 ENCOUNTER — Encounter

## 2021-06-04 NOTE — Telephone Encounter (Signed)
Requested Prescriptions     Pending Prescriptions Disp Refills    FLUoxetine (PROZAC) 20 MG capsule 30 capsule 0     Sig: Take 1 capsule by mouth daily    clonazePAM (KLONOPIN) 1 MG tablet 60 tablet 0     Sig: Take 1 tablet by mouth 2 times daily as needed for Anxiety for up to 30 days. Max Daily Amount: 2 mg

## 2021-06-04 NOTE — Telephone Encounter (Signed)
Patient calls to schedule an appointment with Dr. Kandee Keen and request a refill of fluoxetine and [facility]epam to [facility] [address].

## 2021-06-05 MED ORDER — FLUOXETINE HCL 20 MG PO CAPS
20 MG | ORAL_CAPSULE | Freq: Every day | ORAL | 0 refills | Status: DC
Start: 2021-06-05 — End: 2021-06-19

## 2021-06-05 MED ORDER — CLONAZEPAM 1 MG PO TABS
1 MG | ORAL_TABLET | Freq: Two times a day (BID) | ORAL | 0 refills | Status: DC | PRN
Start: 2021-06-05 — End: 2021-07-09

## 2021-06-05 NOTE — Telephone Encounter (Signed)
Sent!

## 2021-06-07 ENCOUNTER — Encounter: Payer: MEDICAID | Attending: Family Medicine | Primary: Family Medicine

## 2021-06-19 ENCOUNTER — Telehealth: Admit: 2021-06-19 | Discharge: 2021-06-19 | Payer: MEDICAID | Attending: Family Medicine | Primary: Family Medicine

## 2021-06-19 DIAGNOSIS — R21 Rash and other nonspecific skin eruption: Secondary | ICD-10-CM

## 2021-06-19 DIAGNOSIS — F418 Other specified anxiety disorders: Secondary | ICD-10-CM

## 2021-06-19 MED ORDER — FLUOXETINE HCL 10 MG PO CAPS
10 MG | ORAL_CAPSULE | Freq: Every day | ORAL | 5 refills | Status: DC
Start: 2021-06-19 — End: 2021-09-20

## 2021-06-19 MED ORDER — FLUOXETINE HCL 20 MG PO CAPS
20 MG | ORAL_CAPSULE | Freq: Every day | ORAL | 5 refills | Status: DC
Start: 2021-06-19 — End: 2021-08-09

## 2021-06-19 MED ORDER — KETOCONAZOLE 2 % EX CREA
2 % | CUTANEOUS | 1 refills | Status: DC
Start: 2021-06-19 — End: 2022-05-31

## 2021-06-19 NOTE — Progress Notes (Signed)
CHIEF COMPLAINT:  Chief Complaint   Patient presents with    Follow-up      [name] [name], was evaluated through a synchronous (real-time) audio-video encounter. The patient (or guardian if applicable) is aware that this is a billable service, which includes applicable co-pays. This Virtual Visit was conducted with patient's (and/or legal guardian's) consent. The visit was conducted pursuant to the emergency declaration under the [employer] and the [employer], 1135 waiver authority and the [employer] Act.  Patient identification was verified, and a caregiver was present when appropriate.   The patient was located at Home: [address]  [place]  [place] [address]  Provider was located at [facility] (Appt Dept): [address]  [address] [place],  [place] [address]         Total time spent for this encounter: Not billed by time    --Rockwell Alexandria, MD on 06/20/2021 at 5:56 AM    An electronic signature was used to authenticate this note.   HISTORY OF PRESENT ILLNESS:  [name] [name] is a 36 y.o. female  who presents virtually for cc of Ringworm.   She states that her daughter recently ad a case of this also.  She has two spots on her arm.  She used a bit of the cream that her daughter had and it has improved but is not completely better.  She feels like her mood is much better overall, but it has been really difficult dealing with a recent illness of her daughter's which might be an autoimmune condition. She has had an issues getting her 10 mg fluoxetine from pharmacy but she is now taking 30 mg daily along with klonopin and doing fairly well.     PHQ:  PHQ-9  06/19/2021   Little interest or pleasure in doing things 0   Feeling down, depressed, or hopeless 0   Trouble falling or staying asleep, or sleeping too much 1   Feeling tired or having little energy 1   Poor appetite or overeating 0   Feeling bad about yourself - or that you are a  failure or have let yourself or your family down 0   Trouble concentrating on things, such as reading the newspaper or watching television 0   Moving or speaking so slowly that other people could have noticed. Or the opposite - being so fidgety or restless that you have been moving around a lot more than usual 0   Thoughts that you would be better off dead, or of hurting yourself in some way 0   PHQ-2 Score 0   PHQ-9 Total Score 2   If you checked off any problems, how difficult have these problems made it for you to do your work, take care of things at home, or get along with other people? 0       CURRENT MEDICATION LIST:    Current Outpatient Medications   Medication Sig Dispense Refill    ketoconazole (NIZORAL) 2 % cream Apply topically daily. 30 g 1    FLUoxetine (PROZAC) 10 MG capsule Take 1 capsule by mouth daily With the 20 mg for a total of 30 mg 30 capsule 5    FLUoxetine (PROZAC) 20 MG capsule Take 1 capsule by mouth daily With the 10 mg for a total of 30 mg 30 capsule 5    clonazePAM (KLONOPIN) 1 MG tablet Take 1 tablet by mouth 2 times daily as needed for  Anxiety for up to 30 days. Max Daily Amount: 2 mg 60 tablet 0    Multiple Vitamin (MULTIVITAMIN ADULT PO) Take by mouth daily       No current facility-administered medications for this visit.        ALLERGIES:    Allergies   Allergen Reactions    Doxycycline Calcium Anaphylaxis    Levofloxacin Anaphylaxis    Acetaminophen      Other reaction(s): Nausea    Cephalexin      Other reaction(s): Nausea        HISTORY:  Past Medical History:   Diagnosis Date    Anxiety       History reviewed. No pertinent surgical history.   Social History     Socioeconomic History    Marital status: Single     Spouse name: Not on file    Number of children: Not on file    Years of education: Not on file    Highest education level: Not on file   Occupational History    Not on file   Tobacco Use    Smoking status: Former     Packs/day: 0.50     Years: 5.00     Pack years: 2.50      Types: Cigarettes     Quit date: 12/2020     Years since quitting: 0.5    Smokeless tobacco: Never   Vaping Use    Vaping Use: Never used   Substance and Sexual Activity    Alcohol use: Not Currently    Drug use: Never    Sexual activity: Yes   Other Topics Concern    Not on file   Social History Narrative    Not on file     Social Determinants of Health     Financial Resource Strain: Not on file   Food Insecurity: Not on file   Transportation Needs: Not on file   Physical Activity: Not on file   Stress: Not on file   Social Connections: Not on file   Intimate Partner Violence: Not on file   Housing Stability: Not on file      Family History   Problem Relation Age of Onset    Breast Cancer Mother         REVIEW OF SYSTEMS:  Review of Systems   Constitutional:  Negative for chills and fever.   Respiratory:  Negative for cough.    Cardiovascular:  Negative for chest pain.   Skin:  Positive for rash.      PHYSICAL EXAM:  Vital Signs -   There were no vitals taken for this visit.     Physical Exam  Constitutional:       Appearance: Normal appearance.   Skin:     Findings: Rash present.      Comments: Small erythematous patch on arm   Neurological:      Mental Status: She is alert.          LABS  No results found for this visit on 06/19/21.  [facility] Historical Encounter on 09/23/2019   Component Date Value Ref Range Status    Diagnosis: 09/23/2019 Comment   Final    Comment: UNSATISFACTORY FOR EVALUATION.  SPECIMEN REPROCESSED FOR INTERPRETATION USING GLACIAL ACETIC ACID  (GAA).      Specimen adequacy: 09/23/2019 Comment   Final    Comment: Specimen processed and examined, but unsatisfactory for evaluation of  epithelial abnormality because of insufficient squamous component. The  preparation consists almost entirely of red blood cells.      Clinician Provided ICD 09/23/2019 Comment   Final    Comment: Z12.4  Z11.51      Performed by: 09/23/2019 Comment   Final    Talphine Crank-Haire, Cytotechnologist (ASCP)    QC  reviewed by: 09/23/2019 Comment   Final    Elie Confer, Cytotechnologist (ASCP)    . 09/23/2019 .   Final    Note: 09/23/2019 Comment   Final    Comment: The Pap smear is a screening test designed to aid in the detection of  premalignant and malignant conditions of the uterine cervix.  It is not a  diagnostic procedure and should not be used as the sole means of detecting  cervical cancer.  Both false-positive and false-negative reports do occur.  .      Methodology: 09/23/2019 CTIM   Final    Comment: The Thin Prep(R) Imager was unable to read this specimen.  Therefore  a manual review was performed.      HPV Aptima 09/23/2019 Negative  Negative Final    Comment: This nucleic acid amplification test detects fourteen high-risk  HPV types (16,18,31,33,35,39,45,51,52,56,58,59,66,68) without  differentiation.  PERFORMING LAB: [facility], [address], [place], [place] [id number], Phone - [phone], Director - MDNagendra  Unless Otherwise Notified, Test Performed At : [facility], , [place] , [id number] [phone]  [address] [place], [place], [address] [phone]         IMPRESSION/PLAN    1. Rash  -     ketoconazole (NIZORAL) 2 % cream; Apply topically daily., Disp-30 g, R-1, Normal  2. Mixed anxiety and depressive disorder  -     FLUoxetine (PROZAC) 10 MG capsule; Take 1 capsule by mouth daily With the 20 mg for a total of 30 mg, Disp-30 capsule, R-5Normal  -     FLUoxetine (PROZAC) 20 MG capsule; Take 1 capsule by mouth daily With the 10 mg for a total of 30 mg, Disp-30 capsule, R-5Normal     Rash appears consistent with ringworm.  Be sure to continue topical antifungal until fully resolved.      Follow up and Dispositions:  No follow-ups on file.       Rockwell Alexandria, MD

## 2021-07-09 ENCOUNTER — Encounter

## 2021-07-09 MED ORDER — CLONAZEPAM 1 MG PO TABS
1 MG | ORAL_TABLET | Freq: Two times a day (BID) | ORAL | 2 refills | Status: AC | PRN
Start: 2021-07-09 — End: 2021-08-08

## 2021-07-09 NOTE — Telephone Encounter (Signed)
Sent the clonazepam this AM.  She had Rx for the SSRIs (10 and 20 mg doses) sent at last visit for 6 months.  No worries regarding the appt - I recall our prior conversation and certainly understand.  Please ask her to check in if we can help in any way.

## 2021-07-09 NOTE — Telephone Encounter (Signed)
Reviewed SCRIPTS and sent.

## 2021-07-09 NOTE — Telephone Encounter (Signed)
REFILLS:    FLUOXITINE 20 MG, 1 QD, #30 WITH REFILLS    CLONAZEPAM 1 MG, 1 BID, #30 WITH REFILLS      [facility]      **SHE IS HAVING A LOT OF ISSUES WITH A VERY SICK DAUGHTER.  SHE WILL CALL BACK TO SCHEDULE AN APPOINTMENT WHEN SHE KNOWS HER DAUGHTER'S DOCTOR'S APPOINTMENTS SCHEDULE.

## 2021-08-09 ENCOUNTER — Ambulatory Visit: Admit: 2021-08-09 | Discharge: 2021-08-09 | Payer: MEDICAID | Attending: Family Medicine | Primary: Family Medicine

## 2021-08-09 DIAGNOSIS — F418 Other specified anxiety disorders: Secondary | ICD-10-CM

## 2021-08-09 MED ORDER — CLONAZEPAM 1 MG PO TABS
1 MG | ORAL_TABLET | Freq: Two times a day (BID) | ORAL | 2 refills | Status: DC | PRN
Start: 2021-08-09 — End: 2021-09-20

## 2021-08-09 MED ORDER — FLUOXETINE HCL 20 MG PO CAPS
20 MG | ORAL_CAPSULE | Freq: Every day | ORAL | 5 refills | Status: AC
Start: 2021-08-09 — End: 2021-09-20

## 2021-08-30 ENCOUNTER — Telehealth

## 2021-08-30 NOTE — Telephone Encounter (Signed)
Patient calls to report that her Clonazepam was stolen.  She made a police report and would like a new prescription.

## 2021-08-31 MED ORDER — CLONAZEPAM 0.5 MG PO TABS
0.5 MG | ORAL_TABLET | Freq: Three times a day (TID) | ORAL | 0 refills | Status: DC | PRN
Start: 2021-08-31 — End: 2021-09-20

## 2021-08-31 NOTE — Telephone Encounter (Signed)
I really can't do exactly that even with a police report.  She would have had a week's worth left - I sent a lower dose to the pharmacy (0.5 mg TID).   Insurance wouldn't cover the med and the pharmacy likely wouldn't fill.  I know she's in a difficult situation - hopefully this should hold over until her next refill.  However, this is the only time.
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
## 2021-08-31 NOTE — Addendum Note (Signed)
Addended by: Daivd Council E on: 08/31/2021 12:31 PM     Modules accepted: Orders

## 2021-09-05 NOTE — Telephone Encounter (Signed)
I just got this message this morning.  Patient called 6 days ago and it looks like Dr Kandee Keen responded 5 days ago if I am reading this right.    Was the patient contacted by any chance?  I am happy to call the patient and give patient Dr Konrad Saha message, I wanted to make sure that no-one else had spoken with the patient.    Let me know if I need to do anything.  Thanks [employer]

## 2021-09-05 NOTE — Telephone Encounter (Signed)
Noted  

## 2021-09-20 ENCOUNTER — Telehealth: Admit: 2021-09-20 | Discharge: 2021-09-20 | Payer: MEDICAID | Attending: Family Medicine | Primary: Family Medicine

## 2021-09-20 DIAGNOSIS — F418 Other specified anxiety disorders: Secondary | ICD-10-CM

## 2021-09-20 MED ORDER — FLUOXETINE HCL 20 MG PO CAPS
20 MG | ORAL_CAPSULE | Freq: Every day | ORAL | 5 refills | Status: DC
Start: 2021-09-20 — End: 2022-02-19

## 2021-09-20 MED ORDER — CLONAZEPAM 1 MG PO TABS
1 MG | ORAL_TABLET | Freq: Two times a day (BID) | ORAL | 2 refills | Status: AC | PRN
Start: 2021-09-20 — End: 2021-12-19

## 2021-09-20 MED ORDER — FLUOXETINE HCL 10 MG PO CAPS
10 MG | ORAL_CAPSULE | Freq: Every day | ORAL | 5 refills | Status: AC
Start: 2021-09-20 — End: 2022-02-19
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
## 2022-02-14 ENCOUNTER — Encounter: Payer: MEDICAID | Attending: Family Medicine | Primary: Family Medicine

## 2022-02-19 ENCOUNTER — Telehealth: Admit: 2022-02-19 | Discharge: 2022-02-19 | Payer: MEDICAID | Attending: Family Medicine | Primary: Family Medicine

## 2022-02-19 DIAGNOSIS — F418 Other specified anxiety disorders: Secondary | ICD-10-CM

## 2022-02-19 MED ORDER — FLUOXETINE HCL 20 MG PO CAPS
20 MG | ORAL_CAPSULE | Freq: Every day | ORAL | 5 refills | Status: DC
Start: 2022-02-19 — End: 2022-05-15

## 2022-02-19 MED ORDER — CLONAZEPAM 1 MG PO TABS
1 MG | ORAL_TABLET | Freq: Two times a day (BID) | ORAL | 2 refills | Status: DC | PRN
Start: 2022-02-19 — End: 2022-05-15

## 2022-02-19 MED ORDER — FLUOXETINE HCL 10 MG PO CAPS
10 MG | ORAL_CAPSULE | Freq: Every day | ORAL | 5 refills | Status: DC
Start: 2022-02-19 — End: 2022-05-15

## 2022-02-19 NOTE — Progress Notes (Signed)
[name] [name], was evaluated through a synchronous (real-time) audio-video encounter. The patient (or guardian if applicable) is aware that this is a billable service, which includes applicable co-pays. This Virtual Visit was conducted with patient's (and/or legal guardian's) consent. Patient identification was verified, and a caregiver was present when appropriate.   The patient was located at Home: [address]  [place] [address]  Provider was located at [facility] (Appt Dept): [address] [place],  [place] [address]      [name] [name] (DOB:  1985/05/01) is a Established patient, presenting virtually for evaluation of the following:    Assessment & Plan   Below is the assessment and plan developed based on review of pertinent history, physical exam, labs, studies, and medications.  1. Mixed anxiety and depressive disorder  -     FLUoxetine (PROZAC) 10 MG capsule; Take 1 capsule by mouth daily With the 20 mg for a total of 30 mg, Disp-30 capsule, R-5Normal  -     FLUoxetine (PROZAC) 20 MG capsule; Take 1 capsule by mouth daily With the 10 mg for a total of 30 mg, Disp-30 capsule, R-5Normal  -     clonazePAM (KLONOPIN) 1 MG tablet; Take 1 tablet by mouth 2 times daily as needed for Anxiety for up to 90 days. Max Daily Amount: 2 mg, Disp-60 tablet, R-2Normal  2. Anxiety  -     clonazePAM (KLONOPIN) 1 MG tablet; Take 1 tablet by mouth 2 times daily as needed for Anxiety for up to 90 days. Max Daily Amount: 2 mg, Disp-60 tablet, R-2Normal  Patient doing well.  Continue current medication.  No follow-ups on file.       Subjective   HPI patient presents virtually for a follow-up visit.  She states that she is doing well.  She has returned to the [place] area and is staying with her dad.  She and her daughter had some difficulty in [place] with support services, as her daughter is affected by autism.  She is happy to be back here where there is a good home school program.  She had some concerns  previously about where she was staying, but says that they are now in a safe and good place.  She has been doing well with her current medication and denies any side effect.    Review of Systems   Constitutional:  Negative for chills and fever.   Respiratory:  Negative for shortness of breath.    Cardiovascular:  Negative for chest pain.          Objective   Patient-Reported Vitals  No data recorded     Physical Exam  [INSTRUCTIONS:  "[x] " Indicates a positive item  "[] " Indicates a negative item  -- DELETE ALL ITEMS NOT EXAMINED]    Constitutional: [x]  Appears well-developed and well-nourished [x]  No apparent distress      []  Abnormal -     Mental status: [x]  Alert and awake  [x]  Oriented to person/place/time [x]  Able to follow commands    []  Abnormal -     Eyes:   EOM    [x]   Normal    []  Abnormal -   Sclera  [x]   Normal    []  Abnormal -          Discharge [x]   None visible   []  Abnormal -     HENT: [x]  Normocephalic, atraumatic  []  Abnormal -   [x]  Mouth/Throat: Mucous membranes are moist  External Ears [x]  Normal  []  Abnormal -    Neck: []  No visualized mass []  Abnormal -     Pulmonary/Chest: [x]  Respiratory effort normal   [x]  No visualized signs of difficulty breathing or respiratory distress        []  Abnormal -      Musculoskeletal:   []  Normal gait with no signs of ataxia         []  Normal range of motion of neck        []  Abnormal -     Neurological:        [x]  No Facial Asymmetry (Cranial nerve 7 motor function) (limited exam due to video visit)          []  No gaze palsy        []  Abnormal -          Skin:        []  No significant exanthematous lesions or discoloration noted on facial skin         []  Abnormal -            Psychiatric:       [x]  Normal Affect []  Abnormal -        []  No Hallucinations    Other pertinent observable physical exam findings:-             --Beola Cord, MD

## 2022-03-15 ENCOUNTER — Encounter

## 2022-04-04 NOTE — Telephone Encounter (Signed)
LMP- 01/05  + at home pregnancy test  Complications: emergency c-section with her 37 year old and was high risk  OTC-no  Pres- fluoxitine and Klonopin  Pren- no      Pt is scheduled for a NOB appt on 03/01 at 11:00am w/ Tiana Loft @ [facility] location. Please assist.

## 2022-04-05 NOTE — Telephone Encounter (Signed)
Called pt for nob triage- message left on vm tcb with any questions or concerns

## 2022-04-18 ENCOUNTER — Emergency Department: Payer: MEDICAID | Primary: Family Medicine

## 2022-04-18 ENCOUNTER — Inpatient Hospital Stay: Admit: 2022-04-18 | Discharge: 2022-04-18 | Disposition: A | Discharge status: Home / Self Care | Payer: MEDICAID

## 2022-04-18 ENCOUNTER — Emergency Department: Admit: 2022-04-18 | Payer: MEDICAID | Primary: Family Medicine

## 2022-04-18 DIAGNOSIS — O200 Threatened abortion: Secondary | ICD-10-CM

## 2022-04-18 LAB — CBC WITH AUTO DIFFERENTIAL
Absolute Baso #: 0.1 10*3/uL (ref 0.0–0.2)
Absolute Eos #: 0.1 10*3/uL (ref 0.0–0.5)
Absolute Lymph #: 2.7 10*3/uL (ref 1.0–3.2)
Absolute Mono #: 1.4 10*3/uL — ABNORMAL HIGH (ref 0.3–1.0)
Basophils %: 0.5 % (ref 0.0–2.0)
Eosinophils %: 0.4 % (ref 0.0–7.0)
Hematocrit: 39 % (ref 34.0–47.0)
Hemoglobin: 13.2 g/dL (ref 11.5–15.7)
Immature Grans (Abs): 0.12 10*3/uL — ABNORMAL HIGH (ref 0.00–0.06)
Immature Granulocytes: 0.5 % (ref 0.0–0.6)
Lymphocytes: 12.5 % — ABNORMAL LOW (ref 15.0–45.0)
MCH: 31.7 pg (ref 27.0–34.5)
MCHC: 33.8 g/dL (ref 32.0–36.0)
MCV: 93.8 fL (ref 81.0–99.0)
MPV: 9.4 fL (ref 7.2–13.2)
Monocytes: 6.2 % (ref 4.0–12.0)
NRBC Absolute: 0 10*3/uL (ref 0.000–0.012)
NRBC Automated: 0 % (ref 0.0–0.2)
Neutrophils %: 79.9 % — ABNORMAL HIGH (ref 42.0–74.0)
Neutrophils Absolute: 17.5 10*3/uL — ABNORMAL HIGH (ref 1.6–7.3)
Platelets: 347 10*3/uL (ref 140–440)
RBC: 4.16 x10e6/mcL (ref 3.60–5.20)
RDW: 12.2 % (ref 11.0–16.0)
WBC: 21.9 10*3/uL — ABNORMAL HIGH (ref 3.8–10.6)

## 2022-04-18 LAB — COMPREHENSIVE METABOLIC PANEL
ALT: 17 U/L (ref 0–35)
AST: 30 U/L (ref 0–35)
Albumin/Globulin Ratio: 1.5 (ref 1.00–2.70)
Albumin: 4.3 g/dL (ref 3.5–5.2)
Alk Phosphatase: 53 U/L (ref 35–117)
Anion Gap: 11 mmol/L (ref 2–17)
BUN: 14 mg/dL (ref 6–20)
CALCIUM,CORRECTED,CCA: 9.3 mg/dL (ref 8.6–10.0)
CO2: 22 mmol/L (ref 22–29)
Calcium: 9.5 mg/dL (ref 8.6–10.0)
Chloride: 102 mmol/L (ref 98–107)
Creatinine: 0.7 mg/dL (ref 0.5–1.0)
Est, Glom Filt Rate: 116 mL/min/1.73m (ref >=60)
Globulin: 2.8 g/dL (ref 1.9–4.4)
Glucose: 80 mg/dL (ref 70–99)
OSMOLALITY CALCULATED: 270 mOsm/kg (ref 270–287)
Potassium: 4.4 mmol/L (ref 3.5–5.3)
Sodium: 135 mmol/L (ref 135–145)
Total Bilirubin: <0.15 mg/dL (ref 0.00–1.20)
Total Protein: 7.1 g/dL (ref 6.4–8.3)

## 2022-04-18 LAB — HCG, QUANTITATIVE, PREGNANCY: hCG Quant: 15951 m[IU]/mL — ABNORMAL HIGH (ref 0.0–5.3)

## 2022-04-18 LAB — ABO/RH: ABO/Rh: B NEG

## 2022-04-18 LAB — LACTATE, SEPSIS: Lactate, Sepsis: 0.6 mmol/L (ref 0.5–2.0)

## 2022-04-18 MED ORDER — ONDANSETRON HCL 4 MG/2ML IJ SOLN
4 | Freq: Once | INTRAMUSCULAR | Status: AC
Start: 2022-04-18 — End: 2022-04-18
  Administered 2022-04-18: 22:00:00 4 mg via INTRAVENOUS

## 2022-04-18 MED ORDER — PRENATAL VITAMINS 28-0.8 MG PO TABS
28-0.8 MG | ORAL_TABLET | Freq: Every day | ORAL | 0 refills | Status: AC
Start: 2022-04-18 — End: 2023-01-29

## 2022-04-18 MED ORDER — MORPHINE SULFATE 4 MG/ML IV SOLN
4 | Freq: Once | INTRAVENOUS | Status: AC
Start: 2022-04-18 — End: 2022-04-18
  Administered 2022-04-18: 22:00:00 2 mg via INTRAVENOUS

## 2022-04-18 MED ORDER — SODIUM CHLORIDE 0.9 % IV SOLN (MINI-BAG)
0.9 | Freq: Once | INTRAVENOUS | Status: DC
Start: 2022-04-18 — End: 2022-04-18

## 2022-04-18 MED ORDER — CEFTRIAXONE SODIUM 1 G IJ SOLR
1 | Freq: Once | INTRAMUSCULAR | Status: AC
Start: 2022-04-18 — End: 2022-04-18

## 2022-04-18 MED ORDER — SODIUM CHLORIDE 0.9 % IV BOLUS
0.9 | Freq: Once | INTRAVENOUS | Status: AC
Start: 2022-04-18 — End: 2022-04-18
  Administered 2022-04-18: 22:00:00 1000 mL via INTRAVENOUS

## 2022-04-18 MED ORDER — ONDANSETRON HCL 4 MG PO TABS
4 | ORAL_TABLET | Freq: Four times a day (QID) | ORAL | 0 refills | Status: DC | PRN
Start: 2022-04-18 — End: 2022-10-13

## 2022-04-18 MED ORDER — NITROFURANTOIN MONOHYD MACRO 100 MG PO CAPS
100 MG | ORAL_CAPSULE | Freq: Two times a day (BID) | ORAL | 0 refills | Status: AC
Start: 2022-04-18 — End: 2022-04-25

## 2022-04-18 MED ORDER — CEFTRIAXONE SODIUM 1 G IJ SOLR
1 | INTRAMUSCULAR | Status: AC
Start: 2022-04-18 — End: 2022-04-18
  Administered 2022-04-18: 1000 via INTRAMUSCULAR

## 2022-04-18 MED FILL — ONDANSETRON HCL 4 MG/2ML IJ SOLN: 4 MG/2ML | INTRAMUSCULAR | Qty: 2

## 2022-04-18 MED FILL — CEFTRIAXONE SODIUM 1 G IJ SOLR: 1 g | INTRAMUSCULAR | Qty: 1000

## 2022-04-18 MED FILL — MORPHINE SULFATE (PF) 4 MG/ML IJ SOLN: 4 mg/mL | INTRAMUSCULAR | Qty: 1
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
## 2022-04-18 NOTE — Discharge Instructions (Addendum)
Please rest and take antibiotics as directed.  We will notify you if your cultures return positive and provide a change in treatment if this is indicated.  You will need to follow-up with your OB/GYN as soon as possible, please call tomorrow morning to obtain appointment for within 48 hours.  This is important as you may have infection and will need to have labs repeated as well as urinalysis obtained.  Return to the ED with any changes or concerns- increased cramping or pelvic pain, vaginal bleeding returns, fever, chills or any new symptoms.

## 2022-04-19 LAB — CULTURE, BLOOD 1

## 2022-04-21 ENCOUNTER — Inpatient Hospital Stay
Admit: 2022-04-21 | Discharge: 2022-04-21 | Disposition: A | Discharge status: Home / Self Care | Payer: MEDICAID | Attending: Emergency Medicine

## 2022-04-21 ENCOUNTER — Emergency Department: Admit: 2022-04-21 | Payer: MEDICAID | Primary: Family Medicine

## 2022-04-21 DIAGNOSIS — R079 Chest pain, unspecified: Secondary | ICD-10-CM

## 2022-04-21 DIAGNOSIS — O99891 Other specified diseases and conditions complicating pregnancy: Secondary | ICD-10-CM

## 2022-04-21 LAB — CBC WITH AUTO DIFFERENTIAL
Absolute Baso #: 0.1 10*3/uL (ref 0.0–0.2)
Absolute Eos #: 0.1 10*3/uL (ref 0.0–0.5)
Absolute Lymph #: 2.5 10*3/uL (ref 1.0–3.2)
Absolute Mono #: 1.4 10*3/uL — ABNORMAL HIGH (ref 0.3–1.0)
Basophils %: 0.4 % (ref 0.0–2.0)
Eosinophils %: 0.7 % (ref 0.0–7.0)
Hematocrit: 37.7 % (ref 34.0–47.0)
Hemoglobin: 12.8 g/dL (ref 11.5–15.7)
Immature Grans (Abs): 0.09 10*3/uL — ABNORMAL HIGH (ref 0.00–0.06)
Immature Granulocytes: 0.5 % (ref 0.0–0.6)
Lymphocytes: 12.8 % — ABNORMAL LOW (ref 15.0–45.0)
MCH: 31.4 pg (ref 27.0–34.5)
MCHC: 34 g/dL (ref 32.0–36.0)
MCV: 92.6 fL (ref 81.0–99.0)
MPV: 9.1 fL (ref 7.2–13.2)
Monocytes: 7.2 % (ref 4.0–12.0)
NRBC Absolute: 0 10*3/uL (ref 0.000–0.012)
NRBC Automated: 0 % (ref 0.0–0.2)
Neutrophils %: 78.4 % — ABNORMAL HIGH (ref 42.0–74.0)
Neutrophils Absolute: 15.5 10*3/uL — ABNORMAL HIGH (ref 1.6–7.3)
Platelets: 338 10*3/uL (ref 140–440)
RBC: 4.07 x10e6/mcL (ref 3.60–5.20)
RDW: 12.2 % (ref 11.0–16.0)
WBC: 19.8 10*3/uL — ABNORMAL HIGH (ref 3.8–10.6)

## 2022-04-21 LAB — COMPREHENSIVE METABOLIC PANEL
ALT: 16 U/L (ref 0–35)
AST: 21 U/L (ref 0–35)
Albumin/Globulin Ratio: 1.4 (ref 1.00–2.70)
Albumin: 4.2 g/dL (ref 3.5–5.2)
Alk Phosphatase: 53 U/L (ref 35–117)
Anion Gap: 11 mmol/L (ref 2–17)
BUN: 14 mg/dL (ref 6–20)
CALCIUM,CORRECTED,CCA: 9 mg/dL (ref 8.6–10.0)
CO2: 21 mmol/L — ABNORMAL LOW (ref 22–29)
Calcium: 9.5 mg/dL (ref 8.6–10.0)
Chloride: 103 mmol/L (ref 98–107)
Creatinine: 0.6 mg/dL (ref 0.5–1.0)
Est, Glom Filt Rate: 121 mL/min/1.73m (ref >=60)
Globulin: 2.9 g/dL (ref 1.9–4.4)
Glucose: 91 mg/dL (ref 70–99)
OSMOLALITY CALCULATED: 270 mOsm/kg (ref 270–287)
Potassium: 4.3 mmol/L (ref 3.5–5.3)
Sodium: 135 mmol/L (ref 135–145)
Total Bilirubin: <0.15 mg/dL (ref 0.00–1.20)
Total Protein: 7.1 g/dL (ref 6.4–8.3)

## 2022-04-21 LAB — COVID-19 & INFLUENZA COMBO (LIAT HOSPITAL)
INFLUENZA A: NOT DETECTED
INFLUENZA B: NOT DETECTED
SARS-CoV-2: NOT DETECTED

## 2022-04-21 LAB — CULTURE, URINE: FINAL REPORT: NO GROWTH

## 2022-04-21 LAB — URINALYSIS W/ RFLX MICROSCOPIC
Bilirubin Urine: NEGATIVE
Blood, Urine: NEGATIVE
Glucose, UA: NEGATIVE
Ketones, Urine: NEGATIVE mg/dL
Leukocyte Esterase, Urine: NEGATIVE
Nitrite, Urine: NEGATIVE
Protein, UA: NEGATIVE
Specific Gravity, UA: 1.024 (ref 1.003–1.035)
Urobilinogen, Urine: 0.2 EU/dL (ref >1.0)
pH, UA: 5.5 (ref 4.5–8.0)

## 2022-04-21 LAB — TROPONIN
Troponin, High Sensitivity: <6 ng/L (ref 0–14)
Troponin, High Sensitivity: 10 ng/L (ref 0–14)

## 2022-04-21 LAB — D-DIMER, QUANTITATIVE: D-Dimer, Quant: 0.34 CD:295178345 (ref 0.19–0.50)

## 2022-04-21 MED ORDER — ALUM & MAG HYDROXIDE-SIMETH 200-200-20 MG/5ML PO SUSP
200-200-20 | Freq: Once | ORAL | Status: AC
Start: 2022-04-21 — End: 2022-04-21

## 2022-04-21 MED ORDER — CLONAZEPAM 0.5 MG PO TABS
0.5 | Freq: Once | ORAL | Status: AC
Start: 2022-04-21 — End: 2022-04-21
  Administered 2022-04-21: 0.5 mg via ORAL

## 2022-04-21 MED ORDER — ONDANSETRON HCL 4 MG/2ML IJ SOLN
4 | Freq: Once | INTRAMUSCULAR | Status: AC
Start: 2022-04-21 — End: 2022-04-21
  Administered 2022-04-21: 21:00:00 4 mg via INTRAVENOUS

## 2022-04-21 MED ORDER — LIDOCAINE VISCOUS HCL 2 % MT SOLN
2 | OROMUCOSAL | Status: DC
Start: 2022-04-21 — End: 2022-04-21

## 2022-04-21 MED ORDER — ALUM & MAG HYDROXIDE-SIMETH 200-200-20 MG/5ML PO SUSP
200-200-20 | ORAL | Status: AC
Start: 2022-04-21 — End: 2022-04-21
  Administered 2022-04-21: 23:00:00 30 via ORAL

## 2022-04-21 MED ORDER — LIDOCAINE VISCOUS HCL 2 % MT SOLN
2 | Freq: Once | OROMUCOSAL | Status: AC
Start: 2022-04-21 — End: 2022-04-21
  Administered 2022-04-21: 23:00:00 15 mL via OROMUCOSAL

## 2022-04-21 MED FILL — CLONAZEPAM 0.5 MG PO TABS: 0.5 MG | ORAL | Qty: 1

## 2022-04-21 MED FILL — LIDOCAINE VISCOUS HCL 2 % MT SOLN: 2 % | OROMUCOSAL | Qty: 15

## 2022-04-21 MED FILL — ONDANSETRON HCL 4 MG/2ML IJ SOLN: 4 MG/2ML | INTRAMUSCULAR | Qty: 2

## 2022-04-21 MED FILL — MAG-AL PLUS 200-200-20 MG/5ML PO LIQD: 200-200-20 MG/5ML | ORAL | Qty: 30

## 2022-04-21 NOTE — ED Provider Notes (Signed)
[facility]  EMERGENCY DEPARTMENT ENCOUNTER      Pt Name: [name] [name]  MRN: [id number]  [place] 1985-03-11  Date of evaluation: 04/21/2022  Provider: Lilian Coma, MD    [place]       Chief Complaint   Patient presents with    Chest Pain     Patient brought via EMS for chest pain that began 1hr ago. Patient reports being [redacted]wks pregnant. Patient reports hot flashes, nausea, and overall body fatigue. Given 354m of ASA by EMS         HISTORY OF PRESENT ILLNESS    Patient is a 37year old female with history significant for anxiety who is currently [redacted] weeks pregnant who presents to the emergency department for chest pain.  She states 1 hour prior to arrival she had pain in the substernal chest which is sharp, exacerbated by inspiration, nonradiating.  She denies any pain to her back or arms.  She denies shortness of breath.  She denies congestion or cough.  She has been around people who are positive for COVID-19.  She took a dose of her Klonopin without relief.  She denies abdominal pain, lower extremity pain or swelling, previous history of DVT, vaginal bleeding.             Nursing Notes were reviewed.    REVIEW OF SYSTEMS     Review of Systems   All other systems reviewed and are negative.      Except as noted above the remainder of the review of systems was reviewed and negative.     PAST MEDICAL HISTORY     Past Medical History:   Diagnosis Date    Anxiety        SURGICAL HISTORY     No past surgical history on file.    CURRENT MEDICATIONS       Discharge Medication List as of 04/21/2022  6:33 PM        CONTINUE these medications which have NOT CHANGED    Details   ondansetron (ZOFRAN) 4 MG tablet Take 1 tablet by mouth every 6 hours as needed for Nausea or Vomiting, Disp-15 tablet, R-0Print      Prenatal Vit-Fe Fumarate-FA (PRENATAL VITAMINS) 28-0.8 MG TABS Take 1 tablet by mouth daily, Disp-30 tablet, R-0Print      ondansetron (ZOFRAN-ODT) 4 MG disintegrating tablet Historical Med       pantoprazole (PROTONIX) 40 MG tablet TAKE 1 TABLET BY MOUTH DAILY 30 MINUTES BEFORE BREAKFASTHistorical Med      !! FLUoxetine (PROZAC) 10 MG capsule Take 1 capsule by mouth daily With the 20 mg for a total of 30 mg, Disp-30 capsule, R-5Normal      !! FLUoxetine (PROZAC) 20 MG capsule Take 1 capsule by mouth daily With the 10 mg for a total of 30 mg, Disp-30 capsule, R-5Normal      clonazePAM (KLONOPIN) 1 MG tablet Take 1 tablet by mouth 2 times daily as needed for Anxiety for up to 90 days. Max Daily Amount: 2 mg, Disp-60 tablet, R-2Normal      ketoconazole (NIZORAL) 2 % cream Apply topically daily., Disp-30 g, R-1, Normal      Multiple Vitamin (MULTIVITAMIN ADULT PO) Take by mouth dailyHistorical Med       !! - Potential duplicate medications found. Please discuss with provider.          ALLERGIES     Doxycycline calcium, Levofloxacin, Acetaminophen, and Cephalexin    FAMILY HISTORY  Family History   Problem Relation Age of Onset    Breast Cancer Mother         SOCIAL HISTORY       Social History     Socioeconomic History    Marital status: Single   Tobacco Use    Smoking status: Former     Current packs/day: 0.00     Average packs/day: 0.5 packs/day for 5.0 years (2.5 ttl pk-yrs)     Types: Cigarettes     Start date: 12/2015     Quit date: 12/2020     Years since quitting: 1.3    Smokeless tobacco: Never   Vaping Use    Vaping Use: Never used   Substance and Sexual Activity    Alcohol use: Not Currently    Drug use: Never    Sexual activity: Not Currently       SCREENINGS         Glasgow Coma Scale  Eye Opening: Spontaneous  Best Verbal Response: Oriented  Best Motor Response: Obeys commands  Glasgow Coma Scale Score: 15                     CIWA Assessment  BP: 104/71  Pulse: 87                 PHYSICAL EXAM       ED Triage Vitals [04/21/22 1524]   BP Temp Temp Source Pulse Respirations SpO2 Height Weight - Scale   133/81 98.6 F (37 C) Oral 94 18 98 % 1.6 m (5' 3"$ ) 66.7 kg (147 lb)       Physical  Exam  Vitals and nursing note reviewed.   Constitutional:       Appearance: Normal appearance.   HENT:      Head: Normocephalic and atraumatic.      Nose: Nose normal. No congestion.      Mouth/Throat:      Mouth: Mucous membranes are moist.   Eyes:      Extraocular Movements: Extraocular movements intact.      Pupils: Pupils are equal, round, and reactive to light.   Cardiovascular:      Rate and Rhythm: Normal rate and regular rhythm.   Pulmonary:      Effort: Pulmonary effort is normal.      Breath sounds: Normal breath sounds.   Abdominal:      General: Abdomen is flat. There is no distension.      Palpations: Abdomen is soft.      Tenderness: There is no abdominal tenderness.   Musculoskeletal:         General: No swelling or deformity.      Cervical back: Normal range of motion and neck supple.   Skin:     General: Skin is warm and dry.   Neurological:      General: No focal deficit present.      Mental Status: She is alert and oriented to person, place, and time.   Psychiatric:      Comments: Very anxious         Procedures    DIAGNOSTIC RESULTS     EKG: All EKG's are interpreted by the Emergency Department Physician who either signs or Co-signs this chart in the absence of a cardiologist.    RADIOLOGY:   XR CHEST PORTABLE   Final Result      No evidence of acute cardiopulmonary disease.  LABS:  Labs Reviewed   CBC WITH AUTO DIFFERENTIAL - Abnormal; Notable for the following components:       Result Value    WBC 19.8 (*)     Neutrophils % 78.4 (*)     Lymphocytes 12.8 (*)     Neutrophils Absolute 15.5 (*)     Absolute Mono # 1.4 (*)     Immature Grans (Abs) 0.09 (*)     All other components within normal limits   COMPREHENSIVE METABOLIC PANEL - Abnormal; Notable for the following components:    CO2 21 (*)     All other components within normal limits   COVID-19 & INFLUENZA COMBO [facility])    Narrative:     Is this test for diagnosis or screening?->Diagnosis of ill patient  Symptomatic for  COVID-19 as defined by CDC?->Yes  Date of Symptom Onset->04/21/22  Hospitalized for COVID-19?->No  Admitted to ICU for COVID-19?->No  Pregnant?->Yes  Previously tested for COVID-19?->Yes   CULTURE, URINE   TROPONIN   D-DIMER, QUANTITATIVE   TROPONIN   URINALYSIS W/ RFLX MICROSCOPIC   D-DIMER, QUANTITATIVE   ADD ON LAB TEST       All other labs were within normal range or not returned as of this dictation.    EMERGENCY DEPARTMENT COURSE/REASSESSMENT and MDM:   MDM  Number of Diagnoses or Management Options  Anxiety state  Chest pain, unspecified type  Diagnosis management comments: EKG shows no evidence of ischemia.  Serial troponins are within normal range.  Patient has no hypoxia, no tachycardia, no signs or symptoms of DVT and her D-dimer is within normal range.  I highly doubt pulmonary embolism.  Likewise history is highly atypical for aortic dissection, no mediastinal widening on chest x-ray, no pulse or nerve deficit.  Patient is extremely anxious.  She was given a dose of her prescribed Klonopin in the emergency department.  Her abdomen is soft and nontender.  I highly doubt acute surgical process in her abdomen.  She had previously been prescribed Macrobid which would not be first choice in pregnancy.  There was no urinalysis done during previous visit.  She did have leukocytosis, which is still evident.  She had not yet filled this.  She was advised not to take this.  Her urinalysis is clear here.  Her COVID and influenza are negative.  Chest x-ray shows no pneumonia.  Unclear etiology of her leukocytosis.  She is afebrile.  She is nontoxic-appearing.  She was advised close follow-up for further assessment.          I reviewed available prior notes.  I ordered above listed tests.  I reviewed results of any tests ordered during this visit.       ED Course as of 04/21/22 1919   Sun Apr 21, 2022   1550 EKG Interpretation:  Rhythm: Sinus  Rate:  92  Axis:  Normal  Conduction:  Normal  ST-T Wave Morphology:   Non-specific  Ectopy:  None  ECG interpreted by me, in the absence of a cardiologist     [CZ]   1803 EKG Interpretation:  Rhythm: Sinus  Rate:  84  Axis:  Normal  Conduction:  Normal  ST-T Wave Morphology:  Non-specific  Ectopy:  None  ECG interpreted by me, in the absence of a cardiologist     [CZ]      ED Course User Index  [CZ] Lilian Coma, MD       FINAL IMPRESSION  1. Chest pain, unspecified type    2. Anxiety state          DISPOSITION/PLAN   DISPOSITION Decision To Discharge 04/21/2022 06:22:09 PM      PATIENT REFERRED TO:  Beola Cord, MD  [address]  [address]  [place] [address]  [phone]    Schedule an appointment as soon as possible for a visit       [facility]  [place]  [phone]    If symptoms worsen      DISCHARGE MEDICATIONS:  Discharge Medication List as of 04/21/2022  6:33 PM        Controlled Substances Monitoring:          No data to display                (Please note that portions of this note were completed with a voice recognition program.  Efforts were made to edit the dictations but occasionally words are mis-transcribed.)    Lilian Coma, MD (electronically signed)  Attending Emergency Physician        Lilian Coma, MD  04/21/22 1919

## 2022-04-21 NOTE — ED Notes (Signed)
Pt confirmed that she takes clonazepam as prescribed for anxiety

## 2022-04-23 LAB — CULTURE, BLOOD 1

## 2022-04-24 LAB — EKG 12-LEAD
P Axis: 77 degrees
P Axis: 78 degrees
P-R Interval: 130 ms
P-R Interval: 142 ms
Q-T Interval: 350 ms
Q-T Interval: 376 ms
QRS Duration: 66 ms
QRS Duration: 72 ms
QTc Calculation (Bazett): 400 ms
QTc Calculation (Bazett): 417 ms
R Axis: 85 degrees
R Axis: 96 degrees
T Axis: 66 degrees
T Axis: 68 degrees
Ventricular Rate: 84 {beats}/min
Ventricular Rate: 92 {beats}/min

## 2022-04-26 ENCOUNTER — Encounter: Attending: Surgical | Primary: Family Medicine

## 2022-04-26 DIAGNOSIS — Z3200 Encounter for pregnancy test, result unknown: Secondary | ICD-10-CM

## 2022-04-26 NOTE — Telephone Encounter (Signed)
SPOKE W/ THE PT AND ADV THAT PER THE PROVIDER, SHE CAN KEEP THE APPT FOR NEXT FRIDAY. PT STATED THAT SHE CANNOT COME IN ON THAT DAY AND ALSO HAS BEEN HAVING SOME DISCHARGE. TRANSFERRED TO THE PT TO TRIAGE NURSE MRS.CONNIE.

## 2022-04-26 NOTE — Telephone Encounter (Signed)
Pt was supposed to be seen for an NOB appt and ED f/u today at [facility]. Unfortunately, the 3rd party provider responsible for her ride cancelled and did not notify the patient. They were working to remedy that by sending her an Melburn Popper, but it would make the patient arrive later then the allotted 15 minute grace period. I was working with Redmond Baseman to determine if it were okay for the patient to still be seen today. Please assist.

## 2022-04-26 NOTE — Telephone Encounter (Signed)
Spoke with pt about her concerns- is having a yellowish vag d/c with itching, had a shot of Rocephin in ED recently. Advised could be yeast and can try Monistat 7. Reassured her that the u/s they did in ED showed an IUP 5 6/7 wks with a fetal heartbeat. She will keep appt for 05/03/22

## 2022-05-03 ENCOUNTER — Encounter: Admit: 2022-05-03 | Discharge: 2022-05-03 | Payer: MEDICAID | Attending: Surgical | Primary: Family Medicine

## 2022-05-03 ENCOUNTER — Ambulatory Visit: Admit: 2022-05-03 | Discharge: 2022-05-03 | Payer: MEDICAID | Primary: Family Medicine

## 2022-05-03 DIAGNOSIS — Z3201 Encounter for pregnancy test, result positive: Secondary | ICD-10-CM

## 2022-05-03 NOTE — Progress Notes (Incomplete)
Chief Complaint:  This 37 y.o. female presents today for pregnancy confirmation.    HPI: G2P1001 at [redacted]w[redacted]d. She complains of breast tenderness, nausea, and fatigue. No bleeding or cramping.    LMP:  Patient's last menstrual period was 03/11/2022.    OB History:    OB History   Gravida Para Term Preterm AB Living   2 1 1     1    SAB IAB Ectopic Molar Multiple Live Births             1      # Outcome Date GA Lbr Len/2nd Weight Sex Delivery Anes PTL Lv   2 Current            1 Term 2016    F CS-LTranv   LIV        GYN History:  STD History: Denies  Last pap:   Diagnosis:   Date Value Ref Range Status   09/23/2019 Comment  Final     Comment:     UNSATISFACTORY FOR EVALUATION.  SPECIMEN REPROCESSED FOR INTERPRETATION USING GLACIAL ACETIC ACID  (GAA).       Specimen adequacy:   Date Value Ref Range Status   09/23/2019 Comment  Final     Comment:     Specimen processed and examined, but unsatisfactory for evaluation of  epithelial abnormality because of insufficient squamous component. The  preparation consists almost entirely of red blood cells.       Methodology:   Date Value Ref Range Status   09/23/2019 CTIM       Comment:     The Thin Prep(R) Imager was unable to read this specimen.  Therefore  a manual review was performed.       HPV Aptima   Date Value Ref Range Status   09/23/2019 Negative Negative Final     Comment:     This nucleic acid amplification test detects fourteen high-risk  HPV types (16,18,31,33,35,39,45,51,52,56,58,59,66,68) without  differentiation.  PERFORMING LAB: [facility], [address], [place], [place] [id number], Phone - [phone], Director - MDNagendra  Unless Otherwise Notified, Test Performed At : [facility], , [place] , [id number] [phone]  [place], [place], [phone]         Allergies:    Allergies   Allergen Reactions   . Doxycycline Calcium Anaphylaxis   . Levofloxacin Anaphylaxis   . Acetaminophen      Other  reaction(s): Nausea   . Cephalexin      Other reaction(s): Nausea       Medication History:  Current Outpatient Medications   Medication Sig Dispense Refill   . ondansetron (ZOFRAN) 4 MG tablet Take 1 tablet by mouth every 6 hours as needed for Nausea or Vomiting 15 tablet 0   . Prenatal Vit-Fe Fumarate-FA (PRENATAL VITAMINS) 28-0.8 MG TABS Take 1 tablet by mouth daily 30 tablet 0   . ondansetron (ZOFRAN-ODT) 4 MG disintegrating tablet      . FLUoxetine (PROZAC) 10 MG capsule Take 1 capsule by mouth daily With the 20 mg for a total of 30 mg 30 capsule 5   . FLUoxetine (PROZAC) 20 MG capsule Take 1 capsule by mouth daily With the 10 mg for a total of 30 mg 30 capsule 5   . clonazePAM (KLONOPIN) 1 MG tablet Take 1 tablet by mouth 2 times daily as needed for Anxiety for up to 90 days. Max Daily Amount: 2 mg 60 tablet 2   .  pantoprazole (PROTONIX) 40 MG tablet TAKE 1 TABLET BY MOUTH DAILY 30 MINUTES BEFORE BREAKFAST (Patient not taking: Reported on 05/03/2022)     . ketoconazole (NIZORAL) 2 % cream Apply topically daily. (Patient not taking: Reported on 09/20/2021) 30 g 1   . Multiple Vitamin (MULTIVITAMIN ADULT PO) Take by mouth daily (Patient not taking: Reported on 05/03/2022)       No current facility-administered medications for this visit.       Past Medical History:  Past Medical History:   Diagnosis Date   . Anxiety      Patient Active Problem List   Diagnosis   . Abnormal uterine bleeding   . Acute low back pain with left-sided sciatica   . Anxiety   . Biliary dyskinesia   . Crohn's disease of large bowel (HCC)   . Generalized abdominal pain   . Irregular menstruation   . Left sided sciatica   . Menometrorrhagia   . Mixed anxiety and depressive disorder   . Nausea   . Panic attack   . Pelvic pain affecting pregnancy in first trimester, antepartum   . Polycystic ovary syndrome   . 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 Tooth infection        Past Surgical History:  History reviewed. No pertinent surgical history.    Social  History:  Social History     Socioeconomic History   . Marital status: Single     Spouse name: Not on file   . Number of children: Not on file   . Years of education: Not on file   . Highest education level: Not on file   Occupational History   . Not on file   Tobacco Use   . Smoking status: Former     Current packs/day: 0.00     Average packs/day: 0.5 packs/day for 5.0 years (2.5 ttl pk-yrs)     Types: Cigarettes     Start date: 12/2015     Quit date: 12/2020     Years since quitting: 1.4   . Smokeless tobacco: Never   Vaping Use   . Vaping Use: Never used   Substance and Sexual Activity   . Alcohol use: Not Currently   . Drug use: Never   . Sexual activity: Yes     Partners: Male   Other Topics Concern   . Not on file   Social History Narrative   . Not on file     Social Determinants of Health     Financial Resource Strain: Not on file   Food Insecurity: Not on file   Transportation Needs: Not on file   Physical Activity: Not on file   Stress: Not on file   Social Connections: Not on file   Intimate Partner Violence: Not on file   Housing Stability: Not on file       Family History:  Family History   Problem Relation Age of Onset   . Breast Cancer Mother      Denies family history of birth defects    OB History:    OB History   Gravida Para Term Preterm AB Living   2 1 1     1    SAB IAB Ectopic Molar Multiple Live Births             1      # Outcome Date GA Lbr Len/2nd Weight Sex Delivery Anes PTL Lv   2 Current  1 Term 2016    F CS-LTranv   LIV      Allergies:    Allergies   Allergen Reactions   . Doxycycline Calcium Anaphylaxis   . Levofloxacin Anaphylaxis   . Acetaminophen      Other reaction(s): Nausea   . Cephalexin      Other reaction(s): Nausea       ROS:  Review of Systems     BP 110/78   Ht 1.6 m (5\' 3" )   Wt 67.6 kg (149 lb)   LMP 03/11/2022   BMI 26.39 kg/m     Physical Exam:    OBGyn Exam     Test Results:     EARLY PREGNANCY ULTRASOUND:  A limited, bedside pelvic ultrasound was  performed using a transvaginal probe.  The medical necessity was to evaluate for signs of an intrauterine versus ectopic pregnancy.  The structures studied were the uterus and its contents, bladder, ovaries, vesicouterine space, and rectouterine space.    FINDINGS:  Evidence for an IUP {WAS/WAS [phone]::"was not"} seen.  The study was technically {Desc; adequate/inadequate:30688}.    CRL: ***  EDD by [place]: ***    The final Estimated Date of Confinement is: *** by US/LMP      Assessment/Plan:  1. Encounter for pregnancy test, result positive  -     US OB TRANSVAGINAL (CLINIC PERFORMED); Future  2. First trimester bleeding      Counseling:  Patient was oriented to practice. We discussed adverse effects of alcohol, breast vs formula feeding, childbirth classes, environment or work hazards, lifestyle changes, negative effects of smoking, physical activity, prenatal nutrition, dietary precautions, screening for chromosomal and genetic problems, Nuchal translucency, pros & cons of testing reviewed.     Genetic screening: Patient {ACCEPTED/DECLINED:30010736} screening with NIPT and NT scan    Discussed diet, exercise and recommended weight gain. All questions answered.     No follow-ups on file.

## 2022-05-09 LAB — PAP IG, CT-NG-TV, APTIMA HPV AND RFX 16/18,45 (199315)
.: 0
Chlamydia trachomatis, NAA: NEGATIVE
HPV Aptima: NEGATIVE
Neisseria Gonorrhoeae, NAA: NEGATIVE
Trichomonas Vaginalis by NAA: NEGATIVE

## 2022-05-13 ENCOUNTER — Ambulatory Visit: Admit: 2022-05-13 | Discharge: 2022-05-13 | Payer: MEDICAID | Attending: Family Medicine | Primary: Family Medicine

## 2022-05-13 DIAGNOSIS — F418 Other specified anxiety disorders: Secondary | ICD-10-CM

## 2022-05-13 NOTE — Progress Notes (Signed)
CHIEF COMPLAINT:  Chief Complaint   Patient presents with    Medication Check     Would like to discuss increasing Prozac feels like mood standstill now         HISTORY OF PRESENT ILLNESS:  [name] [name] is a 37 y.o. female  who presents for follow up on depression/ anxiety.  She has a history of this.  She has been doing well with current medicines, but feels like she might need an increase.    She adds that she has had a recent pregnancy but not planning to continue with this.  She is working, daughter is in school in program for special needs.      PHQ:      05/13/2022     1:08 PM   PHQ-9    Little interest or pleasure in doing things 0   Feeling down, depressed, or hopeless 0   Trouble falling or staying asleep, or sleeping too much 0   Feeling tired or having little energy 0   Poor appetite or overeating 0   Feeling bad about yourself - or that you are a failure or have let yourself or your family down 0   Trouble concentrating on things, such as reading the newspaper or watching television 0   Moving or speaking so slowly that other people could have noticed. Or the opposite - being so fidgety or restless that you have been moving around a lot more than usual 0   Thoughts that you would be better off dead, or of hurting yourself in some way 0   PHQ-2 Score 0   PHQ-9 Total Score 0   If you checked off any problems, how difficult have these problems made it for you to do your work, take care of things at home, or get along with other people? 0       CURRENT MEDICATION LIST:    Current Outpatient Medications   Medication Sig Dispense Refill    FLUoxetine (PROZAC) 10 MG capsule Take 1 capsule by mouth daily With the 20 mg for a total of 30 mg 30 capsule 5    FLUoxetine (PROZAC) 20 MG capsule Take 1 capsule by mouth daily With the 10 mg for a total of 30 mg 30 capsule 5    clonazePAM (KLONOPIN) 1 MG tablet Take 1 tablet by mouth 2 times daily as needed for Anxiety for up to 90 days. Max Daily Amount: 2 mg 60 tablet 2     ondansetron (ZOFRAN) 4 MG tablet Take 1 tablet by mouth every 6 hours as needed for Nausea or Vomiting 15 tablet 0    Prenatal Vit-Fe Fumarate-FA (PRENATAL VITAMINS) 28-0.8 MG TABS Take 1 tablet by mouth daily 30 tablet 0    ondansetron (ZOFRAN-ODT) 4 MG disintegrating tablet       Multiple Vitamin (MULTIVITAMIN ADULT PO) Take by mouth daily (Patient not taking: Reported on 05/03/2022)       No current facility-administered medications for this visit.        ALLERGIES:    Allergies   Allergen Reactions    Doxycycline Anaphylaxis    Doxycycline Calcium Anaphylaxis    Levofloxacin Anaphylaxis and Other (See Comments)    Acetaminophen      Other reaction(s): Nausea    Cephalexin      Other reaction(s): Nausea        HISTORY:  Past Medical History:   Diagnosis Date    Anxiety  History reviewed. No pertinent surgical history.   Social History     Socioeconomic History    Marital status: Single     Spouse name: Not on file    Number of children: Not on file    Years of education: Not on file    Highest education level: Not on file   Occupational History    Not on file   Tobacco Use    Smoking status: Former     Current packs/day: 0.00     Average packs/day: 0.5 packs/day for 5.0 years (2.5 ttl pk-yrs)     Types: Cigarettes     Start date: 12/2015     Quit date: 12/2020     Years since quitting: 1.4    Smokeless tobacco: Never   Vaping Use    Vaping Use: Never used   Substance and Sexual Activity    Alcohol use: Not Currently    Drug use: Never    Sexual activity: Yes     Partners: Male   Other Topics Concern    Not on file   Social History Narrative    Not on file     Social Determinants of Health     Financial Resource Strain: Not on file   Food Insecurity: Not on file   Transportation Needs: Not on file   Physical Activity: Not on file   Stress: Not on file   Social Connections: Not on file   Intimate Partner Violence: Not on file   Housing Stability: Not on file      Family History   Problem Relation Age of Onset     Breast Cancer Mother         REVIEW OF SYSTEMS:  Review of Systems   Constitutional:  Negative for chills and fever.   Respiratory:  Negative for cough.    Cardiovascular:  Negative for chest pain.        PHYSICAL EXAM:  Vital Signs -   Visit Vitals  BP 110/70 (Site: Left Upper Arm, Position: Sitting, Cuff Size: Medium Adult)   Pulse 74   Temp 97.8 F (36.6 C) (Oral)   Resp 16   Ht 1.6 m (5\' 3" )   Wt 70.9 kg (156 lb 3.2 oz)   SpO2 98%   BMI 27.67 kg/m        Physical Exam  Constitutional:       General: She is not in acute distress.  HENT:      Head: Normocephalic and atraumatic.   Cardiovascular:      Rate and Rhythm: Normal rate.   Neurological:      General: No focal deficit present.      Mental Status: She is alert.   Psychiatric:         Mood and Affect: Mood normal.            LABS  No results found for this visit on 05/13/22.  Routine Prenatal on 05/03/2022   Component Date Value Ref Range Status    Diagnosis: 05/03/2022 Comment   Final    NEGATIVE FOR INTRAEPITHELIAL LESION OR MALIGNANCY.    Specimen adequacy: 05/03/2022 Comment   Final    Comment: Satisfactory for evaluation. Endocervical and/or squamous metaplastic  cells (endocervical component) are present.      Clinician Provided ICD 05/03/2022 Comment   Final    Z32.01    Performed by: 05/03/2022 Comment   Final    Ranae Palms, Supervisory Cytotechnologist (ASCP)    .  05/03/2022 .   Final    Note: 05/03/2022 Comment   Final    Comment: The Pap smear is a screening test designed to aid in the detection of  premalignant and malignant conditions of the uterine cervix.  It is not a  diagnostic procedure and should not be used as the sole means of detecting  cervical cancer.  Both false-positive and false-negative reports do occur.      Methodology: 05/03/2022 Comment   Final    Comment: This liquid based ThinPrep(R) pap test was screened with the  use of an image guided system.      HPV Aptima 05/03/2022 Negative  Negative Final    Comment: This  nucleic acid amplification test detects fourteen high-risk  HPV types (16,18,31,33,35,39,45,51,52,56,58,59,66,68) without  differentiation.      HPV Genotype Reflex 05/03/2022 Comment   Final    Criteria not met, HPV Genotype not performed.    Chlamydia trachomatis, NAA 05/03/2022 Negative  Negative Final    Neisseria Gonorrhoeae, NAA 05/03/2022 Negative  Negative Final    Trichomonas Vaginalis by NAA 05/03/2022 Negative  Negative Final       IMPRESSION/PLAN    1. Mixed anxiety and depressive disorder     Continuing current medications - will increase klonopin.  Pt had recent pregnancy but reportedly ended as she felt she could not manage with current stressors and lack of support.      Follow up and Dispositions:  Return in about 4 weeks (around 06/10/2022), or if symptoms worsen or fail to improve.       Beola Cord, MD

## 2022-05-15 ENCOUNTER — Encounter

## 2022-05-15 MED ORDER — FLUOXETINE HCL 20 MG PO CAPS
20 MG | ORAL_CAPSULE | Freq: Every day | ORAL | 5 refills | Status: AC
Start: 2022-05-15 — End: 2022-09-18

## 2022-05-15 MED ORDER — CLONAZEPAM 1 MG PO TABS
1 MG | ORAL_TABLET | Freq: Two times a day (BID) | ORAL | 2 refills | Status: AC | PRN
Start: 2022-05-15 — End: 2022-08-13

## 2022-05-15 MED ORDER — FLUOXETINE HCL 10 MG PO CAPS
10 MG | ORAL_CAPSULE | Freq: Every day | ORAL | 5 refills | Status: AC
Start: 2022-05-15 — End: 2022-10-13

## 2022-05-15 NOTE — Telephone Encounter (Signed)
Please check with pt - she moved and was going to choose a pharmacy closer to her new place.

## 2022-05-15 NOTE — Telephone Encounter (Signed)
Pharmacy is correct    Requested Prescriptions     Pending Prescriptions Disp Refills    FLUoxetine (PROZAC) 10 MG capsule 30 capsule 5     Sig: Take 1 capsule by mouth daily With the 20 mg for a total of 30 mg    FLUoxetine (PROZAC) 20 MG capsule 30 capsule 5     Sig: Take 1 capsule by mouth daily With the 10 mg for a total of 30 mg    clonazePAM (KLONOPIN) 1 MG tablet 60 tablet 2     Sig: Take 1 tablet by mouth 2 times daily as needed for Anxiety for up to 90 days. Max Daily Amount: 2 mg

## 2022-05-18 ENCOUNTER — Encounter

## 2022-05-30 ENCOUNTER — Ambulatory Visit: Admit: 2022-05-30 | Discharge: 2022-05-30 | Payer: MEDICAID | Primary: Family Medicine

## 2022-05-30 ENCOUNTER — Encounter

## 2022-05-30 DIAGNOSIS — Z3682 Encounter for antenatal screening for nuchal translucency: Secondary | ICD-10-CM

## 2022-06-06 ENCOUNTER — Encounter: Attending: Surgical | Primary: Family Medicine

## 2022-06-17 ENCOUNTER — Encounter: Admit: 2022-06-17 | Discharge: 2022-06-17 | Payer: MEDICAID | Attending: Specialist | Primary: Family Medicine

## 2022-06-17 DIAGNOSIS — Z369 Encounter for antenatal screening, unspecified: Secondary | ICD-10-CM

## 2022-06-17 NOTE — Progress Notes (Signed)
37 yo G2P1 @ 14w 6 d (EDD 10/08/20240 rto for OB visit. She denies problems. NT was wnl but showed 8.4 cm submucosal fibroid. She has not had her PNL done. These were ordered. I discussed  AFP with the patient and ordered to be drawn after 4/20. I scheduled her for anatomy scan for next appt. FHTs today 145 LLQ. Uterus about 16 weeks size.  Of note , she is on klonopin and prozac bu PCP. She states she was on this during her first pregnancy and it was cleared by MFM.

## 2022-07-17 ENCOUNTER — Encounter: Primary: Family Medicine

## 2022-07-17 ENCOUNTER — Encounter: Attending: Surgical | Primary: Family Medicine

## 2022-08-05 ENCOUNTER — Encounter
Admit: 2022-08-05 | Discharge: 2022-08-05 | Payer: MEDICAID | Attending: Obstetrics & Gynecology | Primary: Family Medicine

## 2022-08-05 VITALS — BP 117/78 | Ht 63.0 in | Wt 179.6 lb

## 2022-08-05 DIAGNOSIS — Z3482 Encounter for supervision of other normal pregnancy, second trimester: Principal | ICD-10-CM

## 2022-08-05 MED ORDER — CLONAZEPAM 1 MG PO TABS
1 MG | ORAL_TABLET | Freq: Two times a day (BID) | ORAL | 0 refills | Status: AC | PRN
Start: 2022-08-05 — End: 2022-10-04

## 2022-08-05 MED ORDER — FLUOXETINE HCL 10 MG PO CAPS
10 MG | ORAL_CAPSULE | Freq: Every day | ORAL | 11 refills | Status: AC
Start: 2022-08-05 — End: ?

## 2022-08-05 NOTE — Progress Notes (Signed)
RH neg: rhogam 28wks  PNLS/unity today  Too late for afp  Previous c/s x1  Anxiety: on klonopin  Depression: prozac  Anatomy next visit

## 2022-08-05 NOTE — Addendum Note (Signed)
Addended by: Sabino Donovan F on: 08/05/2022 01:17 PM     Modules accepted: Orders

## 2022-08-21 ENCOUNTER — Encounter

## 2022-08-28 ENCOUNTER — Encounter: Primary: Family Medicine

## 2022-08-28 ENCOUNTER — Encounter: Attending: Obstetrics & Gynecology | Primary: Family Medicine

## 2022-09-18 ENCOUNTER — Encounter
Admit: 2022-09-18 | Discharge: 2022-09-18 | Payer: MEDICAID | Attending: Obstetrics & Gynecology | Primary: Family Medicine

## 2022-09-18 ENCOUNTER — Ambulatory Visit: Admit: 2022-09-18 | Discharge: 2022-09-18 | Payer: MEDICAID | Primary: Family Medicine

## 2022-09-18 ENCOUNTER — Encounter

## 2022-09-18 DIAGNOSIS — Z36 Encounter for antenatal screening for chromosomal anomalies: Secondary | ICD-10-CM

## 2022-09-18 DIAGNOSIS — Z3483 Encounter for supervision of other normal pregnancy, third trimester: Secondary | ICD-10-CM

## 2022-09-18 MED ORDER — FLUOXETINE HCL 20 MG PO CAPS
20 MG | ORAL_CAPSULE | Freq: Every day | ORAL | 5 refills | Status: AC
Start: 2022-09-18 — End: ?

## 2022-09-18 NOTE — Progress Notes (Signed)
RH neg: rhogam 28wks  PNLS/unity today  Too late for afp  Previous c/s x1  Anxiety: on klonopin  Depression: prozac  Anatomy wnl  IUGR: EFW 7.6%tile. AC 3.9%tile. normal dopplers. Dopplers weekly.   Lost to f/u: UDS. Not seen since 6/3

## 2022-09-19 ENCOUNTER — Telehealth

## 2022-09-19 MED ORDER — CLONAZEPAM 1 MG PO TABS
1 MG | ORAL_TABLET | Freq: Two times a day (BID) | ORAL | 2 refills | Status: AC | PRN
Start: 2022-09-19 — End: 2022-12-18

## 2022-09-19 MED ORDER — CLONAZEPAM 1 MG PO TABS
1 MG | ORAL_TABLET | Freq: Two times a day (BID) | ORAL | 0 refills | Status: AC | PRN
Start: 2022-09-19 — End: 2022-10-19

## 2022-09-19 NOTE — Addendum Note (Signed)
Addended by: Junius Finner on: 09/19/2022 04:33 PM     Modules accepted: Orders

## 2022-09-19 NOTE — Telephone Encounter (Signed)
Patent called back to follow up on the refill requesting stating that she spoke to the pharmacy and they still have not received it. Please assist.

## 2022-09-19 NOTE — Telephone Encounter (Signed)
Pt [redacted]w[redacted]d, prescription was not sent after visit yesterday 7/17    clonazePAM (KLONOPIN) 1 MG tablet    Pharmacy verified - [facility] on [address]

## 2022-09-19 NOTE — Telephone Encounter (Signed)
Rx sent 

## 2022-09-23 LAB — UNITY ANEUPLOIDY NIPT
Fetal Fraction: 8.6
Fetal Rh(D): DETECTED
Sex chromosome aneuploidy: NOT DETECTED

## 2022-09-24 ENCOUNTER — Encounter: Admit: 2022-09-24 | Discharge: 2022-09-24 | Payer: MEDICAID | Primary: Family Medicine

## 2022-09-24 ENCOUNTER — Encounter

## 2022-09-24 ENCOUNTER — Ambulatory Visit: Admit: 2022-09-24 | Discharge: 2022-09-24 | Payer: MEDICAID | Primary: Family Medicine

## 2022-09-24 DIAGNOSIS — O365930 Maternal care for other known or suspected poor fetal growth, third trimester, not applicable or unspecified: Secondary | ICD-10-CM

## 2022-09-24 MED ORDER — RHO D IMMUNE GLOBULIN 1500 UNITS IM SOSY
1500 | Freq: Once | INTRAMUSCULAR | Status: AC
Start: 2022-09-24 — End: 2022-09-24
  Administered 2022-09-24: 16:00:00 300 ug via INTRAMUSCULAR

## 2022-09-24 NOTE — Progress Notes (Signed)
RH neg: s/p rhogam  Unity normal, prenatals were not drawn last time. Rec getting them drawn today but patient declines due to not having time.   Recommend glucola next visit, states she was told she did not have to do glucola. Will have her discuss at next visit.   Too late for afp  Previous c/s x1 (desires VBAC, however expresses she is always rushed for visits and cannot discuss, risks not reviewed today and VBAC consent not given)  Anxiety: on klonopin  Depression: prozac  Anatomy wnl  IUGR: EFW 7.6%tile. AC 3.9%tile. normal dopplers. Dopplers weekly.   Lost to f/u: UDS (not done).   Delivery: Prefers Dr. Haskel Schroeder, wants to deliver at [facility].

## 2022-09-27 LAB — UNITY CARRIER SCREEN
Alpha-Thalassemia carrier screen: NEGATIVE
Cystic Fibrosis carrier screen: NEGATIVE
Sickle Cell Disease/Beta-Thalassemia/Hemoglobinopathies carrier screen: NEGATIVE
Spinal Muscular Atrophy carrier screen: NEGATIVE

## 2022-10-03 ENCOUNTER — Ambulatory Visit: Admit: 2022-10-03 | Discharge: 2022-10-03 | Payer: MEDICAID | Primary: Family Medicine

## 2022-10-03 ENCOUNTER — Encounter
Admit: 2022-10-03 | Discharge: 2022-10-03 | Payer: MEDICAID | Attending: Obstetrics & Gynecology | Primary: Family Medicine

## 2022-10-03 ENCOUNTER — Encounter

## 2022-10-03 DIAGNOSIS — F419 Anxiety disorder, unspecified: Secondary | ICD-10-CM

## 2022-10-03 DIAGNOSIS — O365930 Maternal care for other known or suspected poor fetal growth, third trimester, not applicable or unspecified: Secondary | ICD-10-CM

## 2022-10-03 MED ORDER — CLONAZEPAM 1 MG PO TABS
1 MG | ORAL_TABLET | Freq: Two times a day (BID) | ORAL | 0 refills | Status: AC
Start: 2022-10-03 — End: 2022-11-02

## 2022-10-03 NOTE — Progress Notes (Signed)
RH neg: rhogam 28wks  PNLS today  unity wnl  Too late for afp  Previous c/s x1  Anxiety: on klonopin refilled  Depression: prozac  Anatomy wnl  IUGR: EFW 7.6%tile. AC 3.9%tile. normal dopplers. Dopplers weekly.   Lost to f/u: UDS. Not seen since 6/3

## 2022-10-04 LAB — RUBELLA ANTIBODY, IGG
Rubella IgG Scr Interp: REACTIVE
Rubella IgG Scr: 58.1 IU/mL

## 2022-10-04 LAB — CBC
Hematocrit: 36.4 % (ref 34.0–47.0)
Hemoglobin: 12.6 g/dL (ref 11.5–15.7)
MCH: 32.6 pg (ref 27.0–34.5)
MCHC: 34.6 g/dL (ref 30.0–36.0)
MCV: 94.1 fL (ref 81.0–99.0)
MPV: 9.5 fL (ref 7.0–12.2)
NRBC Absolute: 0 10*3/uL (ref 0.000–0.012)
NRBC Automated: 0 % (ref 0.0–0.2)
Platelets: 332 10*3/uL (ref 140–440)
RBC: 3.87 x10e6/mcL (ref 3.60–5.20)
RDW: 12.6 % (ref 10.0–17.0)
WBC: 13.3 10*3/uL — ABNORMAL HIGH (ref 3.8–10.6)

## 2022-10-04 LAB — HIV SCREEN: HIV Screen: NEGATIVE

## 2022-10-04 LAB — HEPATITIS B SURFACE ANTIGEN: Hepatitis B Surface Ag: NEGATIVE

## 2022-10-04 LAB — HEMOGLOBIN A1C
Estimated Avg Glucose: 105
Estimated Avg Glucose: 111
Hemoglobin A1C: 5.3 % (ref 4.0–6.0)

## 2022-10-05 LAB — ANTIBODY SCREEN: Antibody Screen: POSITIVE

## 2022-10-05 LAB — HEPATITIS C AB, RFLX TO QT BY PCR: Hepatitis C Ab: NONREACTIVE

## 2022-10-05 LAB — HCV QN INTERP

## 2022-10-05 LAB — ABO/RH: ABO/Rh: B NEG

## 2022-10-05 LAB — ANTIBODY IDENTIFICATION

## 2022-10-06 LAB — T PALLIDUM SCREEN W/REFLEX: T. Pallidum Ab: NONREACTIVE

## 2022-10-07 NOTE — Telephone Encounter (Signed)
Spoke to tammy in blood back. Patient pos for anti d due to rhogam administration

## 2022-10-07 NOTE — Telephone Encounter (Signed)
Caller is requesting to speak to the nurse for this patient regarding critical results she has received.

## 2022-10-10 ENCOUNTER — Encounter

## 2022-10-10 ENCOUNTER — Encounter
Admit: 2022-10-10 | Discharge: 2022-10-10 | Payer: MEDICAID | Attending: Obstetrics & Gynecology | Primary: Family Medicine

## 2022-10-10 ENCOUNTER — Ambulatory Visit: Admit: 2022-10-10 | Discharge: 2022-10-10 | Payer: MEDICAID | Primary: Family Medicine

## 2022-10-10 DIAGNOSIS — Z3493 Encounter for supervision of normal pregnancy, unspecified, third trimester: Secondary | ICD-10-CM

## 2022-10-10 DIAGNOSIS — O365990 Maternal care for other known or suspected poor fetal growth, unspecified trimester, not applicable or unspecified: Secondary | ICD-10-CM

## 2022-10-10 LAB — GLUCOSE 1 HOUR POST GLUCOLA OB: GTT-OB 1hr: 118 mg/dL (ref 50–135)

## 2022-10-10 NOTE — Progress Notes (Addendum)
RH neg: s/p rogham @ 28 wks  PNLS wnl  unity wnl  Too late for afp  Previous c/s x1  Anxiety: on klonopin refilled  Depression: prozac  Anatomy wnl  IUGR: EFW 7.6%tile. AC 3.9%tile. normal dopplers. Dopplers weekly. Dopplers weekly  Lost to f/u: UDS. Not seen since 6/3  Dopplers normal. Growth with [place] next week

## 2022-10-13 ENCOUNTER — Emergency Department: Admit: 2022-10-13 | Payer: MEDICAID | Primary: Family Medicine

## 2022-10-13 ENCOUNTER — Inpatient Hospital Stay
Admit: 2022-10-13 | Discharge: 2022-10-13 | Disposition: A | Discharge status: Home / Self Care | Payer: MEDICAID | Admitting: Obstetrics & Gynecology

## 2022-10-13 ENCOUNTER — Observation Stay: Admit: 2022-10-13 | Payer: MEDICAID | Primary: Family Medicine

## 2022-10-13 DIAGNOSIS — K920 Hematemesis: Secondary | ICD-10-CM

## 2022-10-13 DIAGNOSIS — O99891 Other specified diseases and conditions complicating pregnancy: Secondary | ICD-10-CM

## 2022-10-13 LAB — CBC WITH AUTO DIFFERENTIAL
Basophils %: 0.1 % (ref 0.0–2.0)
Basophils Absolute: 0 10*3/uL (ref 0.0–0.2)
Eosinophils %: 1.3 % (ref 0.0–7.0)
Eosinophils Absolute: 0.2 10*3/uL (ref 0.0–0.5)
Hematocrit: 31.9 % — ABNORMAL LOW (ref 34.0–47.0)
Hemoglobin: 11 g/dL — ABNORMAL LOW (ref 11.5–15.7)
Immature Grans (Abs): 0.21 10*3/uL — ABNORMAL HIGH (ref 0.00–0.06)
Immature Granulocytes %: 1.5 % — ABNORMAL HIGH (ref 0.0–0.6)
Lymphocytes Absolute: 2.3 10*3/uL (ref 1.0–3.2)
Lymphocytes: 16.5 % (ref 15.0–45.0)
MCH: 33.2 pg (ref 27.0–34.5)
MCHC: 34.5 g/dL (ref 30.0–36.0)
MCV: 96.4 fL (ref 81.0–99.0)
MPV: 8.9 fL (ref 7.0–12.2)
Monocytes %: 7.5 % (ref 4.0–12.0)
Monocytes Absolute: 1 10*3/uL (ref 0.3–1.0)
Neutrophils %: 73.1 % (ref 42.0–74.0)
Neutrophils Absolute: 10.1 10*3/uL — ABNORMAL HIGH (ref 1.6–7.3)
Platelets: 270 10*3/uL (ref 140–440)
RBC: 3.31 x10e6/mcL — ABNORMAL LOW (ref 3.60–5.20)
RDW: 12.6 % (ref 10.0–17.0)
WBC: 13.8 10*3/uL — ABNORMAL HIGH (ref 3.8–10.6)

## 2022-10-13 LAB — COMPREHENSIVE METABOLIC PANEL
ALT: 13 U/L (ref 0–35)
AST: 21 U/L (ref 0–35)
Albumin/Globulin Ratio: 1 (ref 1.00–2.70)
Albumin: 3.3 g/dL — ABNORMAL LOW (ref 3.5–5.2)
Alk Phosphatase: 70 U/L (ref 35–117)
Anion Gap: 12 mmol/L (ref 2–17)
BUN: 8 mg/dL (ref 6–20)
CALCIUM,CORRECTED,CCA: 8.7 mg/dL (ref 8.5–10.7)
CO2: 19 mmol/L — ABNORMAL LOW (ref 22–29)
Calcium: 8.1 mg/dL — ABNORMAL LOW (ref 8.5–10.7)
Chloride: 105 mmol/L (ref 98–107)
Creatinine: 0.5 mg/dL (ref 0.5–1.0)
Est, Glom Filt Rate: 125 mL/min/1.73m (ref >=60)
Globulin: 2.6 g/dL (ref 1.9–4.4)
Glucose: 87 mg/dL (ref 70–99)
Osmolaliy Calculated: 270 mOsm/kg (ref 270–287)
Potassium: 4 mmol/L (ref 3.5–5.3)
Sodium: 136 mmol/L (ref 135–145)
Total Bilirubin: 0.17 mg/dL (ref 0.00–1.20)
Total Protein: 5.9 g/dL (ref 5.7–8.3)

## 2022-10-13 LAB — EKG 12-LEAD
P Axis: 69 degrees
P-R Interval: 117 ms
Q-T Interval: 403 ms
QRS Duration: 78 ms
QTc Calculation (Bazett): 434 ms
R Axis: 67 degrees
T Axis: 27 degrees
Ventricular Rate: 69 {beats}/min

## 2022-10-13 LAB — FETAL FIBRONECTIN: Fetal Fibronectin: NEGATIVE

## 2022-10-13 LAB — AMNIOTIC FLUID PROTEIN: Protein, Amniotic Fluid: NEGATIVE

## 2022-10-13 LAB — PROTIME-INR
INR: 1 — ABNORMAL LOW (ref 1.5–3.5)
Protime: 13.3 seconds (ref 11.6–14.5)

## 2022-10-13 LAB — LIPASE: Lipase: 39 U/L (ref 13–60)

## 2022-10-13 LAB — HEMOGLOBIN AND HEMATOCRIT
Hematocrit: 34.6 % (ref 34.0–47.0)
Hemoglobin: 11.6 g/dL (ref 11.5–15.7)

## 2022-10-13 LAB — PTT: APTT: 26.4 seconds (ref 23.3–34.5)

## 2022-10-13 MED ORDER — LACTATED RINGERS IV SOLN
INTRAVENOUS | Status: DC
Start: 2022-10-13 — End: 2022-10-13
  Administered 2022-10-13: 19:00:00 via INTRAVENOUS

## 2022-10-13 MED ORDER — ONDANSETRON HCL 4 MG/2ML IJ SOLN
4 | Freq: Once | INTRAMUSCULAR | Status: AC
Start: 2022-10-13 — End: 2022-10-13

## 2022-10-13 MED ORDER — SOD CITRATE-CITRIC ACID 500-334 MG/5ML PO SOLN
500-334 | Freq: Once | ORAL | Status: AC
Start: 2022-10-13 — End: 2022-10-13

## 2022-10-13 MED ORDER — ONDANSETRON 4 MG PO TBDP
4 MG | ORAL_TABLET | Freq: Three times a day (TID) | ORAL | 3 refills | Status: DC | PRN
Start: 2022-10-13 — End: 2022-12-06

## 2022-10-13 MED ORDER — IOPAMIDOL 76 % IV SOLN
76 | Freq: Once | INTRAVENOUS | Status: AC | PRN
Start: 2022-10-13 — End: 2022-10-13
  Administered 2022-10-13: 19:00:00 100 mL via INTRAVENOUS

## 2022-10-13 MED ORDER — LIDOCAINE VISCOUS HCL 2 % MT SOLN
2 | OROMUCOSAL | 3 refills | Status: AC | PRN
Start: 2022-10-13 — End: ?

## 2022-10-13 MED ORDER — SUCRALFATE 1 G PO TABS
1 | ORAL_TABLET | Freq: Two times a day (BID) | ORAL | 1 refills | Status: AC
Start: 2022-10-13 — End: ?

## 2022-10-13 MED ORDER — FAMOTIDINE 40 MG PO TABS
40 MG | ORAL_TABLET | Freq: Every evening | ORAL | 3 refills | Status: AC
Start: 2022-10-13 — End: 2023-01-29

## 2022-10-13 MED ORDER — ALUM & MAG HYDROXIDE-SIMETH 200-200-20 MG/5ML PO SUSP
200-200-20 | Freq: Four times a day (QID) | ORAL | Status: DC | PRN
Start: 2022-10-13 — End: 2022-10-13

## 2022-10-13 MED ORDER — ALUM & MAG HYDROXIDE-SIMETH 200-200-20 MG/5ML PO SUSP
200-200-20 MG/5ML | Freq: Four times a day (QID) | ORAL | 2 refills | Status: DC | PRN
Start: 2022-10-13 — End: 2022-12-06

## 2022-10-13 MED ORDER — ONDANSETRON HCL 4 MG/2ML IJ SOLN
4 | INTRAMUSCULAR | Status: AC
Start: 2022-10-13 — End: 2022-10-13
  Administered 2022-10-13: 17:00:00 4 via INTRAVENOUS

## 2022-10-13 MED ORDER — ONDANSETRON 4 MG PO TBDP
4 | Freq: Three times a day (TID) | ORAL | Status: DC | PRN
Start: 2022-10-13 — End: 2022-10-13
  Administered 2022-10-13: 21:00:00 4 mg via ORAL

## 2022-10-13 MED ORDER — SODIUM CHLORIDE 0.9 % IV BOLUS
0.9 | Freq: Once | INTRAVENOUS | Status: AC
Start: 2022-10-13 — End: 2022-10-13
  Administered 2022-10-13: 17:00:00 1000 mL via INTRAVENOUS

## 2022-10-13 MED ORDER — LIDOCAINE VISCOUS HCL 2 % MT SOLN
2 | OROMUCOSAL | Status: DC | PRN
Start: 2022-10-13 — End: 2022-10-13
  Administered 2022-10-13: 19:00:00 15 mL via OROMUCOSAL

## 2022-10-13 MED ORDER — SUCRALFATE 1 G PO TABS
1 | Freq: Two times a day (BID) | ORAL | Status: DC
Start: 2022-10-13 — End: 2022-10-13
  Administered 2022-10-13: 18:00:00 1 g via ORAL

## 2022-10-13 MED ORDER — SOD CITRATE-CITRIC ACID 500-334 MG/5ML PO SOLN
500-334 | ORAL | Status: AC
Start: 2022-10-13 — End: 2022-10-13
  Administered 2022-10-13: 17:00:00 30 via ORAL

## 2022-10-13 MED ORDER — PANTOPRAZOLE SODIUM 40 MG IV SOLR
40 MG | Freq: Once | INTRAVENOUS | Status: AC
Start: 2022-10-13 — End: 2022-10-13
  Administered 2022-10-13: 17:00:00 40 mg via INTRAVENOUS

## 2022-10-13 MED FILL — ONDANSETRON HCL 4 MG/2ML IJ SOLN: 4 MG/2ML | INTRAMUSCULAR | Qty: 2

## 2022-10-13 MED FILL — ONDANSETRON 4 MG PO TBDP: 4 MG | ORAL | Qty: 1

## 2022-10-13 MED FILL — SUCRALFATE 1 G PO TABS: 1 GM | ORAL | Qty: 1

## 2022-10-13 MED FILL — SOD CITRATE-CITRIC ACID 500-334 MG/5ML PO SOLN: 500-334 MG/5ML | ORAL | Qty: 30

## 2022-10-13 MED FILL — PROTONIX 40 MG IV SOLR: 40 MG | INTRAVENOUS | Qty: 40

## 2022-10-13 MED FILL — LIDOCAINE VISCOUS HCL 2 % MT SOLN: 2 % | OROMUCOSAL | Qty: 15

## 2022-10-13 NOTE — Progress Notes (Signed)
Pt currently being assessed in main ED for vomiting up blood. Per nurse, when pt moved off of EMS stretcher, the stretcher was wet. Pt reports she did not feel any gush of fluid and is unsure what it looked like. Pt denies significant contractions, but reports abdominal discomfort. Pt endorses good fetal movement and denies vaginal bleeding. Pt denies recent intercourse.   Fetal fibronectin and ROM + obtained per order.

## 2022-10-13 NOTE — Progress Notes (Signed)
Pt arrived after ED evaluation for throwing up blood, per ED RN. Pt medically cleared and coming up for fetal monitoring.  Pt c/o significant tenderness mid upper abd area/esophageal area. Pt c/o new onset hoarseness in her voice. Dr Cherylin Mylar ordered bicitra and protonix. No relief from protonix. Bicitra given and pt immediately turned red in the face, started crying, pt stating it burns. Stopped administration of bicitra. Pt now stating the sharpness in her chest/throat has increased and is now constant.  Dr Cherylin Mylar ordered carafate. Pt had difficulty swallowing but did manage to take it. Pt wants to hold off on liquid lidocaine at this time.  Pt down for CT at this time.

## 2022-10-13 NOTE — ED Provider Notes (Signed)
[facility] EMERGENCY DEPT  EMERGENCY DEPARTMENT ENCOUNTER      Pt Name: [name] [name]  MRN: [id number]  Birthdate 25-Feb-1986  Date of evaluation: 10/13/2022  Provider: Charolett Bumpers, MD  12:37 PM    CHIEF COMPLAINT       Chief Complaint   Patient presents with    Hematemesis     Pt biba for c/o hematemesis, pt also c/o stomach tightness near epigastric area. G2P1. [redacted] WEEKS pregnant, noted moisture on ems cot. Pt does not think she urinated.denies any cramping or contractions. Having weekly doppler exams for baby being underweight. Dr Haskel Schroeder is OB/GYN.         HISTORY OF PRESENT ILLNESS    [name] [name] is a 37 y.o. female who presents to the emergency department for concerns of an episode of hematemesis.  Has a history of Crohn's disease.  She is also [redacted] weeks pregnant.  She feels tightness in her epigastric area.  EMS and nursing staff noticed clear fluid on the EMS cot.  She does not think that she urinated.  No abdominal contractions.  No cramping.  Symptoms started about 40 minutes prior to arrival and she has not been feeling the baby move since then.  No nausea.  She is not on any blood thinners.  No diarrhea.  No other symptoms endorsed.    HPI    Nursing Notes were reviewed.    REVIEW OF SYSTEMS       Review of Systems   All other systems reviewed and are negative.      Except as noted above the remainder of the review of systems was reviewed and negative.       PAST MEDICAL HISTORY     Past Medical History:   Diagnosis Date    Anxiety          SURGICAL HISTORY     History reviewed. No pertinent surgical history.      CURRENT MEDICATIONS       Previous Medications    CLONAZEPAM (KLONOPIN) 1 MG TABLET    Take 1 tablet by mouth 2 times daily as needed for Anxiety for up to 60 days. Max Daily Amount: 2 mg    CLONAZEPAM (KLONOPIN) 1 MG TABLET    Take 1 tablet by mouth 2 times daily as needed for Anxiety for up to 90 days. Max Daily Amount: 2 mg    CLONAZEPAM (KLONOPIN) 1 MG TABLET    Take 1 tablet by mouth 2  times daily as needed for Anxiety for up to 30 days. Max Daily Amount: 2 mg    CLONAZEPAM (KLONOPIN) 1 MG TABLET    Take 1 tablet by mouth in the morning and at bedtime for 30 days. Max Daily Amount: 2 mg    FLUOXETINE (PROZAC) 10 MG CAPSULE    Take 1 capsule by mouth daily With the 20 mg for a total of 30 mg    FLUOXETINE (PROZAC) 10 MG CAPSULE    Take 3 capsules by mouth daily Please give 20mg  tablet and 10 mg tablet for total of 30mg     FLUOXETINE (PROZAC) 20 MG CAPSULE    Take 1 capsule by mouth daily With the 10 mg for a total of 30 mg    MULTIPLE VITAMIN (MULTIVITAMIN ADULT PO)    Take by mouth daily    ONDANSETRON (ZOFRAN) 4 MG TABLET    Take 1 tablet by mouth every 6 hours as needed for Nausea or Vomiting  ONDANSETRON (ZOFRAN-ODT) 4 MG DISINTEGRATING TABLET        PRENATAL VIT-FE FUMARATE-FA (PRENATAL VITAMINS) 28-0.8 MG TABS    Take 1 tablet by mouth daily       ALLERGIES     Doxycycline, Doxycycline calcium, Levofloxacin, and Acetaminophen    FAMILY HISTORY       Family History   Problem Relation Age of Onset    Breast Cancer Mother           SOCIAL HISTORY       Social History     Socioeconomic History    Marital status: Single     Spouse name: None    Number of children: None    Years of education: None    Highest education level: None   Tobacco Use    Smoking status: Former     Current packs/day: 0.00     Average packs/day: 0.5 packs/day for 5.0 years (2.5 ttl pk-yrs)     Types: Cigarettes     Start date: 12/03/2015     Quit date: 12/2020     Years since quitting: 1.8    Smokeless tobacco: Never   Vaping Use    Vaping status: Never Used   Substance and Sexual Activity    Alcohol use: Not Currently    Drug use: Never    Sexual activity: Yes     Partners: Male       SCREENINGS         Glasgow Coma Scale  Eye Opening: Spontaneous  Best Verbal Response: Oriented  Best Motor Response: Obeys commands  Glasgow Coma Scale Score: 15                     CIWA Assessment  BP: 96/65  Pulse: 73                  PHYSICAL EXAM       ED Triage Vitals [10/13/22 1059]   BP Systolic BP Percentile Diastolic BP Percentile Temp Temp Source Pulse Respirations SpO2   113/70 -- -- 98.1 F (36.7 C) Oral 71 20 97 %      Height Weight - Scale         1.6 m (5\' 3" ) 85.7 kg (189 lb)             Physical Exam  Vitals and nursing note reviewed.   Constitutional:       General: She is not in acute distress.     Appearance: Normal appearance.   HENT:      Head: Normocephalic and atraumatic.      Right Ear: External ear normal.      Left Ear: External ear normal.      Nose: Nose normal.      Mouth/Throat:      Mouth: Mucous membranes are moist.   Eyes:      Extraocular Movements: Extraocular movements intact.      Pupils: Pupils are equal, round, and reactive to light.   Cardiovascular:      Rate and Rhythm: Normal rate and regular rhythm.      Pulses: Normal pulses.      Heart sounds: Normal heart sounds.   Pulmonary:      Effort: Pulmonary effort is normal. No respiratory distress.      Breath sounds: Normal breath sounds.   Abdominal:      General: Abdomen is flat. Bowel sounds are normal. There is distension.  Palpations: Abdomen is soft.      Tenderness: There is no abdominal tenderness.   Musculoskeletal:         General: No swelling or tenderness. Normal range of motion.      Cervical back: Normal range of motion and neck supple. No tenderness.   Skin:     General: Skin is warm and dry.      Capillary Refill: Capillary refill takes less than 2 seconds.   Neurological:      General: No focal deficit present.      Mental Status: She is alert and oriented to person, place, and time. Mental status is at baseline.   Psychiatric:         Mood and Affect: Mood normal.         Behavior: Behavior normal.         Thought Content: Thought content normal.         DIAGNOSTIC RESULTS     EKG: All EKG's are interpreted by the Emergency Department Physician who either signs or Co-signs this chart in the absence of a  cardiologist.        RADIOLOGY:   Non-plain film images such as CT, Ultrasound and MRI are read by the radiologist. Plain radiographic images are visualized and preliminarily interpreted by the emergency physician with the below findings:        Interpretation per the Radiologist below, if available at the time of this note:    US OB 1 OR MORE FETUS LIMITED   Final Result   Single live intrauterine pregnancy.            ED BEDSIDE ULTRASOUND:   Performed by ED Physician - none    LABS:  Labs Reviewed   CBC WITH AUTO DIFFERENTIAL - Abnormal; Notable for the following components:       Result Value    WBC 13.8 (*)     RBC 3.31 (*)     Hemoglobin 11.0 (*)     Hematocrit 31.9 (*)     Neutrophils Absolute 10.1 (*)     Immature Granulocytes % 1.5 (*)     Immature Grans (Abs) 0.21 (*)     All other components within normal limits   COMPREHENSIVE METABOLIC PANEL - Abnormal; Notable for the following components:    CO2 19 (*)     Calcium 8.1 (*)     Albumin 3.3 (*)     All other components within normal limits   LIPASE   FETAL FIBRONECTIN   AMNIOTIC FLUID PROTEIN       All other labs were within normal range or not returned as of this dictation.    EMERGENCY DEPARTMENT COURSE and DIFFERENTIAL DIAGNOSIS/MDM:   Vitals:    Vitals:    10/13/22 1059 10/13/22 1215 10/13/22 1220   BP: 113/70 (!) 88/52 96/65   Pulse: 71 60 73   Resp: 20  21   Temp: 98.1 F (36.7 C)     TempSrc: Oral     SpO2: 97% 99% 100%   Weight: 85.7 kg (189 lb)     Height: 1.6 m (5\' 3" )             Medical Decision Making  37 year old female who is [redacted] weeks pregnant presents for concerns of epigastric abdominal pain along with episodes of hematemesis.  States that she cannot feel the baby move anymore.  She is hemodynamically stable.  Has a history of Crohn's.  Initially she had no  vomiting here.  Did have an episode of hematemesis however.  Blood count is 11.  Slightly lower than prior.  Discussed with Dr. Delena Bali.  She is recommending an ultrasound.  This  is normal.  Will reach out to her GI doctor. Discussed with Dr Jake Michaelis who explains he would not perform an endoscopy on this patient given that she is pregnant and hemodynamically stable otherwise.  He recommends treating her as if she has an ulcer with PPIs twice daily. Rediscussed with Dr Cherylin Mylar who will observe in labor and delivery.     Amount and/or Complexity of Data Reviewed  Labs: ordered.  Radiology: ordered.    Risk  Prescription drug management.  Decision regarding hospitalization.            REASSESSMENT          CRITICAL CARE TIME       FINAL IMPRESSION      1. Hematemesis with nausea    2. Abdominal pain during pregnancy in third trimester          DISPOSITION/PLAN   DISPOSITION        PATIENT REFERRED TO:  No follow-up provider specified.    DISCHARGE MEDICATIONS:  New Prescriptions    No medications on file     Controlled Substances Monitoring:          No data to display                (Please note that portions of this note were completed with a voice recognition program.  Efforts were made to edit the dictations but occasionally words are mis-transcribed.)    Charolett Bumpers, MD (electronically signed)  Attending Emergency Physician            Deneane Stifter, Salli Quarry, MD  10/13/22 1241

## 2022-10-13 NOTE — H&P (Signed)
HISTORY AND PHYSICAL             Date: 10/13/2022        Patient Name: [name] [name]     Date of Birth: 07-20-1985      Age:  37 y.o.    Chief Complaint     Chief Complaint   Patient presents with    Hematemesis     Pt biba for c/o hematemesis, pt also c/o stomach tightness near epigastric area. G2P1. [redacted] WEEKS pregnant, noted moisture on ems cot. Pt does not think she urinated.denies any cramping or contractions. Having weekly doppler exams for baby being underweight. Dr Haskel Schroeder is OB/GYN.          History Obtained From   patient    History of Present Illness   37yo G2P1001 @ [redacted]w[redacted]d who presented to the ER with c/o vomiting blood.    Labs in ED demonstrated a slight drop in hgb from previous level, but otherwise were unremarkable. MD reported patient not feeling baby move for 40 minutes. OB US requested and was normal. There was concern for possible PROM due to fluid on the gurney patient came in on. L&D RN collected Amnisure and FFN which were negative.     Upon arrival to L&D, patient c/o severe pain in her chest, throat, and upper abdomen. She describes episode this morning as suddenly vomiting up blood clots. She has taken zofran for nausea in pregnancy, but did not feel nauseous just prior to vomiting. She feels fatigued and light headed. She has new hoarseness. She denies prior h/o heartburn.     Past Medical History     Past Medical History:   Diagnosis Date    Anxiety         Past Surgical History   History reviewed. No pertinent surgical history.     Medications Prior to Admission     Prior to Admission medications    Medication Sig Start Date End Date Taking? Authorizing Provider   aluminum & magnesium hydroxide-simethicone (MAALOX) 200-200-20 MG/5ML SUSP suspension Take 30 mLs by mouth every 6 hours as needed for Indigestion 10/13/22  Yes Rumeal Cullipher, Lucita Lora, MD   ondansetron (ZOFRAN-ODT) 4 MG disintegrating tablet Place 1 tablet under the tongue every 8 hours as needed for Nausea or Vomiting 10/13/22  Yes  Oleg Oleson, Lucita Lora, MD   lidocaine viscous hcl (XYLOCAINE) 2 % SOLN solution Take 15 mLs by mouth every 3 hours as needed for Irritation 10/13/22  Yes Kaylynne Andres, Lucita Lora, MD   sucralfate (CARAFATE) 1 GM tablet Take 1 tablet by mouth every 12 hours Administer on an empty stomach. Do not administer antacids within 30 minutes of administration of sucralfate. Do not crush sucralfate tablets. Tablets may be cut in half to help facilitate oral intake. 10/14/22  Yes Birdena Jubilee, MD   famotidine (PEPCID) 40 MG tablet Take 1 tablet by mouth every evening 10/13/22  Yes Parthena Fergeson, Lucita Lora, MD   clonazePAM (KLONOPIN) 1 MG tablet Take 1 tablet by mouth in the morning and at bedtime for 30 days. Max Daily Amount: 2 mg 10/03/22 11/02/22  Junius Finner, MD   FLUoxetine (PROZAC) 20 MG capsule Take 1 capsule by mouth daily With the 10 mg for a total of 30 mg 09/18/22   Junius Finner, MD   FLUoxetine (PROZAC) 10 MG capsule Take 3 capsules by mouth daily Please give 20mg  tablet and 10 mg tablet for total of 30mg  08/05/22   Junius Finner,  MD   Prenatal Vit-Fe Fumarate-FA (PRENATAL VITAMINS) 28-0.8 MG TABS Take 1 tablet by mouth daily 04/18/22   Georgette Shell, PA-C        Allergies   Doxycycline, Doxycycline calcium, Levofloxacin, and Acetaminophen    Social History     Social History       Tobacco History       Smoking Status  Former Smoking Start Date  12/03/2015 Quit Date  12/2020 Average Packs/Day  0.5 packs/day for 5.0 years (2.5 ttl pk-yrs) Smoking Tobacco Type  Cigarettes from 12/03/2015 to 12/2020   Pack Year History     Packs/Day From To Years    0 12/2020  1.9    0.5 12/03/2015 12/2020 5.0      Smokeless Tobacco Use  Never              Alcohol History       Alcohol Use Status  Not Currently Drinks/Week  0 Glasses of wine, 0 Cans of beer, 0 Shots of liquor, 0 Drinks containing 0.5 oz of alcohol per week              Drug Use       Drug Use Status  Never              Sexual Activity        Sexually Active  Yes Partners  Female                    Family History     Family History   Problem Relation Age of Onset    Breast Cancer Mother        Review of Systems   Review of Systems  Constitutional:  Negative for chills and fever.   HENT: Negative.     Eyes: Negative.    Respiratory: Negative.  Negative for cough and shortness of breath.    Cardiovascular:  Negative for chest pain, palpitations and leg swelling.   Gastrointestinal:  see HPI.   Endocrine: Negative for cold intolerance and heat intolerance.   Genitourinary:  Negative for difficulty urinating, dysuria, flank pain, frequency, hematuria and pelvic pain.   Musculoskeletal: Negative.    Psychiatric/Behavioral: Negative.       Physical Exam   BP 106/63   Pulse 71   Temp 98.1 F (36.7 C) (Oral)   Resp 12   Ht 1.6 m (5\' 3" )   Wt 85.7 kg (189 lb)   LMP 03/11/2022   SpO2 99%   BMI 33.48 kg/m     Physical Exam  Constitutional:       Appearance: Normal appearance. Appears uncomfortable wincing in pain.   HENT:      Head: Normocephalic and atraumatic.   Cardiovascular:      Rate and Rhythm: Normal rate. '  Respiratory:      Gen: unlabored respirations  Abdominal:      General: gravid, no fundal tenderness; epigastric tenderness with slight guarding  Neurological:      Mental Status: She is alert.   Psychiatric:         Mood and Affect: Mood normal.         Behavior: Behavior normal.      Labs      Recent Results (from the past 24 hour(s))   CBC with Diff    Collection Time: 10/13/22 11:20 AM   Result Value Ref Range    WBC 13.8 (H) 3.8 - 10.6 x10e3/mcL    RBC  3.31 (L) 3.60 - 5.20 x10e6/mcL    Hemoglobin 11.0 (L) 11.5 - 15.7 g/dL    Hematocrit 57.8 (L) 34.0 - 47.0 %    MCV 96.4 81.0 - 99.0 fL    MCH 33.2 27.0 - 34.5 pg    MCHC 34.5 30.0 - 36.0 g/dL    RDW 46.9 62.9 - 52.8 %    Platelets 270 140 - 440 x10e3/mcL    MPV 8.9 7.0 - 12.2 fL    Neutrophils % 73.1 42.0 - 74.0 %    Lymphocytes 16.5 15.0 - 45.0 %    Monocytes % 7.5 4.0 - 12.0 %     Eosinophils % 1.3 0.0 - 7.0 %    Basophils % 0.1 0.0 - 2.0 %    Neutrophils Absolute 10.1 (H) 1.6 - 7.3 x10e3/mcL    Lymphocytes Absolute 2.3 1.0 - 3.2 x10e3/mcL    Monocytes Absolute 1.0 0.3 - 1.0 x10e3/mcL    Eosinophils Absolute 0.2 0.0 - 0.5 x10e3/mcL    Basophils Absolute 0.0 0.0 - 0.2 x10e3/mcL    Immature Granulocytes % 1.5 (H) 0.0 - 0.6 %    Immature Grans (Abs) 0.21 (H) 0.00 - 0.06 x10e3/mcL   CMP    Collection Time: 10/13/22 11:20 AM   Result Value Ref Range    Sodium 136 135 - 145 mmol/L    Potassium 4.0 3.5 - 5.3 mmol/L    Chloride 105 98 - 107 mmol/L    CO2 19 (L) 22 - 29 mmol/L    Glucose 87 70 - 99 mg/dL    BUN 8 6 - 20 mg/dL    Creatinine 0.5 0.5 - 1.0 mg/dL    Anion Gap 12 2 - 17 mmol/L    Osmolaliy Calculated 270 270 - 287 mOsm/kg    Calcium 8.1 (L) 8.5 - 10.7 mg/dL    CALCIUM,CORRECTED,CCA 8.7 8.5 - 10.7 mg/dL    Total Protein 5.9 5.7 - 8.3 g/dL    Albumin 3.3 (L) 3.5 - 5.2 g/dL    Globulin 2.6 1.9 - 4.4 g/dL    Albumin/Globulin Ratio 1.00 1.00 - 2.70    Total Bilirubin 0.17 0.00 - 1.20 mg/dL    Alk Phosphatase 70 35 - 117 unit/L    AST 21 0 - 35 unit/L    ALT 13 0 - 35 unit/L    Est, Glom Filt Rate 125 >=60 mL/min/1.64m   Lipase    Collection Time: 10/13/22 11:20 AM   Result Value Ref Range    Lipase 39 13 - 60 unit/L   Fetal Fibronectin    Collection Time: 10/13/22 11:30 AM   Result Value Ref Range    Fetal Fibronectin Negative    Amniotic Fluid Protein    Collection Time: 10/13/22 11:30 AM   Result Value Ref Range    Protein, Amniotic Fluid Negative    Hemoglobin and Hematocrit    Collection Time: 10/13/22  1:25 PM   Result Value Ref Range    Hemoglobin 11.6 11.5 - 15.7 g/dL    Hematocrit 41.3 24.4 - 47.0 %   EKG 12 Lead    Collection Time: 10/13/22  1:40 PM   Result Value Ref Range    Ventricular Rate 69 BPM    P-R Interval 117 ms    QRS Duration 78 ms    Q-T Interval 403 ms    QTc Calculation (Bazett) 434 ms    P Axis 69 degrees    R Axis 67 degrees  T Axis 27 degrees    Diagnosis        SINUS RHYTHM WITH SHORT PR INTERVAL  BORDERLINE ECG  UNCONFIRMED REPORT    Confirmed by Tressa Busman, Scott ([id number]) on 10/13/2022 4:25:05 PM     Protime-INR    Collection Time: 10/13/22  1:48 PM   Result Value Ref Range    Protime 13.3 11.6 - 14.5 seconds    INR 1.0 (L) 1.5 - 3.5   APTT    Collection Time: 10/13/22  1:48 PM   Result Value Ref Range    APTT 26.4 23.3 - 34.5 seconds        Imaging/Diagnostics Last 24 Hours   US OB 1 OR MORE FETUS LIMITED    Result Date: 10/13/2022  OB ultrasound, limited: 10/13/22 INDICATION: abd pain COMPARISON: 10/10/2022 TECHNIQUE: Transabdominal imaging grayscale and M-mode imaging of the gravid uterus for limited fetal evaluation. (This is not a formal anatomic survey.) FINDINGS: Single live IUP present. No evident fetal anatomic abnormality on given images. Heart rate: 137bpm Fetal position: vertex Placental location: anterior Amniotic fluid: normal, AFI is 16.6 cm Cervix is closed measuring 4.7 cm in length. Nonvisualized ovaries bilaterally.     Single live intrauterine pregnancy.      Assessment      Hospital Problems             Last Modified POA    * (Principal) Abdominal pain affecting pregnancy 10/13/2022 Yes   [redacted] weeks gestation  Hematemesis  Concern for rupture of membranes, rupture not found  Gastritis, esophagitis, GERD     Plan   [redacted]w[redacted]d  FHT normal  [place] reassuring with closed and long cervix, normal amniotic fluid  FFN negative  Slight decrease in hgb/hct -> rec workup with potential GI consult, endoscopy, or CT scan to eval for potential GI bleed   Amnisure test - negative    Consultations Ordered:  None    Electronically signed by Birdena Jubilee, MD on 10/13/22 at 12:15 PM EDT    Update:  - Patient was sent from the ED to L&D for FHR monitoring after being cleared from the ED with plan for pepcid BID per GI rec to ED physician.  - Patient with BP ranging from 88-113/52-70.   - Pepcid IV and 1L IVF given. Attempted to give bictra, but patient reported worsening burning  and pain when she started drinking it. Maalox ordered instead.   - EKG due to chest pain: NSR    - GI records reviewed: patient had EGD/colonoscopy on 03/13/22 with findings of normal appearing duodenum, erthyemma . Biopsies included stomach biopsy c/w chronic gastritis and mild foveolar hyperplasia. A CT enterograph was ordered, but patient states she was unaware and then pregnancy was confirmed the following month.     - Due to persistent and worsening abdominal pain with mild hypotension, decision was made to proceed with CT scan. Patient is aware of risks of radiation and agrees with plan to proceed with CT.    -CTA abdomen pelvis (GI bleed protocol):   Aorta: The abdominal aorta is normal in caliber. No acute aortic dissection.  GI: No CT evidence of acute disease. Radiodense material within the fundus of   the stomach which is present on precontrast imaging and may be radiopaque   medication.   Mesenteric Arteries, Renal Arteries, Pelvic Arteries: Patent.  Lungs: No focal consolidation.  Heart: No pericardial effusion.  Liver: Mildly enlarged measuring 19cm in craniocaudal dimension.  Gallbladder: Morphologically normal.  Spleen, Pancreas, Adrenals: Normal  Kidneys/Ureters: Mild right & Trace left hydronephrosis likely secondary to gravid   uterus.. No obstructing urinary tract stone.  Urinary bladder: Unremarkable  Reproductive organs: Gravid uterus with fetus in vertex position.  Colon: No obstruction or inflammation.  Appendix: Normal  Stomach: Unremarkable  Small bowel: No obstruction.  Peritoneum: No ascites. No pneumoperitoneum.  Lymph nodes: No pathologically enlarged lymph nodes.  Soft tissues: Unremarkable  Bones: No suspicious lytic or blastic lesions.  IMPRESSION: No CT evidence of acute GI bleed.     - patient received viscous lidocaine and carafate after returning from scan. She initially had burning with lidocaine but was noticeably more comfortable after an hour. Burning and pain had improved  significantly.    - reviewed negative CT findings and recommended continuing to treat as severe acid reflux and esophagitis/gastritis. Rx for maalox, pepcid, carafate, and viscous lidocaine sent to pharmacy. Patient instructed to f/u with her GI physician this week. She has an OB appt this week as well.     Patient expresses understanding and is comfortable with discharge home.     Carita Pian, MD  [facility] Hospitalist

## 2022-10-17 ENCOUNTER — Encounter
Admit: 2022-10-17 | Discharge: 2022-10-17 | Payer: MEDICAID | Attending: Obstetrics & Gynecology | Primary: Family Medicine

## 2022-10-17 ENCOUNTER — Encounter: Admit: 2022-10-17 | Discharge: 2022-10-17 | Payer: MEDICAID | Primary: Family Medicine

## 2022-10-17 DIAGNOSIS — O365930 Maternal care for other known or suspected poor fetal growth, third trimester, not applicable or unspecified: Secondary | ICD-10-CM

## 2022-10-17 DIAGNOSIS — Z3493 Encounter for supervision of normal pregnancy, unspecified, third trimester: Secondary | ICD-10-CM

## 2022-10-17 NOTE — Progress Notes (Addendum)
RH neg: s/p rogham @ 28 wks  PNLS wnl  unity wnl  Too late for afp  Previous c/s x1  Anxiety: on klonopin refilled  Depression: prozac  Anatomy wnl  IUGR: EFW now 21%tile. Will continue weekly dopplers   Lost to f/u: UDS. Not seen since 6/3  Tdap today

## 2022-10-24 ENCOUNTER — Ambulatory Visit: Admit: 2022-10-24 | Discharge: 2022-10-24 | Payer: MEDICAID | Primary: Family Medicine

## 2022-10-24 ENCOUNTER — Encounter
Admit: 2022-10-24 | Discharge: 2022-10-24 | Payer: MEDICAID | Attending: Obstetrics & Gynecology | Primary: Family Medicine

## 2022-10-24 DIAGNOSIS — O365930 Maternal care for other known or suspected poor fetal growth, third trimester, not applicable or unspecified: Secondary | ICD-10-CM

## 2022-10-24 NOTE — Progress Notes (Signed)
RH neg: s/p rogham @ 28 wks  PNLS wnl  unity wnl  Too late for afp  Previous c/s x1  Anxiety: on klonopin refilled  Depression: prozac  Anatomy wnl  IUGR: EFW now 21%tile. BPP wnl  Uterine window: delivery @ 39wks. No more plan for TOLAC. Continue to monitor possible uterine window  Tdap s/p

## 2022-10-31 ENCOUNTER — Encounter
Admit: 2022-10-31 | Discharge: 2022-10-31 | Payer: MEDICAID | Attending: Obstetrics & Gynecology | Primary: Family Medicine

## 2022-10-31 ENCOUNTER — Ambulatory Visit: Admit: 2022-10-31 | Discharge: 2022-10-31 | Payer: MEDICAID | Primary: Family Medicine

## 2022-10-31 VITALS — BP 112/70 | Wt 193.0 lb

## 2022-10-31 DIAGNOSIS — O34211 Maternal care for low transverse scar from previous cesarean delivery: Secondary | ICD-10-CM

## 2022-10-31 DIAGNOSIS — O365930 Maternal care for other known or suspected poor fetal growth, third trimester, not applicable or unspecified: Secondary | ICD-10-CM

## 2022-10-31 MED ORDER — CLONAZEPAM 1 MG PO TABS
1 | ORAL_TABLET | Freq: Two times a day (BID) | ORAL | 0 refills | Status: DC
Start: 2022-10-31 — End: 2022-12-06

## 2022-10-31 MED ORDER — FLUOXETINE HCL 10 MG PO CAPS
10 | ORAL_CAPSULE | Freq: Every day | ORAL | 0 refills | Status: DC
Start: 2022-10-31 — End: 2022-12-06

## 2022-10-31 NOTE — Telephone Encounter (Signed)
Patient scheduled for repeat c/s. Pre op instructions given. Patient aware of date, time, location, arrival time. Labor precautions reviewed. Patient verbalizes understanding and gratitude       DOS 12/04/2022 39.1 WEEKS 1230PM 1030AM ARRIVAL

## 2022-10-31 NOTE — Progress Notes (Signed)
 ROB P1 34w  OFA  pt  Prev cs x1. Uterine window  Bpp 8/8  Refilled  prozac and klonipin 42m suppy   -pt states she waiting to hear of iol date, I spoke w treena to sched

## 2022-10-31 NOTE — Telephone Encounter (Signed)
 SCHEDULE REPEAT C/S AT 39 WEEKS IUGR

## 2022-11-07 ENCOUNTER — Encounter
Admit: 2022-11-07 | Discharge: 2022-11-07 | Payer: MEDICAID | Attending: Obstetrics & Gynecology | Primary: Family Medicine

## 2022-11-07 ENCOUNTER — Encounter

## 2022-11-07 ENCOUNTER — Ambulatory Visit: Admit: 2022-11-07 | Discharge: 2022-11-07 | Payer: MEDICAID | Primary: Family Medicine

## 2022-11-07 VITALS — BP 112/76 | Ht 62.99 in | Wt 195.4 lb

## 2022-11-07 DIAGNOSIS — O365931 Maternal care for other known or suspected poor fetal growth, third trimester, fetus 1: Secondary | ICD-10-CM

## 2022-11-07 NOTE — Progress Notes (Signed)
RH neg: s/p rogham @ 28 wks  PNLS wnl  unity wnl  Too late for afp  Previous c/s x1  Anxiety: on klonopin refilled  Depression: prozac  Anatomy wnl  IUGR: EFW now 21%tile. BPP wnl  Uterine window: delivery @ 39wks. No more plan for TOLAC. Continue to monitor possible uterine window  Tdap s/p

## 2022-11-15 ENCOUNTER — Emergency Department: Admit: 2022-11-15 | Payer: MEDICAID | Primary: Family Medicine

## 2022-11-15 ENCOUNTER — Encounter

## 2022-11-15 ENCOUNTER — Inpatient Hospital Stay
Admission: EM | Admit: 2022-11-15 | Discharge: 2022-11-18 | Disposition: A | Discharge status: Home / Self Care | Payer: MEDICAID | Source: Other Acute Inpatient Hospital | Admitting: Obstetrics & Gynecology

## 2022-11-15 ENCOUNTER — Ambulatory Visit: Admit: 2022-11-15 | Discharge: 2022-11-15 | Payer: MEDICAID | Primary: Family Medicine

## 2022-11-15 ENCOUNTER — Encounter
Admit: 2022-11-15 | Discharge: 2022-11-15 | Payer: MEDICAID | Attending: Student in an Organized Health Care Education/Training Program | Primary: Family Medicine

## 2022-11-15 VITALS — BP 110/68 | Ht 62.99 in | Wt 199.0 lb

## 2022-11-15 DIAGNOSIS — O365931 Maternal care for other known or suspected poor fetal growth, third trimester, fetus 1: Secondary | ICD-10-CM

## 2022-11-15 DIAGNOSIS — R0602 Shortness of breath: Secondary | ICD-10-CM

## 2022-11-15 DIAGNOSIS — Z3483 Encounter for supervision of other normal pregnancy, third trimester: Secondary | ICD-10-CM

## 2022-11-15 DIAGNOSIS — O99513 Diseases of the respiratory system complicating pregnancy, third trimester: Principal | ICD-10-CM

## 2022-11-15 LAB — COVID-19, FLU A/B, AND RSV COMBO
INFLU A, RAPID, FLUA: NOT DETECTED
INFLU B, RAPID, FLUB: NOT DETECTED
RSV By PCR: NOT DETECTED
SARS-CoV-2, Rapid: NOT DETECTED

## 2022-11-15 LAB — CBC WITH AUTO DIFFERENTIAL
Basophils %: 0.3 % (ref 0.0–2.0)
Basophils Absolute: 0 10*3/uL (ref 0.0–0.2)
Eosinophils %: 2.5 % (ref 0.0–7.0)
Eosinophils Absolute: 0.3 10*3/uL (ref 0.0–0.5)
Hematocrit: 36.7 % (ref 34.0–47.0)
Hemoglobin: 12.5 g/dL (ref 11.5–15.7)
Immature Grans (Abs): 0.11 10*3/uL — ABNORMAL HIGH (ref 0.00–0.06)
Immature Granulocytes %: 1.1 % — ABNORMAL HIGH (ref 0.0–0.6)
Lymphocytes Absolute: 1.9 10*3/uL (ref 1.0–3.2)
Lymphocytes: 18.6 % (ref 15.0–45.0)
MCH: 32.7 pg (ref 27.0–34.5)
MCHC: 34.1 g/dL (ref 30.0–36.0)
MCV: 96.1 fL (ref 81.0–99.0)
MPV: 8.9 fL (ref 7.0–12.2)
Monocytes %: 8.5 % (ref 4.0–12.0)
Monocytes Absolute: 0.9 10*3/uL (ref 0.3–1.0)
Neutrophils %: 69 % (ref 42.0–74.0)
Neutrophils Absolute: 7.1 10*3/uL (ref 1.6–7.3)
Platelets: 280 10*3/uL (ref 140–440)
RBC: 3.82 x10e6/mcL (ref 3.60–5.20)
RDW: 12.4 % (ref 10.0–17.0)
WBC: 10.4 10*3/uL (ref 3.8–10.6)

## 2022-11-15 LAB — COMPREHENSIVE METABOLIC PANEL
ALT: 25 U/L (ref 0–35)
AST: 27 U/L (ref 0–35)
Albumin/Globulin Ratio: 1 (ref 1.00–2.70)
Albumin: 3.5 g/dL (ref 3.5–5.2)
Alk Phosphatase: 131 U/L — ABNORMAL HIGH (ref 35–117)
Anion Gap: 11 mmol/L (ref 2–17)
BUN: 5 mg/dL — ABNORMAL LOW (ref 6–20)
CO2: 22 mmol/L (ref 22–29)
Calcium: 8.8 mg/dL (ref 8.5–10.7)
Chloride: 107 mmol/L (ref 98–107)
Creatinine: 0.6 mg/dL (ref 0.5–1.0)
Est, Glom Filt Rate: 120 mL/min/1.73mÂ² (ref >=60)
Globulin: 2.7 g/dL (ref 1.9–4.4)
Glucose: 83 mg/dL (ref 70–99)
Osmolaliy Calculated: 276 mosm/kg (ref 270–287)
Potassium: 4.5 mmol/L (ref 3.5–5.3)
Sodium: 140 mmol/L (ref 135–145)
Total Bilirubin: <0.15 mg/dL (ref 0.00–1.20)
Total Protein: 6.3 g/dL (ref 5.7–8.3)

## 2022-11-15 LAB — RESPIRATORY PANEL, MOLECULAR
Adenovirus: NOT DETECTED
Bordetella Parapertussis: NOT DETECTED
Bordetella Pertussis: NOT DETECTED
Chlamydia Pneumoniae: NOT DETECTED
Coronavirus 299E: NOT DETECTED
Coronavirus HKU1: NOT DETECTED
Coronavirus NL63: NOT DETECTED
Coronavirus OC43: NOT DETECTED
Human Metapneumovirus: NOT DETECTED
Human Rhinovirus/Enterovirus: DETECTED — AB
Influenza A: NOT DETECTED
Influenza B: NOT DETECTED
Mycoplasma pneumoniae: NOT DETECTED
Parainfluenza 1: NOT DETECTED
Parainfluenza 2: NOT DETECTED
Parainfluenza 3: NOT DETECTED
Parainfluenza: NOT DETECTED
Respiratory Syncytial Virus: NOT DETECTED
SARS-CoV-2: NOT DETECTED

## 2022-11-15 LAB — ECHO (TTE) LIMITED (PRN CONTRAST/BUBBLE/STRAIN/3D)
Body Surface Area: 2 m2
EF BP: 79 % (ref 55–100)
Fractional Shortening 2D: 43 % (ref 28–44)
IVSd: 1 cm — AB (ref 0.6–0.9)
LV EDV A2C: 44 mL
LV EDV A4C: 86 mL
LV EDV Index A2C: 23 mL/m2
LV EDV Index A4C: 45 mL/m2
LV ESV A2C: 14 mL
LV ESV A4C: 12 mL
LV ESV Index A2C: 7 mL/m2
LV ESV Index A4C: 6 mL/m2
LV Ejection Fraction A2C: 69 %
LV Ejection Fraction A4C: 86 %
LV Mass 2D Index: 79.5 g/m2 (ref 43–95)
LV Mass 2D: 153.4 g (ref 67–162)
LV RWT Ratio: 0.38
LVIDd Index: 2.44 cm/m2
LVIDd: 4.7 cm (ref 3.9–5.3)
LVIDs Index: 1.4 cm/m2
LVIDs: 2.7 cm
LVPWd: 0.9 cm (ref 0.6–0.9)

## 2022-11-15 MED ORDER — ALBUTEROL SULFATE (2.5 MG/3ML) 0.083% IN NEBU
RESPIRATORY_TRACT | Status: AC
Start: 2022-11-15 — End: 2022-11-15

## 2022-11-15 MED ORDER — ALBUTEROL SULFATE (2.5 MG/3ML) 0.083% IN NEBU
RESPIRATORY_TRACT | Status: DC
Start: 2022-11-15 — End: 2022-11-15

## 2022-11-15 MED ORDER — GUAIFENESIN-DM 100-10 MG/5ML PO SYRP
100-10 | ORAL | Status: DC | PRN
Start: 2022-11-15 — End: 2022-11-16

## 2022-11-15 MED ORDER — ONDANSETRON HCL 4 MG/2ML IJ SOLN
4 | Freq: Four times a day (QID) | INTRAMUSCULAR | Status: DC | PRN
Start: 2022-11-15 — End: 2022-11-18

## 2022-11-15 MED ORDER — ALBUTEROL SULFATE (2.5 MG/3ML) 0.083% IN NEBU
Freq: Four times a day (QID) | RESPIRATORY_TRACT | Status: DC | PRN
Start: 2022-11-15 — End: 2022-11-18

## 2022-11-15 MED ORDER — BENZOCAINE-MENTHOL 15-3.6 MG MT LOZG
15-3.6 | OROMUCOSAL | Status: DC | PRN
Start: 2022-11-15 — End: 2022-11-18

## 2022-11-15 MED ORDER — LACTATED RINGERS IV BOLUS
Freq: Once | INTRAVENOUS | Status: AC
Start: 2022-11-15 — End: 2022-11-15

## 2022-11-15 MED ORDER — BUDESONIDE 0.25 MG/2ML IN SUSP
0.25 | Freq: Two times a day (BID) | RESPIRATORY_TRACT | Status: DC
Start: 2022-11-15 — End: 2022-11-16

## 2022-11-15 MED ORDER — ALBUTEROL SULFATE (2.5 MG/3ML) 0.083% IN NEBU
Freq: Three times a day (TID) | RESPIRATORY_TRACT | Status: DC
Start: 2022-11-15 — End: 2022-11-18

## 2022-11-15 MED ORDER — ACETAMINOPHEN 325 MG PO TABS
325 | ORAL | Status: DC | PRN
Start: 2022-11-15 — End: 2022-11-18

## 2022-11-15 MED ADMIN — acetaminophen (TYLENOL) tablet 650 mg: 650 mg | ORAL | @ 20:00:00 | NDC 00904677361

## 2022-11-15 MED ADMIN — albuterol (PROVENTIL) (2.5 MG/3ML) 0.083% nebulizer solution 2.5 mg: 2.5 mg | RESPIRATORY_TRACT | @ 16:00:00 | NDC 00378827031

## 2022-11-15 MED ADMIN — ondansetron (ZOFRAN) injection 8 mg: 8 mg | INTRAVENOUS | @ 16:00:00 | NDC 00641607801

## 2022-11-15 MED ADMIN — albuterol (PROVENTIL) (2.5 MG/3ML) 0.083% nebulizer solution 2.5 mg: 2.5 mg | RESPIRATORY_TRACT | @ 18:00:00 | NDC 00378827031

## 2022-11-15 MED ADMIN — lactated ringers bolus 500 mL: 247.9 mL | INTRAVENOUS | @ 16:00:00 | NDC 00338011704

## 2022-11-15 MED ADMIN — guaiFENesin-dextromethorphan (ROBITUSSIN DM) 100-10 MG/5ML syrup 5 mL: 5 mL | ORAL | @ 20:00:00 | NDC 00121127610

## 2022-11-15 MED ADMIN — budesonide (PULMICORT) nebulizer suspension 250 mcg: 250 mg | RESPIRATORY_TRACT | @ 18:00:00 | NDC 00487960101

## 2022-11-15 MED FILL — ALBUTEROL SULFATE (2.5 MG/3ML) 0.083% IN NEBU: RESPIRATORY_TRACT | Qty: 3

## 2022-11-15 MED FILL — BUDESONIDE 0.25 MG/2ML IN SUSP: 0.25 MG/2ML | RESPIRATORY_TRACT | Qty: 2

## 2022-11-15 MED FILL — CHLORASEPTIC SORE THROAT 6-10 MG MT LOZG: 6-10 MG | OROMUCOSAL | Qty: 1

## 2022-11-15 MED FILL — GUAIFENESIN-DM 100-10 MG/5ML PO SYRP: 100-10 MG/5ML | ORAL | Qty: 10

## 2022-11-15 MED FILL — ACETAMINOPHEN 325 MG PO TABS: 325 MG | ORAL | Qty: 2

## 2022-11-15 MED FILL — ONDANSETRON HCL 4 MG/2ML IJ SOLN: 4 MG/2ML | INTRAMUSCULAR | Qty: 4

## 2022-11-15 NOTE — Plan of Care (Signed)
 Problem: Pain  Goal: Verbalizes/displays adequate comfort level or baseline comfort level  11/15/2022 1956 by Asher Muir, RN  Outcome: Progressing  11/15/2022 1956 by Asher Muir, RN  Outcome: Progressing

## 2022-11-15 NOTE — ED Triage Notes (Signed)
 Pt states she has had coughing, wheezing, nausea and vomiting since last Friday.  States she started to feel significantly worse yesterday.  Pt denies any leaking of fluid or vaginal bleeding, endorses good FM.

## 2022-11-15 NOTE — Progress Notes (Signed)
 ROUTINE OB VISIT    37 y.o. G2P1001 with IUP @ [redacted]w[redacted]d here for ROB care. Dr. Wetzel patient. Patient is sick. Is having nausea, vomiting, coughing/wheezing, tired. Vomits daily, tolerating PO, but decreased appetite. No h/o asthma. No fevers, chills. Patient's daughter is also sick - has not been diagnosed with anything and symptoms now resolved.     Vitals:    11/15/22 1038   BP: 110/68     BPP today for poor fetal growth - BPP 8/8, ant plac, AFI wnl, vtx.   - NOB labs up to date. RH- (s/p rhogam at 28wks), GBS collected, 3rd tri labs ordered   - Genetics: Unity wnl, too late for AFP   - Anatomy US  wnl   - s/p TDAP      H/o C/Sx1 w/ uterine window - plan for erLTCS on 10/2.  Anxiety - on klonopil  Depression- prozac    FGR - Dx 7/17 7.6% (AC 3.9%) > now resolved, last GS at 32wks (8/15): 21% (AC 54%). BPP today 8/8, continue weekly dopplers. Schedule repeat GS/BPP next week.   -Flu like symptoms, cough - recommended patient go to ED/urgent care ASAP for evaluation/treatment. Patient voiced intent to comply and will present now.     Schedule GS w/ BPP for next week with ROB with Dr. Wetzel. PTL/Labor precautions and kick counts reviewed today. Patient was advised to call with any questions or concerns. Time taken to answer all questions and patient in agreement with plan of care.

## 2022-11-15 NOTE — RT Protocol Note (Signed)
 RT Nebulizer Bronchodilator Protocol Note    There is a bronchodilator order in the chart from a provider indicating to follow the RT Bronchodilator Protocol and there is an "Initiate RT Bronchodilator Protocol" order as well (see protocol at bottom of note).    CXR Findings:  XR CHEST PORTABLE    Result Date: 11/15/2022  Shallow lung volumes. Suspect vascular congestion, no overt CHF, no focal infiltrate.      The findings from the last RT Protocol Assessment were as follows:  Smoking: None or smoker <15 pack years  Respiratory Pattern: Dyspnea on exertion or RR 21-25 bpm  Breath Sounds: Inspiratory and expiratory or bilateral wheezing and/or rhonchi  Cough: Strong, spontaneous, non-productive  Indication for Bronchodilator Therapy: Wheezing associated with pulm disorder  Bronchodilator Assessment Score: 8    Aerosolized bronchodilator medication orders have been revised according to the RT Nebulizer Bronchodilator Protocol below.    Respiratory Therapist to perform RT Therapy Protocol Assessment initially then follow the protocol.  Repeat RT Therapy Protocol Assessment PRN for score 0-3 or on second treatment, BID, and PRN for scores above 3.    No Indications - adjust the frequency to every 6 hours PRN wheezing or bronchospasm, if no treatments needed after 48 hours then discontinue using Per Protocol order mode.     If indication present, adjust the RT bronchodilator orders based on the Bronchodilator Assessment Score as indicated below.  If a patient is on this medication at home then do not decrease Frequency below that used at home.    0-3 - enter or revise RT bronchodilator order(s) to equivalent RT Bronchodilator order with Frequency of every 4 hours PRN for wheezing or increased work of breathing using Per Protocol order mode.       4-6 - enter or revise RT Bronchodilator order(s) to two equivalent RT bronchodilator orders with one order with BID Frequency and one order with Frequency of every 4 hours PRN  wheezing or increased work of breathing using Per Protocol order mode.         7-10 - enter or revise RT Bronchodilator order(s) to two equivalent RT bronchodilator orders with one order with TID Frequency and one order with Frequency of every 4 hours PRN wheezing or increased work of breathing using Per Protocol order mode.       11-13 - enter or revise RT Bronchodilator order(s) to one equivalent RT bronchodilator order with QID Frequency and an Albuterol  order with Frequency of every 4 hours PRN wheezing or increased work of breathing using Per Protocol order mode.      Greater than 13 - enter or revise RT Bronchodilator order(s) to one equivalent RT bronchodilator order with every 4 hours Frequency and an Albuterol  order with Frequency of every 2 hours PRN wheezing or increased work of breathing using Per Protocol order mode.     RT to enter RT Home Evaluation for COPD & MDI Assessment order using Per Protocol order mode.    Electronically signed by Grayce JULIANNA Hancock, RCP on 11/15/2022 at 1:21 PM

## 2022-11-15 NOTE — Consults (Signed)
 [facility] Hospitalist Service    Hospitalist Consult   Admit Date:  11/15/2022 11:29 AM   Name:  [name] [name]   Age:  37 y.o.  Sex:  female  DOB:  [birth year]   MRN:  [id number]   Room:  OBED2/01    Presenting Complaint: Breathing Problem (Shortness of breath, nausea and vomiting)    Reason(s) for Admission: No admission diagnoses are documented for this encounter.     Hospitalists consulted by No att. providers found for: SOB    History of Presenting Illness:   [name] [name] is a 37 y.o. female with history of anxiety who was admitted for cough and SOB.  Patient was seen today for routine OB visit where she had complaints of nonproductive cough with shortness of breath and dyspnea on exertion for approximately 1 week.  She states that her symptoms began with sore throat and progressed to a nonproductive cough.  It did improve initially but has gotten much worse over the past 48 hours.  She endorses coughing fits with posttussive emesis.  She denies any fevers abdominal pain or associated symptoms.  She does report that her daughter has been sick recently with a viral illness but has now recovered.  She was seen and evaluated in the [facility] ER.  Initial workup included chest x-ray volumes and questionable pulmonary edema.  COVID and influenza were negative.  Patient was treated with albuterol  nebs and IV fluids.  Hospitalist service was called and asked to see the patient in consultation for ongoing management.      Past History:  Past Medical History:   Diagnosis Date    Anxiety        Immunization History   Administered Date(s) Administered    TDaP, ADACEL (age 30y-64y), MYRTICE (age 10y+), IM, 0.5mL 10/17/2022     Allergies   Allergen Reactions    Doxycycline Anaphylaxis    Doxycycline Calcium Anaphylaxis    Levofloxacin Anaphylaxis and Other (See Comments)      Current Outpatient Medications   Medication Instructions    aluminum  & magnesium  hydroxide-simethicone  (MAALOX) 200-200-20 MG/5ML SUSP suspension 30 mLs,  Oral, EVERY 6 HOURS PRN    clonazePAM  (KLONOPIN ) 1 mg, Oral, 2 times daily    famotidine  (PEPCID ) 40 mg, Oral, EVERY EVENING    FLUoxetine  (PROZAC ) 30 mg, Oral, DAILY, Please give 20mg  tablet and 10 mg tablet for total of 30mg     lidocaine  viscous hcl (XYLOCAINE ) 2 % SOLN solution 15 mLs, Mouth/Throat, EVERY 3 HOURS PRN    ondansetron  (ZOFRAN -ODT) 4 mg, SubLINGual, EVERY 8 HOURS PRN    Prenatal Vit-Fe Fumarate-FA (PRENATAL VITAMINS) 28-0.8 MG TABS 1 tablet, Oral, DAILY    sucralfate  (CARAFATE ) 1 g, Oral, EVERY 12 HOURS SCHEDULED, Administer on an empty stomach. Do not administer antacids within 30 minutes of administration of sucralfate . Do not crush sucralfate  tablets. Tablets may be cut in half to help facilitate oral intake.      No past surgical history on file.     Social History     Substance and Sexual Activity   Drug Use Never       Family History   Problem Relation Age of Onset    Unknown Father     Breast Cancer Mother         Review of Systems:   Constitutional: [No new pain, headache, fever, chills, sweats]  Eye: [No recent visual problems]  ENMT: [No ear pain, nasal congestion, sore throat]  Respiratory: [shortness of breath, dyspnea on  exertion, nonproductive cough]  Cardiovascular: [No Chest pain, palpitations, syncope]  Gastrointestinal: [No nausea, vomiting, diarrhea. constipation or abdominal pain]  Musculoskeletl: [No new joint pain, swelling or warmth]  Neurological: [No weakness, numbness, chage in coordination, confusion]  Psychatric: [No change in mood, concentration, sleep patterns]    Except as otherwise documented above above, the patient's  complete review of systems was unremarkable.    Objective:   Patient Vitals for the past 24 hrs:   Temp Pulse Resp BP SpO2   11/15/22 1215 -- (!) 105 -- -- 97 %   11/15/22 1210 -- (!) 102 -- -- 97 %   11/15/22 1200 -- -- -- -- 98 %   11/15/22 1133 97.4 F (36.3 C) 73 24 129/67 93 %   11/15/22 1132 97.4 F (36.3 C) 93 18 129/67 95 %   11/15/22 1130  97.4 F (36.3 C) -- -- -- --       Oxygen Therapy  SpO2: 97 %  Pulse Oximetry Type: Continuous  Pulse Oximeter Device Location: Left  O2 Device: Nasal cannula  Skin Assessment: Clean, dry, & intact  O2 Flow Rate (L/min): 6 L/min  Oxygen Therapy: Supplemental oxygen    Estimated body mass index is 35.25 kg/m as calculated from the following:    Height as of this encounter: 1.6 m (5' 3).    Weight as of this encounter: 90.3 kg (199 lb).  No intake or output data in the 24 hours ending 11/15/22 1248      Physical Exam:   General:    Awake and alert, pregnant female  Head:  Normocephalic, atraumatic  Eyes:  Sclerae appear normal.  Pupils equally round.  ENT:  Nares appear normal, no drainage.  Moist oral mucosa  Neck:  No restricted ROM.  Trachea midline   CV:   Tachycardic, no jugular venous distension.  Lungs:   + wheezing, +rhonchi, no appreciable rales.  Symmetric expansion.  Abdomen:   Bowel sounds present.    Extremities: No cyanosis or clubbing.  No edema  Skin:     No rashes and normal coloration.   Warm and dry.    Neuro:  CN II-XII grossly intact.   A&Ox3  Psych:  Normal mood and affect.      I have personally reviewed labs and tests showing:  Labs & Imaging:     Labs:   Hematologic/Coags Chemistries   Recent Labs     11/15/22  1153   WBC 10.4   HGB 12.5   HCT 36.7   PLT 280     Lab Results   Component Value Date/Time    ALBUMIN 3.5 11/15/2022 11:53 AM     No components found for: HGBA1C  Lab Results   Component Value Date/Time    INR 1.0 10/13/2022 01:48 PM    PROTIME 13.3 10/13/2022 01:48 PM     Lab Results   Component Value Date/Time    APTT 26.4 10/13/2022 01:48 PM     Lab Results   Component Value Date/Time    DDIMER 0.34 04/21/2022 05:23 PM      Recent Labs     11/15/22  1153   NA 140   K 4.5   CL 107   CO2 22   BUN 5*   CREATININE 0.6   ALBUMIN 3.5   BILITOT <0.15   ALKPHOS 131*   AST 27   ALT 25     No results for input(s): GLU in the last 72 hours.  No results found for: CPK, CKMB,  TROPONINI  No results found for: IRON, FERRITIN     Inflammatory/Respiratory Diabetes   No results found for: CRP  No results found for: ESR  ABGs:  No results found for: PHART, PO2ART, HCO3, PCO2ART   Lab Results   Component Value Date/Time    CREATININE 0.6 11/15/2022 11:53 AM              XR CHEST PORTABLE    Result Date: 11/15/2022  Chest AP: 11/15/22 INDICATION: shortness of breath, [redacted] weeks pregnant. COMPARISON: 04/21/2022 FINDINGS: Heart size within normal limits. Shallow lung volumes noted. Vascular congestion without overt CHF. No focal infiltrate. No significant pleural effusion. There is no pneumothorax.     Shallow lung volumes. Suspect vascular congestion, no overt CHF, no focal infiltrate.      Echocardiogram:  No results found for this or any previous visit.      Assessment & Plan:      37 year old pregnant female who presents with shortness of breath and nonproductive cough x 1 week    Shortness of breath/nonproductive cough:  Seems consistent with a viral etiology  COVID/influenza/RSV negative  Will add full RVP  Continue with albuterol , and Pulmicort   Chest x-ray reviewed, will obtain echocardiogram  Continue with supplemental O2 as needed      Diet:  No diet orders on file  DVT PPx: per primary team  Code status: Full  Signed:  Juliene Ozell Dell, MD    ++++++++++++++++++++++++++++++++++++++++    This note was created using voice recognition software and may contain typographic errors missed during final review. The intent is to have a complete and accurate medical record.   As a valued partner in this safety effort, if you have noted factual errors, please complete the Health Information Amendment/Correct Form or call the [facility] Health Information Management Office at [phone].

## 2022-11-15 NOTE — H&P (Signed)
 Obstetrics Emergency Department H&P        CHIEF COMPLAINT:  SOB/cough    HISTORY OF PRESENT ILLNESS:   The patient is a 37 y.o. female G2P1001, Patient's last menstrual period was 03/11/2022.,  at [redacted]w[redacted]d.  Patient presents to [facility] visit today with complaints of being ill x 1 week. Has had daily vomiting with decreased appetite. Also began having productive cough with SOB at the same time. Respiratory symptoms have worsened over the past couple days. Daughter was also sick, but not diagnosed with any viraal illness such as Covid. 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    Nausea R11.0    Panic attack F41.0    Pelvic pain affecting pregnancy in first trimester, antepartum O26.891, R10.2    Polycystic ovary syndrome E28.2    Regional enteritis (HCC) K50.90    Tooth infection K04.7    Abdominal pain affecting pregnancy O26.899, R10.9   Weekly monitoring for SGA/IUGR    PAST OB HISTORY  OB History       Gravida   2    Para   1    Term   1    Preterm   0    AB   0    Living   1         SAB   0    IAB   0    Ectopic   0    Molar   0    Multiple   0    Live Births   1            History of cesarean x 1, uterine window noted on ultrasound with this pregnancy    Past Medical History:        Diagnosis Date    Anxiety      Past Surgical History:    No past surgical history on file.  Social History:    TOBACCO:   reports that she quit smoking about 1 years ago. Her smoking use included cigarettes. She started smoking about 6 years ago. She has a 2.5 pack-year  smoking history. She has never used smokeless tobacco.  ETOH:   reports that she does not currently use alcohol.  DRUGS:   reports no history of drug use.  Family History:       Problem Relation Age of Onset    Unknown Father     Breast Cancer Mother      Medications Prior to Admission:  Not in a hospital admission.  Allergies:  Doxycycline, Doxycycline calcium, and Levofloxacin    Review of Systems:   Ears, nose, mouth, throat, and face: negative  Respiratory: negative  Cardiovascular: negative  Gastrointestinal: negative  Genitourinary:negative  Integument/breast: negative  Hematologic/lymphatic: negative  Musculoskeletal:negative  Neurological: negative  Behavioral/Psych: negative  Endocrine: negative  Allergic/Immunologic: negative  Psychosocial: negative    PHYSICAL EXAM:    General  appearance:  awake, alert, cooperative, appears sob     Neurologic:  Awake, alert, oriented to name, place and time.  Cranial nerves II-XII are grossly intact.  Motor is 5 out of 5 bilaterally.  Cerebellar finger to nose, heel to shin intact.  Sensory is intact.  Babinski down going, Romberg negative, and gait is normal.  Lungs:  Increased work of breathing, poor air exchange, bilateral inspiratory and expiratory wheezing  Heart:  Normal apical impulse, regular rate and rhythm, normal S1 and S2, no S3 or S4, and no murmur noted  Abdomen:  No scars, normal bowel sounds, soft, non-distended, non-tender, no masses palpated, no hepatosplenomegally  Fetal heart rate: Cat 1  BP 129/67   Pulse 73   Temp 97.4 F (36.3 C) (Oral)   Resp 24   LMP 03/11/2022   SpO2 93%       ASSESSMENT AND PLAN:  37 y.o. female G2P1001, Patient's last menstrual period was 03/11/2022.,  at [redacted]w[redacted]d.  URI sx: have started O2 and respiratory called for albuterol  treatment. Respiratory panel sent and CXR ordered. Have also consulted hospitalist service.  3. Fetal status reassuring       Electronically signed by Suzen Jenkins Lobstein, MD on 11/15/2022 at 11:59 AM

## 2022-11-16 MED ORDER — VITAFOL-OB PO TABS
Freq: Every day | ORAL | Status: DC
Start: 2022-11-16 — End: 2022-11-18

## 2022-11-16 MED ORDER — LACTATED RINGERS IV SOLN
INTRAVENOUS | Status: DC
Start: 2022-11-16 — End: 2022-11-18

## 2022-11-16 MED ORDER — HYDROCODONE BIT-HOMATROP MBR 5-1.5 MG/5ML PO SOLN
5-1.5 | ORAL | Status: DC | PRN
Start: 2022-11-16 — End: 2022-11-16

## 2022-11-16 MED ORDER — FLUOXETINE HCL 10 MG PO CAPS
10 | Freq: Once | ORAL | Status: AC
Start: 2022-11-16 — End: 2022-11-15

## 2022-11-16 MED ORDER — CLONAZEPAM 1 MG PO TABS
1 | Freq: Once | ORAL | Status: AC
Start: 2022-11-16 — End: 2022-11-15

## 2022-11-16 MED ORDER — DEXTROMETHORPHAN POLISTIREX ER 30 MG/5ML PO SUER
30 | Freq: Two times a day (BID) | ORAL | Status: DC
Start: 2022-11-16 — End: 2022-11-16

## 2022-11-16 MED ORDER — CLONAZEPAM 1 MG PO TABS
1 | Freq: Two times a day (BID) | ORAL | Status: DC | PRN
Start: 2022-11-16 — End: 2022-11-18

## 2022-11-16 MED ORDER — FAMOTIDINE 20 MG PO TABS
20 | Freq: Once | ORAL | Status: AC
Start: 2022-11-16 — End: 2022-11-16

## 2022-11-16 MED ORDER — BETAMETHASONE SOD PHOS & ACET 6 (3-3) MG/ML IJ SUSP
6 | INTRAMUSCULAR | Status: AC
Start: 2022-11-16 — End: 2022-11-17

## 2022-11-16 MED ORDER — BENZONATATE 100 MG PO CAPS
100 | Freq: Three times a day (TID) | ORAL | Status: DC | PRN
Start: 2022-11-16 — End: 2022-11-16

## 2022-11-16 MED ORDER — BUDESONIDE 0.25 MG/2ML IN SUSP
0.25 | Freq: Two times a day (BID) | RESPIRATORY_TRACT | Status: DC
Start: 2022-11-16 — End: 2022-11-18

## 2022-11-16 MED ORDER — FAMOTIDINE 20 MG PO TABS
20 | Freq: Once | ORAL | Status: AC
Start: 2022-11-16 — End: 2022-11-15

## 2022-11-16 MED ORDER — DIPHENHYDRAMINE HCL 25 MG PO CAPS
25 | Freq: Once | ORAL | Status: AC
Start: 2022-11-16 — End: 2022-11-15

## 2022-11-16 MED ORDER — HYDROCODONE BIT-HOMATROP MBR 5-1.5 MG/5ML PO SOLN
5-1.5 | Freq: Three times a day (TID) | ORAL | Status: DC | PRN
Start: 2022-11-16 — End: 2022-11-17

## 2022-11-16 MED ORDER — CLONAZEPAM 1 MG PO TABS
1 | Freq: Two times a day (BID) | ORAL | Status: DC
Start: 2022-11-16 — End: 2022-11-16

## 2022-11-16 MED ORDER — FLUOXETINE HCL 10 MG PO CAPS
10 | Freq: Every day | ORAL | Status: DC
Start: 2022-11-16 — End: 2022-11-18

## 2022-11-16 MED ORDER — BENZONATATE 100 MG PO CAPS
100 | Freq: Three times a day (TID) | ORAL | Status: DC | PRN
Start: 2022-11-16 — End: 2022-11-18

## 2022-11-16 MED ADMIN — benzocaine-menthol (CEPACOL SORE THROAT) lozenge 1 lozenge: 1 | ORAL | @ 15:00:00 | NDC 00904625549

## 2022-11-16 MED ADMIN — guaiFENesin-dextromethorphan (ROBITUSSIN DM) 100-10 MG/5ML syrup 5 mL: 5 mL | ORAL | @ 01:00:00 | NDC 00121127610

## 2022-11-16 MED ADMIN — betamethasone acetate-betamethasone sodium phosphate (CELESTONE) injection 12 mg: 12 mg | INTRAMUSCULAR | @ 15:00:00 | NDC 00517072001

## 2022-11-16 MED ADMIN — guaiFENesin-dextromethorphan (ROBITUSSIN DM) 100-10 MG/5ML syrup 5 mL: 5 mL | ORAL | @ 15:00:00 | NDC 00121127610

## 2022-11-16 MED ADMIN — clonazePAM (KLONOPIN) tablet 1 mg: 1 mg | ORAL | @ 02:00:00 | NDC 60687055511

## 2022-11-16 MED ADMIN — ondansetron (ZOFRAN) injection 8 mg: 8 mg | INTRAVENOUS | @ 15:00:00 | NDC 00641607801

## 2022-11-16 MED ADMIN — acetaminophen (TYLENOL) tablet 650 mg: 650 mg | ORAL | @ 01:00:00 | NDC 00904677361

## 2022-11-16 MED ADMIN — budesonide (PULMICORT) nebulizer suspension 250 mcg: 250 mg | RESPIRATORY_TRACT | @ 12:00:00 | NDC 00487960101

## 2022-11-16 MED ADMIN — budesonide (PULMICORT) nebulizer suspension 250 mcg: 250 mg | RESPIRATORY_TRACT | @ 01:00:00 | NDC 00487960101

## 2022-11-16 MED ADMIN — FLUoxetine (PROZAC) capsule 30 mg: 30 mg | ORAL | @ 21:00:00 | NDC 00904734661

## 2022-11-16 MED ADMIN — albuterol (PROVENTIL) (2.5 MG/3ML) 0.083% nebulizer solution 2.5 mg: 2.5 mg | RESPIRATORY_TRACT | @ 01:00:00 | NDC 00378827031

## 2022-11-16 MED ADMIN — lactated ringers IV soln infusion: INTRAVENOUS | @ 15:00:00 | NDC 00338011704

## 2022-11-16 MED ADMIN — albuterol (PROVENTIL) (2.5 MG/3ML) 0.083% nebulizer solution 2.5 mg: 2.5 mg | RESPIRATORY_TRACT | @ 21:00:00 | NDC 00378827031

## 2022-11-16 MED ADMIN — HYDROcodone homatropine (HYCODAN) 5-1.5 MG/5ML solution 5 mL: 5 mL | ORAL | @ 08:00:00 | NDC 64950034205

## 2022-11-16 MED ADMIN — HYDROcodone homatropine (HYCODAN) 5-1.5 MG/5ML solution 5 mL: 5 mL | ORAL | @ 16:00:00 | NDC 64950034205

## 2022-11-16 MED ADMIN — benzonatate (TESSALON) capsule 100 mg: 100 mg | ORAL | @ 15:00:00 | NDC 00904715361

## 2022-11-16 MED ADMIN — clonazePAM (KLONOPIN) tablet 1 mg: 1 mg | ORAL | @ 21:00:00 | NDC 60687055511

## 2022-11-16 MED ADMIN — acetaminophen (TYLENOL) tablet 650 mg: 650 mg | ORAL | @ 08:00:00 | NDC 00904677361

## 2022-11-16 MED ADMIN — HYDROcodone homatropine (HYCODAN) 5-1.5 MG/5ML solution 5 mL: 5 mL | ORAL | @ 04:00:00 | NDC 64950034205

## 2022-11-16 MED ADMIN — albuterol (PROVENTIL) (2.5 MG/3ML) 0.083% nebulizer solution 2.5 mg: 2.5 mg | RESPIRATORY_TRACT | @ 03:00:00 | NDC 00378827031

## 2022-11-16 MED ADMIN — benzonatate (TESSALON) capsule 200 mg: 200 mg | ORAL | @ 21:00:00 | NDC 00904715361

## 2022-11-16 MED ADMIN — albuterol (PROVENTIL) (2.5 MG/3ML) 0.083% nebulizer solution 2.5 mg: 2.5 mg | RESPIRATORY_TRACT | @ 13:00:00 | NDC 00378827031

## 2022-11-16 MED ADMIN — famotidine (PEPCID) tablet 40 mg: 40 mg | ORAL | @ 02:00:00 | NDC 00904719306

## 2022-11-16 MED ADMIN — diphenhydrAMINE (BENADRYL) capsule 25 mg: 25 mg | ORAL | @ 02:00:00 | NDC 00904723761

## 2022-11-16 MED ADMIN — FLUoxetine (PROZAC) capsule 30 mg: 30 mg | ORAL | @ 02:00:00 | NDC 00904578461

## 2022-11-16 MED ADMIN — ondansetron (ZOFRAN) injection 8 mg: 8 mg | INTRAVENOUS | @ 01:00:00 | NDC 00641607801

## 2022-11-16 MED ADMIN — benzocaine-menthol (CEPACOL SORE THROAT) lozenge 1 lozenge: 1 | ORAL | @ 01:00:00 | NDC 63824071016

## 2022-11-16 MED ADMIN — benzocaine-menthol (CEPACOL SORE THROAT) lozenge 1 lozenge: 1 | ORAL | @ 21:00:00 | NDC 00904625549

## 2022-11-16 MED ADMIN — albuterol (PROVENTIL) (2.5 MG/3ML) 0.083% nebulizer solution 2.5 mg: 2.5 mg | RESPIRATORY_TRACT | @ 16:00:00 | NDC 00378827031

## 2022-11-16 MED ADMIN — prenatal vitamin (Vitafol-OB) tablet 1 tablet: 1 | ORAL | @ 15:00:00 | NDC 00642007912

## 2022-11-16 MED FILL — FLUOXETINE HCL 10 MG PO CAPS: 10 MG | ORAL | Qty: 3

## 2022-11-16 MED FILL — SORE THROAT RELIEF 15-3.6 MG MT LOZG: OROMUCOSAL | Qty: 1

## 2022-11-16 MED FILL — HYCODAN 5-1.5 MG/5ML PO SOLN: ORAL | Qty: 5

## 2022-11-16 MED FILL — BETAMETHASONE SOD PHOS & ACET 6 (3-3) MG/ML IJ SUSP: 6 (3-3) MG/ML | INTRAMUSCULAR | Qty: 5

## 2022-11-16 MED FILL — DIPHENHYDRAMINE HCL 25 MG PO CAPS: 25 MG | ORAL | Qty: 1

## 2022-11-16 MED FILL — ONDANSETRON HCL 4 MG/2ML IJ SOLN: 4 MG/2ML | INTRAMUSCULAR | Qty: 4

## 2022-11-16 MED FILL — ALBUTEROL SULFATE (2.5 MG/3ML) 0.083% IN NEBU: RESPIRATORY_TRACT | Qty: 3

## 2022-11-16 MED FILL — VITAFOL-OB PO TABS: ORAL | Qty: 1

## 2022-11-16 MED FILL — ACETAMINOPHEN 325 MG PO TABS: 325 MG | ORAL | Qty: 2

## 2022-11-16 MED FILL — GUAIFENESIN-DM 100-10 MG/5ML PO SYRP: 100-10 MG/5ML | ORAL | Qty: 10

## 2022-11-16 MED FILL — FAMOTIDINE 20 MG PO TABS: 20 MG | ORAL | Qty: 2

## 2022-11-16 MED FILL — CLONAZEPAM 1 MG PO TABS: 1 MG | ORAL | Qty: 1

## 2022-11-16 MED FILL — BENZONATATE 100 MG PO CAPS: 100 MG | ORAL | Qty: 1

## 2022-11-16 MED FILL — FLUOXETINE HCL 10 MG PO CAPS: 10 MG | ORAL | Qty: 1

## 2022-11-16 MED FILL — BUDESONIDE 0.25 MG/2ML IN SUSP: 0.25 MG/2ML | RESPIRATORY_TRACT | Qty: 2

## 2022-11-16 MED FILL — BENZONATATE 100 MG PO CAPS: 100 MG | ORAL | Qty: 2

## 2022-11-16 NOTE — Plan of Care (Signed)
 Problem: Respiratory - Adult  Goal: Able to breathe comfortably  11/16/2022 0831 by Nicholaus Ozell Oz, RCP  Outcome: Progressing  Goal: Lung sounds clear or within normal limits for patient  11/16/2022 0831 by Nicholaus Ozell Oz, RCP  Outcome: Progressing     Problem: Infection - Adult  Goal: Absence of infection during hospitalization  Outcome: Progressing     Problem: Safety - Adult  Goal: Free from fall injury  Outcome: Progressing     Problem: Discharge Planning  Goal: Discharge to home or other facility with appropriate resources  Outcome: Progressing

## 2022-11-16 NOTE — Progress Notes (Addendum)
 [facility]  [facility] [address] LABOR & DELIVERY  [address]  [place] [address]  Dept: [phone]  Loc: [phone]     HD#2 admit for shortness of breath, nonproductive cough, +rhinovirus.    History of Present Illness:    Patient states she is feeling a little better this am - feels hycodan  is the only thing helping her cough. She is tolerating PO intake. Denies CX, VB, LOF. Endorses good fetal movement, however has been coughing more this am and not sure about how much fetal movement this am. She denies fevers, chills. States she continues to cough and feel short of breath.     Allergies:  Allergies   Allergen Reactions    Doxycycline Anaphylaxis    Doxycycline Calcium Anaphylaxis    Levofloxacin Anaphylaxis and Other (See Comments)       Prior to Admission medications    Medication Sig Start Date End Date Taking? Authorizing Provider   FLUoxetine  (PROZAC ) 10 MG capsule Take 3 capsules by mouth daily Please give 20mg  tablet and 10 mg tablet for total of 30mg  10/31/22  Yes Schwartzberg, Powell RAMAN, MD   clonazePAM  (KLONOPIN ) 1 MG tablet Take 1 tablet by mouth in the morning and at bedtime for 30 days. Max Daily Amount: 2 mg 10/31/22 11/30/22 Yes Schwartzberg, Powell RAMAN, MD   ondansetron  (ZOFRAN -ODT) 4 MG disintegrating tablet Place 1 tablet under the tongue every 8 hours as needed for Nausea or Vomiting 10/13/22  Yes Karyl Velia SAUNDERS, MD   famotidine  (PEPCID ) 40 MG tablet Take 1 tablet by mouth every evening 10/13/22  Yes Wannamaker, Velia SAUNDERS, MD   Prenatal Vit-Fe Fumarate-FA (PRENATAL VITAMINS) 28-0.8 MG TABS Take 1 tablet by mouth daily 04/18/22  Yes Taft Josette Maier, PA-C   aluminum  & magnesium  hydroxide-simethicone  (MAALOX) 200-200-20 MG/5ML SUSP suspension Take 30 mLs by mouth every 6 hours as needed for Indigestion  Patient not taking: Reported on 11/15/2022 10/13/22   Karyl Velia SAUNDERS, MD   lidocaine  viscous hcl (XYLOCAINE ) 2 % SOLN solution Take 15 mLs by mouth every 3 hours as  needed for Irritation  Patient not taking: Reported on 11/15/2022 10/13/22   Karyl Velia SAUNDERS, MD   sucralfate  (CARAFATE ) 1 GM tablet Take 1 tablet by mouth every 12 hours Administer on an empty stomach. Do not administer antacids within 30 minutes of administration of sucralfate . Do not crush sucralfate  tablets. Tablets may be cut in half to help facilitate oral intake.  Patient not taking: Reported on 11/15/2022 10/14/22   Karyl Velia SAUNDERS, MD          OB History       Gravida   2    Para   1    Term   1    Preterm   0    AB   0    Living   1         SAB   0    IAB   0    Ectopic   0    Molar   0    Multiple   0    Live Births   1                Past Medical History:   Diagnosis Date    Anxiety         No past surgical history on file.    Social History     Tobacco Use   Smoking Status Former  Current packs/day: 0.00    Average packs/day: 0.5 packs/day for 5.0 years (2.5 ttl pk-yrs)    Types: Cigarettes    Start date: 12/03/2015    Quit date: 12/2020    Years since quitting: 1.9   Smokeless Tobacco Never       Social History     Substance and Sexual Activity   Alcohol Use Not Currently        Family History   Problem Relation Age of Onset    Unknown Father     Breast Cancer Mother      Review of Systems:      As noted in HPI.     Exam:  BP (!) 98/53   Pulse (!) 114   Temp 98 F (36.7 C) (Oral)   Resp 20   Ht 1.6 m (5' 3)   Wt 90.3 kg (199 lb)   LMP 03/11/2022   SpO2 100%   BMI 35.25 kg/m , Body mass index is 35.25 kg/m.  General Examination:  GENERAL APPEARANCE: well nourished, well developed, female. Sitting up occasionally coughing in bed. Appears to feel unwell.   HEAD: normocephalic, atraumatic.   NECK: neck supple, full range of motion  HEART: normal rate   LUNGS: +wheezing throughout, no rales. Symmetric expansion.   ABDOMEN: nontender, gravid.   SKIN: no suspicious lesions, warm and dry.   PSYCH: alert, oriented.       EFM: Reactive BID > now on continuous: 170bpm, mod var, +accels,  -decels.   Toco: irritable     Assessment/Plan:  Patient is a 37 y.o. G2P1001 at [redacted]w[redacted]d HD#2 admit shortness of breath, nonproductive cough, +rhinovirus in pregnancy. Pregnancy otherwise complicated by AMA, RH negative, h/o C/Sx1 with plan for repeat C/S (scheduled for 10/2 with Dr. Wetzel), anxiety/depression, resolved FGR, BMI 35.    Shortness of breath, cough  -Vitals overall stable, occasional tachycardia during coughing spells in 100s, O2 sat: 97-100% on 2L O2 NC  -Taper O2 as tolerated, plan for walk test prior to discharge.  -Positive for rhinovirus, rest of panel negative   -CXR 9/13: Vascular congestion, shallow lung volumes, otherwise wnl  -Echo 9/13: wnl, EF 770-75%  -WBC 10.4, no anemia, CMP wnl   -IVF LR 100cc/hr  -Meds: Start BTMZ course and Tessalon  200mg  TID. Continue Proventil  nebulizer TID with RT, Pulmicort  BID with RT, lozenges PRN. Has been getting hycodan  every 4 hours which patient feels helps the most - discussed tapering this off as tolerated to avoid longer term use in pregnancy. Patient refusing mucinex /robitussin.   -Consider oral steroid taper if still not improving. GTT wnl, glucose wnl on admit.  -Tylenol  PRN for pain, antiemetics PRN.  -Hospitalist service following     Pregnancy  -CEFM while on O2 - currently tachycardic, but otherwise reassuring- tachycardia should improve as maternal status improves.   -Continue IVF, regular diet, SCDs in bed.  -PNV QD  -NOB labs wnl. RH negative (s/p rhogam at 28wks), GTT wnl, GBS-, 3rd tri labs in process, Unity and anatomy US  wnl.   -S/p TDAP    FGR (resolved)  -Dx 7/17 7.6% (AC 3.9%) > now resolved, last GS at 32wks (8/15): 21% (AC 54%). Continue weekly dopplers. Repeat GS/BPP scheduled with Dr. Wetzel next week.     Anxiety/depression  -Continue home klonopin  and prozac     Dispo: Continued inpatient monitoring and management. Plan for discharge once coughing/shortness of breath improve. Plan for walk test prior to discharge, keeping O2  >95%. Plan discussed with  patient and nursing, all questions answered and all in agreement with plan of care.    Electronically signed by: Tinnie CHRISTELLA Fell, DO 11/16/2022

## 2022-11-16 NOTE — Progress Notes (Addendum)
 [facility] Hospitalist Service     Hospitalist Progress Note     PCP: Luwana Earnie BRAVO, MD   Admission Date: 11/15/2022        Subjective   Lying in bed, awake and alert  Reports Hycodan  is the only thing that helped her cough  Still short of breath but somewhat better  No new complaints this a.m.  Overnight activity reviewed with nursing staff.       Objective   Blood pressure (!) 98/53, pulse 88, temperature 98 F (36.7 C), temperature source Oral, resp. rate 18, height 1.6 m (5' 3), weight 90.3 kg (199 lb), last menstrual period 03/11/2022, SpO2 100%.  Body mass index is 35.25 kg/m.      Intake/Output Summary (Last 24 hours) at 11/16/2022 0923  Last data filed at 11/16/2022 0409  Gross per 24 hour   Intake --   Output 1100 ml   Net -1100 ml         Physical Exam:  General:          Awake and alert, pregnant female  CV:                  RRR, no jugular venous distension.  Lungs:             + wheezing, no appreciable rales.  Symmetric expansion.  Abdomen:        Bowel sounds present.    Extremities:     No cyanosis or clubbing.  No edema  Skin:                No rashes , Warm and dry.    Neuro:             NFD           Labs and Imaging     Data: I have personally reviewed all lab results and independently reviewed imaging studies performed in the past 24 hours.    Hematologic/Coags Chemistries   Recent Labs     11/15/22  1153   WBC 10.4   HGB 12.5   HCT 36.7   PLT 280     Lab Results   Component Value Date/Time    ALBUMIN 3.5 11/15/2022 11:53 AM     No components found for: HGBA1C  Lab Results   Component Value Date/Time    INR 1.0 10/13/2022 01:48 PM    PROTIME 13.3 10/13/2022 01:48 PM     Lab Results   Component Value Date/Time    APTT 26.4 10/13/2022 01:48 PM     Lab Results   Component Value Date/Time    DDIMER 0.34 04/21/2022 05:23 PM      Recent Labs     11/15/22  1153   NA 140   K 4.5   CL 107   CO2 22   BUN 5*   CREATININE 0.6   ALBUMIN 3.5   BILITOT <0.15   ALKPHOS 131*   AST 27   ALT 25     No results  for input(s): GLU in the last 72 hours.  No results found for: CPK, CKMB, TROPONINI  No results found for: IRON, FERRITIN     Inflammatory/Respiratory Diabetes   No results found for: CRP  No results found for: ESR  ABGs:  No results found for: PHART, PO2ART, HCO3, PCO2ART   Lab Results   Component Value Date/Time    CREATININE 0.6 11/15/2022 11:53 AM  Studies:  XR CHEST PORTABLE   Final Result   Shallow lung volumes. Suspect vascular congestion, no overt CHF, no focal    infiltrate.          Medications     . budesonide   0.25 mg Nebulization BID   . albuterol   2.5 mg Nebulization TID RT         (Note: the above list excludes PRNs)    Assessment & Plan     Hospital Problems             Last Modified POA    * (Principal) Respiratory infection 11/15/2022 Yes      37 year old pregnant female who presents with shortness of breath and nonproductive cough x 1 week     Shortness of breath/nonproductive cough:  COVID/influenza/RSV negative   RVP + for rhinovirus  Chest x-ray reviewed, echocardiogram-reviewed pEF  Continue with supplemental O2 as needed  Continue with albuterol   Could give trial of prednisone  taper    ADULT DIET; Regular   DVT ppx: Per primary service    Juliene Ozell Dell, MD  11/16/2022 9:23 AM  [facility] Hospitalist Service    ++++++++++++++++++++++++++++++++++++++++    This note was created using voice recognition software and may contain typographic errors missed during final review. The intent is to have a complete and accurate medical record.   As a valued partner in this safety effort, if you have noted factual errors, please contact the [facility] Hospitalist Service at [phone].

## 2022-11-16 NOTE — Progress Notes (Signed)
 Pt ambulated on room air for 20 minutes (2030-2050). Pt gait steady; no assisted devices needed. Pt denied any dizziness, burred vision, or fatigue. Pt alert and oriented. Pt 02 sats while ambulating on room air were between 95-97%. Pt did report mild shortness of breath towards the end of the walk. Oral care and bed linen changed. SCDs applied and turned on. Pt placed on 1L nasal cannula. Occasional dry productive cough noted with thin clear secretions noted. Pt states, I feel so much better tonight then I did this morning.

## 2022-11-17 MED ORDER — NORMAL SALINE FLUSH 0.9 % IV SOLN
0.9 | INTRAVENOUS | Status: AC
Start: 2022-11-17 — End: 2022-11-17

## 2022-11-17 MED ORDER — FAMOTIDINE 20 MG PO TABS
20 | Freq: Every evening | ORAL | Status: DC
Start: 2022-11-17 — End: 2022-11-18

## 2022-11-17 MED ORDER — NORMAL SALINE FLUSH 0.9 % IV SOLN
0.9 | INTRAVENOUS | Status: DC | PRN
Start: 2022-11-17 — End: 2022-11-18

## 2022-11-17 MED ORDER — PROMETHAZINE HCL 6.25 MG/5ML PO SOLN
6.25 | Freq: Three times a day (TID) | ORAL | Status: AC
Start: 2022-11-17 — End: 2022-11-18

## 2022-11-17 MED ORDER — PREDNISONE 10 MG PO TABS
10 | Freq: Every day | ORAL | Status: DC
Start: 2022-11-17 — End: 2022-11-18

## 2022-11-17 MED ORDER — SODIUM CHLORIDE 0.9 % IV SOLN
0.9 | INTRAVENOUS | Status: DC | PRN
Start: 2022-11-17 — End: 2022-11-18

## 2022-11-17 MED ORDER — PREDNISONE 20 MG PO TABS
20 | Freq: Every day | ORAL | Status: DC
Start: 2022-11-17 — End: 2022-11-18

## 2022-11-17 MED ORDER — PREDNISONE 20 MG PO TABS
20 | Freq: Every day | ORAL | Status: AC
Start: 2022-11-17 — End: 2022-11-18

## 2022-11-17 MED ORDER — NORMAL SALINE FLUSH 0.9 % IV SOLN
0.9 | Freq: Two times a day (BID) | INTRAVENOUS | Status: DC
Start: 2022-11-17 — End: 2022-11-18

## 2022-11-17 MED ORDER — GUAIFENESIN-CODEINE 100-10 MG/5ML PO SOLN
100-10 | Freq: Three times a day (TID) | ORAL | Status: AC | PRN
Start: 2022-11-17 — End: 2022-11-18

## 2022-11-17 MED ORDER — HYDROCODONE BIT-HOMATROP MBR 5-1.5 MG/5ML PO SOLN
5-1.5 | Freq: Two times a day (BID) | ORAL | Status: DC
Start: 2022-11-17 — End: 2022-11-17

## 2022-11-17 MED ADMIN — promethazine (PHENERGAN) 6.25 MG/5ML syrup 6.25 mg: 6.25 mg | ORAL | @ 16:00:00 | NDC 09999991177

## 2022-11-17 MED ADMIN — budesonide (PULMICORT) nebulizer suspension 250 mcg: 250 mg | RESPIRATORY_TRACT | @ 02:00:00 | NDC 00487960101

## 2022-11-17 MED ADMIN — acetaminophen (TYLENOL) tablet 650 mg: 650 mg | ORAL | @ 14:00:00 | NDC 00904677361

## 2022-11-17 MED ADMIN — HYDROcodone homatropine (HYCODAN) 5-1.5 MG/5ML solution 5 mL: 5 mL | ORAL | NDC 64950034205

## 2022-11-17 MED ADMIN — ondansetron (ZOFRAN) injection 8 mg: 8 mg | INTRAVENOUS | @ 08:00:00 | NDC 00641607801

## 2022-11-17 MED ADMIN — HYDROcodone homatropine (HYCODAN) 5-1.5 MG/5ML solution 5 mL: 5 mL | ORAL | @ 08:00:00 | NDC 64950034205

## 2022-11-17 MED ADMIN — albuterol (PROVENTIL) (2.5 MG/3ML) 0.083% nebulizer solution 2.5 mg: 2.5 mg | RESPIRATORY_TRACT | @ 18:00:00 | NDC 00378827031

## 2022-11-17 MED ADMIN — albuterol (PROVENTIL) (2.5 MG/3ML) 0.083% nebulizer solution 2.5 mg: 2.5 mg | RESPIRATORY_TRACT | @ 02:00:00 | NDC 00378827031

## 2022-11-17 MED ADMIN — prenatal vitamin (Vitafol-OB) tablet 1 tablet: 1 | ORAL | @ 13:00:00 | NDC 00642007912

## 2022-11-17 MED ADMIN — budesonide (PULMICORT) nebulizer suspension 250 mcg: 250 mg | RESPIRATORY_TRACT | @ 13:00:00 | NDC 00487960101

## 2022-11-17 MED ADMIN — albuterol (PROVENTIL) (2.5 MG/3ML) 0.083% nebulizer solution 2.5 mg: 2.5 mg | RESPIRATORY_TRACT | @ 13:00:00 | NDC 00378827031

## 2022-11-17 MED ADMIN — benzonatate (TESSALON) capsule 200 mg: 200 mg | ORAL | @ 05:00:00 | NDC 00904715361

## 2022-11-17 MED ADMIN — famotidine (PEPCID) tablet 40 mg: 40 mg | ORAL | NDC 00904719306

## 2022-11-17 MED ADMIN — predniSONE (DELTASONE) tablet 40 mg: 40 mg | ORAL | @ 20:00:00 | NDC 00054001825

## 2022-11-17 MED ADMIN — clonazePAM (KLONOPIN) tablet 1 mg: 1 mg | ORAL | @ 11:00:00 | NDC 60687055511

## 2022-11-17 MED ADMIN — guaiFENesin-codeine (guaiFENesin AC) 100-10 MG/5ML liquid 5 mL: 5 mL | ORAL | @ 16:00:00 | NDC 09999990261

## 2022-11-17 MED ADMIN — acetaminophen (TYLENOL) tablet 650 mg: 650 mg | ORAL | NDC 00904677361

## 2022-11-17 MED ADMIN — ondansetron (ZOFRAN) injection 8 mg: 8 mg | INTRAVENOUS | NDC 00641607801

## 2022-11-17 MED ADMIN — lactated ringers IV soln infusion: INTRAVENOUS | NDC 00338011704

## 2022-11-17 MED ADMIN — lactated ringers IV soln infusion: INTRAVENOUS | @ 11:00:00 | NDC 00338011704

## 2022-11-17 MED ADMIN — FLUoxetine (PROZAC) capsule 30 mg: 30 mg | ORAL | @ 13:00:00 | NDC 00904734661

## 2022-11-17 MED ADMIN — acetaminophen (TYLENOL) tablet 650 mg: 650 mg | ORAL | @ 08:00:00 | NDC 00904677361

## 2022-11-17 MED ADMIN — ondansetron (ZOFRAN) injection 8 mg: 8 mg | INTRAVENOUS | @ 13:00:00 | NDC 00641607801

## 2022-11-17 MED ADMIN — benzonatate (TESSALON) capsule 200 mg: 200 mg | ORAL | @ 16:00:00 | NDC 42806071401

## 2022-11-17 MED ADMIN — clonazePAM (KLONOPIN) tablet 1 mg: 1 mg | ORAL | @ 23:00:00 | NDC 60687055511

## 2022-11-17 MED ADMIN — betamethasone acetate-betamethasone sodium phosphate (CELESTONE) injection 12 mg: 12 mg | INTRAMUSCULAR | @ 14:00:00 | NDC 00517072001

## 2022-11-17 MED FILL — CLONAZEPAM 1 MG PO TABS: 1 MG | ORAL | Qty: 1

## 2022-11-17 MED FILL — NORMAL SALINE FLUSH 0.9 % IV SOLN: 0.9 % | INTRAVENOUS | Qty: 10

## 2022-11-17 MED FILL — ALBUTEROL SULFATE (2.5 MG/3ML) 0.083% IN NEBU: RESPIRATORY_TRACT | Qty: 3

## 2022-11-17 MED FILL — VITAFOL-OB PO TABS: ORAL | Qty: 1

## 2022-11-17 MED FILL — BENZONATATE 100 MG PO CAPS: 100 MG | ORAL | Qty: 2

## 2022-11-17 MED FILL — ACETAMINOPHEN 325 MG PO TABS: 325 MG | ORAL | Qty: 2

## 2022-11-17 MED FILL — FLUOXETINE HCL 10 MG PO CAPS: 10 MG | ORAL | Qty: 1

## 2022-11-17 MED FILL — PROMETHAZINE HCL 6.25 MG/5ML PO SOLN: 6.25 MG/5ML | ORAL | Qty: 5

## 2022-11-17 MED FILL — GUAIFENESIN-CODEINE 100-10 MG/5ML PO SYRP: 100-10 MG/5ML | ORAL | Qty: 5

## 2022-11-17 MED FILL — BUDESONIDE 0.25 MG/2ML IN SUSP: 0.25 MG/2ML | RESPIRATORY_TRACT | Qty: 2

## 2022-11-17 MED FILL — HYCODAN 5-1.5 MG/5ML PO SOLN: ORAL | Qty: 5

## 2022-11-17 MED FILL — ONDANSETRON HCL 4 MG/2ML IJ SOLN: 4 MG/2ML | INTRAMUSCULAR | Qty: 4

## 2022-11-17 MED FILL — PREDNISONE 20 MG PO TABS: 20 MG | ORAL | Qty: 2

## 2022-11-17 MED FILL — BETAMETHASONE SOD PHOS & ACET 6 (3-3) MG/ML IJ SUSP: 6 (3-3) MG/ML | INTRAMUSCULAR | Qty: 5

## 2022-11-17 NOTE — Progress Notes (Signed)
 [facility]  [facility] LABOR & DELIVERY  [address]  [place] [address]  Dept: [phone]  Loc: [phone]     HD#3 admit for shortness of breath, nonproductive cough, +rhinovirus.    History of Present Illness:    Patient states she is feeling better this am - reports continued occasional dry cough, wheezing, shortness of breath. She is tolerating PO intake. Denies VB, LOF. Endorses good fetal movement. Reports occasional pelvic cramping this am - but nothing persistent/worsening. She denies fevers, chills. Patient using purewick in bed due to loss of urine with coughing spells.     Patient walked for yesterday and per RN maintained O2 sat% above 95%, did feel short of breath towards end of walk.     Allergies:  Allergies   Allergen Reactions    Doxycycline Anaphylaxis    Doxycycline Calcium Anaphylaxis    Levofloxacin Anaphylaxis and Other (See Comments)       Prior to Admission medications    Medication Sig Start Date End Date Taking? Authorizing Provider   FLUoxetine  (PROZAC ) 10 MG capsule Take 3 capsules by mouth daily Please give 20mg  tablet and 10 mg tablet for total of 30mg  10/31/22  Yes Schwartzberg, Powell RAMAN, MD   clonazePAM  (KLONOPIN ) 1 MG tablet Take 1 tablet by mouth in the morning and at bedtime for 30 days. Max Daily Amount: 2 mg 10/31/22 11/30/22 Yes Schwartzberg, Powell RAMAN, MD   ondansetron  (ZOFRAN -ODT) 4 MG disintegrating tablet Place 1 tablet under the tongue every 8 hours as needed for Nausea or Vomiting 10/13/22  Yes Karyl Velia SAUNDERS, MD   famotidine  (PEPCID ) 40 MG tablet Take 1 tablet by mouth every evening 10/13/22  Yes Wannamaker, Velia SAUNDERS, MD   Prenatal Vit-Fe Fumarate-FA (PRENATAL VITAMINS) 28-0.8 MG TABS Take 1 tablet by mouth daily 04/18/22  Yes Taft Josette Maier, PA-C   aluminum  & magnesium  hydroxide-simethicone  (MAALOX) 200-200-20 MG/5ML SUSP suspension Take 30 mLs by mouth every 6 hours as needed for Indigestion  Patient not taking: Reported  on 11/15/2022 10/13/22   Karyl Velia SAUNDERS, MD   lidocaine  viscous hcl (XYLOCAINE ) 2 % SOLN solution Take 15 mLs by mouth every 3 hours as needed for Irritation  Patient not taking: Reported on 11/15/2022 10/13/22   Karyl Velia SAUNDERS, MD   sucralfate  (CARAFATE ) 1 GM tablet Take 1 tablet by mouth every 12 hours Administer on an empty stomach. Do not administer antacids within 30 minutes of administration of sucralfate . Do not crush sucralfate  tablets. Tablets may be cut in half to help facilitate oral intake.  Patient not taking: Reported on 11/15/2022 10/14/22   Karyl Velia SAUNDERS, MD          OB History       Gravida   2    Para   1    Term   1    Preterm   0    AB   0    Living   1         SAB   0    IAB   0    Ectopic   0    Molar   0    Multiple   0    Live Births   1                Past Medical History:   Diagnosis Date    Anxiety         No past surgical history  on file.    Social History     Tobacco Use   Smoking Status Former    Current packs/day: 0.00    Average packs/day: 0.5 packs/day for 5.0 years (2.5 ttl pk-yrs)    Types: Cigarettes    Start date: 12/03/2015    Quit date: 12/2020    Years since quitting: 1.9   Smokeless Tobacco Never       Social History     Substance and Sexual Activity   Alcohol Use Not Currently        Family History   Problem Relation Age of Onset    Unknown Father     Breast Cancer Mother      Review of Systems:      As noted in HPI.     Exam:  BP (!) 95/52   Pulse 79   Temp 97.9 F (36.6 C) (Oral)   Resp 20   Ht 1.6 m (5' 3)   Wt 90.3 kg (199 lb)   LMP 03/11/2022   SpO2 97%   BMI 35.25 kg/m , Body mass index is 35.25 kg/m.  General Examination:  GENERAL APPEARANCE: well nourished, well developed, female. Sitting up occasionally coughing in bed.   HEAD: normocephalic, atraumatic. NC in place.   NECK: neck supple, full range of motion  HEART: normal rate   LUNGS: +wheezing throughout, no rales. Symmetric expansion.   ABDOMEN: nontender, gravid.   SKIN: no suspicious  lesions, warm and dry.   PSYCH: alert, oriented.       EFM: 150bpm, mod var, +accels, -decels. Occasional periods of fetal tachycardia up to 170bpm with mod var maintained.   Toco: irritable     Assessment/Plan:  Patient is a 37 y.o. G2P1001 at [redacted]w[redacted]d HD#3 admit shortness of breath, nonproductive cough, +rhinovirus in pregnancy. Pregnancy otherwise complicated by AMA, RH negative, h/o C/Sx1 with plan for repeat C/S (scheduled for 10/2 with Dr. Wetzel), anxiety/depression, resolved FGR, BMI 35.    Shortness of breath, cough  -Vitals overall stable, O2 sat: 95-100% on 2L O2 NC > decreased to 1L O2 this am with O2 sat% at 96%.  -Taper O2 as tolerated, plan for walk test prior to discharge.  -Positive for rhinovirus, rest of panel negative   -CXR 9/13: Vascular congestion, shallow lung volumes, otherwise wnl  -Echo 9/13: wnl, EF 770-75%  -WBC 10.4, no anemia, CMP wnl   -IVF LR 100cc/hr  -Meds: Continue Tessalon  200mg  TID. Continue Proventil  nebulizer TID with RT, Pulmicort  BID with RT, lozenges PRN. Started phenergan  syrup q8hrs and pepcid  40mg  QD. Changed hycoden to guaifenesin -codeine  every 8 hours PRN. Discussed tapering codeine  as tolerated to avoid longer term use in pregnancy.   -S/p BTMZ x2. Discussed starting oral steroid taper with hospitalist this am which Dr. Billy agrees with, will initiate today. GTT wnl, glucose wnl on admit.  -Hospitalist service following - appreciate recommendations     Pregnancy  -CEFM while on O2 - periods of tachycardia, but otherwise reassuring- tachycardia should improve as maternal status improves. Continue IVF.   -Continue regular diet, SCDs in bed.  -PNV QD  -NOB labs wnl. RH negative (s/p rhogam at 28wks), GTT wnl, GBS-, 3rd tri labs in process, Unity and anatomy US  wnl.   -S/p TDAP    FGR (resolved)  -Dx 7/17 7.6% (AC 3.9%) > now resolved, last GS at 32wks (8/15): 21% (AC 54%). Continue weekly dopplers. Repeat GS/BPP scheduled with Dr. Wetzel next week.      Anxiety/depression  -  Continue home klonopin  and prozac     Dispo: Continued inpatient monitoring and management. Plan for discharge once coughing/shortness of breath improve. Plan for walk test prior to discharge, keeping O2 >95%. Plan discussed with patient and nursing, all questions answered and all in agreement with plan of care.    Electronically signed by: Tinnie CHRISTELLA Fell, DO 11/17/2022

## 2022-11-17 NOTE — Progress Notes (Addendum)
 [facility] Hospitalist Service     Hospitalist Progress Note     PCP: Luwana Earnie BRAVO, MD   Admission Date: 11/15/2022    Admitted from: home    Summary:  37 year old G2 P1-0-0-1 at 35+w admitted 9/13 for dyspnea, hypoxia and found to have rhinovirus.     Subjective     She reports overall still feeling fairly ill, though somewhat improved at least respiratory wise. She is afebrile. She is tolerating p.o. Still on 1 L NC today. Still with significant cough, though better with Hycodan .     Physical Exam     Vitals: BP (!) 95/52   Pulse 79   Temp 97.9 F (36.6 C) (Oral)   Resp 20   Ht 1.6 m (5' 3)   Wt 90.3 kg (199 lb)   LMP 03/11/2022   SpO2 97%   BMI 35.25 kg/m Temp (24hrs), Avg:97.9 F (36.6 C), Min:97.6 F (36.4 C), Max:98.4 F (36.9 C)    General: NAD  HEENT: AT/NC, mmm  Lungs: Nonlabored, symmetric. Clear but with faint wheezes noted bilaterally. No focal crackles  Abdomen: Positive BS, gravid, positive fetal movement, nontender  Neuro: Awake, alert, no focal deficits     Labs and Imaging     Data: I have personally reviewed all lab results and independently reviewed imaging studies performed in the past 24 hours.    Hematologic/Coags Chemistries   Recent Labs     11/15/22  1153   WBC 10.4   HGB 12.5   HCT 36.7   PLT 280     Lab Results   Component Value Date/Time    ALBUMIN 3.5 11/15/2022 11:53 AM     No components found for: HGBA1C  Lab Results   Component Value Date/Time    INR 1.0 10/13/2022 01:48 PM    PROTIME 13.3 10/13/2022 01:48 PM     Lab Results   Component Value Date/Time    APTT 26.4 10/13/2022 01:48 PM     Lab Results   Component Value Date/Time    DDIMER 0.34 04/21/2022 05:23 PM      Recent Labs     11/15/22  1153   NA 140   K 4.5   CL 107   CO2 22   BUN 5*   CREATININE 0.6   ALBUMIN 3.5   BILITOT <0.15   ALKPHOS 131*   AST 27   ALT 25     No results for input(s): GLU in the last 72 hours.  No results found for: CPK, CKMB, TROPONINI  No results found for: IRON, FERRITIN      Inflammatory/Respiratory Diabetes   No results found for: CRP  No results found for: ESR  ABGs:  No results found for: PHART, PO2ART, HCO3, PCO2ART   Lab Results   Component Value Date/Time    CREATININE 0.6 11/15/2022 11:53 AM              Microbiology:  UA: No results found for: UA  Gram Stain: No results found for: LABGRAM   Cultures : No results for input(s): ORG in the last 72 hours. No results found for: [facility]    Radiology:  No results found.  11/15/22    ECHO (TTE) LIMITED (PRN CONTRAST/BUBBLE/STRAIN/3D) 11/15/2022  3:28 PM (Final)    Interpretation Summary  .  Left Ventricle: Normal left ventricular systolic function with a visually estimated EF of 70 - 75%. Left ventricle size is normal. Normal wall thickness. Normal wall motion. Normal diastolic function.  SABRA  Right Ventricle: Right ventricle size is normal. Normal systolic function.  .  Valves: No hemodynamically significant valve dysfunction.  .  Image quality is adequate. Limited Doppler study due to patient's condition and limited study due to patient's ability to tolerate test.    Signed by: Reyes Cava, MD on 11/15/2022  3:28 PM        I/O:  Date 11/17/22 0000 - 11/17/22 2359   Shift 9999-9240 9199-8440 1600-2359 24 Hour Total   INTAKE   Shift Total(mL/kg)       OUTPUT   Urine(mL/kg/hr) 1100(1.5) 800  1900   Shift Total(mL/kg) 1100(12.2) 800(8.9)  1900(21)   Weight (kg) 90.3 90.3 90.3 90.3      Current Medications:  Scheduled Meds:   . sodium chloride  flush       . famotidine   40 mg Oral Nightly   . promethazine   6.25 mg Oral q8h   . budesonide   0.25 mg Nebulization BID RT   . prenatal vitamin  1 tablet Oral Daily   . FLUoxetine   30 mg Oral Daily   . albuterol   2.5 mg Nebulization TID RT     Continuous Infusions:   . lactated ringers  IV soln 100 mL/hr at 11/17/22 9365       Assessment and Plan:  Principal Problem:    Respiratory infection  Resolved Problems:    * No resolved hospital problems. *      Plan by  Problem  Hypoxia  Rhinovirus  Bronchiolitis/wheezing  Presents with respiratory symptoms, noted to be rhinovirus positive  Mild hypoxia, still requiring 1 L NC  Echo with normal LVEF  Does have some wheezes today, likely bronchiolitis. Will place on oral steroids. Did receive dose of Betamethasone  yesterday and today  Continue Pulmicort , albuterol   Discussed with Dr. Wonda  Wean O2 as able    History of tobacco abuse  Quit a little over a year ago.    Hospitalist service will continue to follow.    Code Status: Full code  Prophylaxis: SCDs  FEN: ADULT DIET; Regular. LR at 100 mL/hr  Dispo: Continue hospitalization for hypoxia    Discussed with Dr. Wonda    Discussed with care team including nursing, CM.    All relevant labs and images were personally and independently reviewed.    PCP: Luwana Earnie BRAVO, MD   Medical Decision Maker: No emergency contact information on file.     Norleen Alm Slade, MD  11/17/2022 12:07 PM  [facility] Hospitalist Service    This note was created using voice recognition software and may contain typographic errors missed during final review. The intent is to have a complete and accurate medical record.  As a valued partner in this safety effort, if you have noted factual errors, please complete the Health Information Amendment/Correct Form or call the [facility] Health Information Management Office at [phone].

## 2022-11-17 NOTE — Progress Notes (Signed)
 Patent up and ambulating in hallway with this RN.  Continuous pulse ox between 97 and 99% throughout 15 minute ambulation.  Pt denies SOB or chest discomfort.  Pt returned to bed to eat her lunch.  No complaints at this time.  Continues to maintain >97% pulse ox on room air.  Will continue to monitor.

## 2022-11-18 ENCOUNTER — Encounter

## 2022-11-18 LAB — GROUP B STREP BY NAA: Strep Group B NAA: NEGATIVE

## 2022-11-18 MED ORDER — BENZOCAINE-MENTHOL 15-3.6 MG MT LOZG
LOZENGE | OROMUCOSAL | 1 refills | Status: DC | PRN
Start: 2022-11-18 — End: 2022-12-06

## 2022-11-18 MED ORDER — NALBUPHINE HCL 10 MG/ML IJ SOLN
10 | Freq: Once | INTRAMUSCULAR | Status: AC
Start: 2022-11-18 — End: 2022-11-17

## 2022-11-18 MED ORDER — BENZONATATE 200 MG PO CAPS
200 | ORAL_CAPSULE | Freq: Three times a day (TID) | ORAL | 0 refills | Status: AC | PRN
Start: 2022-11-18 — End: 2022-11-25

## 2022-11-18 MED ORDER — GUAIFENESIN-CODEINE 100-10 MG/5ML PO SOLN
100-10 | Freq: Three times a day (TID) | ORAL | 0 refills | Status: AC | PRN
Start: 2022-11-18 — End: 2022-11-23

## 2022-11-18 MED ORDER — PREDNISONE 10 MG PO TABS
10 | ORAL_TABLET | ORAL | 0 refills | Status: AC
Start: 2022-11-18 — End: 2022-11-23

## 2022-11-18 MED ADMIN — promethazine (PHENERGAN) 6.25 MG/5ML syrup 6.25 mg: 6.25 mg | ORAL | @ 08:00:00 | NDC 70752013812

## 2022-11-18 MED ADMIN — albuterol (PROVENTIL) (2.5 MG/3ML) 0.083% nebulizer solution 2.5 mg: 2.5 mg | RESPIRATORY_TRACT | @ 12:00:00 | NDC 00378827031

## 2022-11-18 MED ADMIN — guaiFENesin-codeine (guaiFENesin AC) 100-10 MG/5ML liquid 5 mL: 5 mL | ORAL | @ 08:00:00 | NDC 00121177505

## 2022-11-18 MED ADMIN — promethazine (PHENERGAN) 6.25 MG/5ML syrup 6.25 mg: 6.25 mg | ORAL | NDC 70752013812

## 2022-11-18 MED ADMIN — predniSONE (DELTASONE) tablet 40 mg: 40 mg | ORAL | @ 12:00:00 | NDC 59746017506

## 2022-11-18 MED ADMIN — guaiFENesin-codeine (guaiFENesin AC) 100-10 MG/5ML liquid 5 mL: 5 mL | ORAL | NDC 00121177505

## 2022-11-18 MED ADMIN — budesonide (PULMICORT) nebulizer suspension 250 mcg: 250 mg | RESPIRATORY_TRACT | @ 02:00:00 | NDC 00487960101

## 2022-11-18 MED ADMIN — albuterol (PROVENTIL) (2.5 MG/3ML) 0.083% nebulizer solution 2.5 mg: 2.5 mg | RESPIRATORY_TRACT | @ 02:00:00 | NDC 00378827031

## 2022-11-18 MED ADMIN — lactated ringers IV soln infusion: INTRAVENOUS | @ 05:00:00 | NDC 00338011704

## 2022-11-18 MED ADMIN — prenatal vitamin (Vitafol-OB) tablet 1 tablet: 1 | ORAL | @ 12:00:00 | NDC 00642007912

## 2022-11-18 MED ADMIN — benzonatate (TESSALON) capsule 200 mg: 200 mg | ORAL | @ 01:00:00 | NDC 42806071401

## 2022-11-18 MED ADMIN — nalbuphine (NUBAIN) injection 5 mg: 5 mg | INTRAVENOUS | @ 02:00:00 | NDC 00409146371

## 2022-11-18 MED ADMIN — budesonide (PULMICORT) nebulizer suspension 250 mcg: 250 mg | RESPIRATORY_TRACT | @ 12:00:00 | NDC 00487960101

## 2022-11-18 MED ADMIN — famotidine (PEPCID) tablet 40 mg: 40 mg | ORAL | NDC 00904719306

## 2022-11-18 MED ADMIN — FLUoxetine (PROZAC) capsule 30 mg: 30 mg | ORAL | @ 12:00:00 | NDC 00904734661

## 2022-11-18 MED ADMIN — ondansetron (ZOFRAN) injection 8 mg: 8 mg | INTRAVENOUS | @ 02:00:00 | NDC 00641607801

## 2022-11-18 MED FILL — GUAIFENESIN-CODEINE 100-10 MG/5ML PO SYRP: 100-10 MG/5ML | ORAL | Qty: 5 | Fill #0

## 2022-11-18 MED FILL — ONDANSETRON HCL 4 MG/2ML IJ SOLN: 4 MG/2ML | INTRAMUSCULAR | Qty: 4

## 2022-11-18 MED FILL — ALBUTEROL SULFATE (2.5 MG/3ML) 0.083% IN NEBU: RESPIRATORY_TRACT | Qty: 3 | Fill #0

## 2022-11-18 MED FILL — BUDESONIDE 0.25 MG/2ML IN SUSP: 0.25 MG/2ML | RESPIRATORY_TRACT | Qty: 2 | Fill #0

## 2022-11-18 MED FILL — NALBUPHINE HCL 10 MG/ML IJ SOLN: 10 MG/ML | INTRAMUSCULAR | Qty: 1

## 2022-11-18 MED FILL — BUDESONIDE 0.25 MG/2ML IN SUSP: 0.25 MG/2ML | RESPIRATORY_TRACT | Qty: 2

## 2022-11-18 MED FILL — VITAFOL-OB PO TABS: ORAL | Qty: 1 | Fill #0

## 2022-11-18 MED FILL — ALBUTEROL SULFATE (2.5 MG/3ML) 0.083% IN NEBU: RESPIRATORY_TRACT | Qty: 3

## 2022-11-18 MED FILL — BENZONATATE 100 MG PO CAPS: 100 MG | ORAL | Qty: 2

## 2022-11-18 MED FILL — GUAIFENESIN-CODEINE 100-10 MG/5ML PO SYRP: 100-10 MG/5ML | ORAL | Qty: 5

## 2022-11-18 MED FILL — FLUOXETINE HCL 10 MG PO CAPS: 10 MG | ORAL | Qty: 1 | Fill #0

## 2022-11-18 MED FILL — PREDNISONE 20 MG PO TABS: 20 MG | ORAL | Qty: 2 | Fill #0

## 2022-11-18 MED FILL — FAMOTIDINE 20 MG PO TABS: 20 MG | ORAL | Qty: 2

## 2022-11-18 NOTE — Care Coordination (Signed)
 Social Assessment (completed by [facility] Navigator)   Initial Evaluation    Date/Time of Evaluation: 11/18/2022 11:40 AM  Assessment Completed by: Eleanor Angle      Patient Name: [name] [name]                   Date of Birth: 13-Dec-1985  Diagnosis: Shortness of breath [R06.02]  Respiratory infection [J98.8]  Rhinovirus infection [B34.8]                   Date / Time: 11/15/2022 11:29 AM  Current Financial resources:  Dad will be supporting the family.    Medicaid Navigator contacted pt to discuss resources.   MN introduced self, role, and reason for the visit.     Reason for consult: Resources    Present in the room: [name] [name]        Baby's name: Has not yet given birth    Other children: One 52 year old    Who Lives in the home: [name] [name], dad and 16 year old child (baby when born)    Were you receiving WIC prior to admission: Yes    Baby has a safe place to sleep: Yes    Baby has a car seat:Yes    Other needs: None at this time    Pediatrician:  Not selected - baby has not yet been born    Do you have Transportation :  No - used Medicaid for transportation to and from appointments.    Support System: Baby's father and his family    Any previous DSS cases: No    Medicaid Navigator CM met with pt to discuss resource needs at discharge.    Pt currently reports they do not smoke or use tobacco.   Pt reports they do not drink alcohol.   Pt reports recreational drug use. No  Pt reports they have not received treatment for drug/alcohol use.  Pt is not currently employed. Pt reports current source of income is Dad is employed full time and will be supporting the family.  Pt does not currently receive food assistance. Pt is waiting on re-certification letter.  Pt current mode of transportation is Medicaid for appointments.    Any history of Mental Health Diagnosis or concerns: Yes   Do you have a counselor/psychiatrist:  Yes   Current medications: Prozac , clonazepam     I spoke with [name] [name] via phone call in the room. [name] [name] has all necessary  supplies and support at home. They have transport and are able to follow up with MD. Justice has had prenatal care and is scheduled for a c-section on December 04, 2022. Provided contact information should she need resources. Will follow up.                                    Resources provided - discussed 211, transportation, post partum depression, SNAP re-certification, bee collective/Support given.                                                                    Pt reports no safety concerns. There are no further identified risk factors for the baby at this time.  Eleanor Angle  [employer]

## 2022-11-18 NOTE — Discharge Summary (Signed)
 Discharge Summary    Date: 11/18/2022  Patient Name: [name] [name]    Date of Birth: 03-21-1985     Age: 37 y.o.    Admit Date: 11/15/2022  Discharge Date: 11/18/2022  Discharge Condition: Good    Admission Diagnosis  Shortness of breath [R06.02];Respiratory infection [J98.8];Rhinovirus infection [B34.8]      Discharge Diagnosis  Principal Problem:    Respiratory infection  Active Problems:    Rhinovirus infection  Resolved Problems:    * No resolved hospital problems. [facility] Stay  Narrative of Hospital Course:  Patient was admitted at [redacted] weeks pregnant with severe respiratory infection requiring oxygen supplementation and nebulizer treatments.  After several days her sats improved no longer requiring oxygen and feeling much better although she still has coughing spells.  Afebrile, followed by hospitalist on a steroid taper.  Feels well enough to go home later today, normal fetal movement and no specific obstetric concerns    Consultants:  IP CONSULT TO HOSPITALIST    Surgeries/procedures Performed:      Treatments:    Steroids, IV Hydration and Respiratory Therapy    O2 and Albuterol /Atropine Nebulizer, Prednisone     Discharge Plan/Disposition:  Home    Hospital/Incidental Findings Requiring Follow Up:    Patient Instructions:    Diet: Regular Diet    Activity:Activity as Tolerated  For number of days (if applicable):      Other Instructions:    Provider Follow-Up:   No follow-ups on file.     Significant Diagnostic Studies:    Recent Labs:  Admission on 11/15/2022  SARS-CoV-2, Rapid                             Date: 11/15/2022  Value: Not Detected                     Ref range: Not Detected       Status: Final                Comment: The Xpert Xpress SARS-CoV-2/Flu/RSV test is a rapid, multiplexed real-time  RT-PCR test intended for the simultaneous qualitative detection and  differentiation of SARS-CoV-2, influenza A, influenza B, and respiratory  syncytial virus (RSV) viral RNA in a nasopharyngeal swab  specimen collected  from individuals suspected of suspected of respiratory viral infection  consistent with COVID-19 by their healthcare provider.    Detected/Positive results are indicative of the presence of SARS-CoV-2,  influenza A virus, influenza B virus and RSV RNA; clinical correlation with  patient history and other diagnostic information is necessary to determine  patient infection status. Positive results to not rule out bacterial infection  or co-infection with other viruses. The agent detected may not be the definite  cause of disease.    Not Detected/Negative results do not preclude SARS-C0V-2, influenza A virus,  influenza B virus and/or RSV infection and should not be used as the sole  basis for pati                         ent management decisions. Negative results must be combined with  clinical observations, patient history, and epidemiological information.    The Xpert Xpress SARS-CoV-2/Flu/RSV test is only for use under the Food and  Drug Administration's Emergency Use Authorization.    INFLU A, RAPID, FLUA  Date: 11/15/2022  Value: Not Detected                     Ref range: Not Detected       Status: Final  INFLU B, RAPID, FLUB                          Date: 11/15/2022  Value: Not Detected                     Ref range: Not Detected       Status: Final  RSV By PCR                                    Date: 11/15/2022  Value: Not Detected                     Ref range: Not Detected       Status: Final  WBC                                           Date: 11/15/2022  Value: 10.4        Ref range: 3.8 - 10.6 x10e3*  Status: Final  RBC                                           Date: 11/15/2022  Value: 3.82        Ref range: 3.60 - 5.20 x10e*  Status: Final  Hemoglobin                                    Date: 11/15/2022  Value: 12.5        Ref range: 11.5 - 15.7 g/dL   Status: Final  Hematocrit                                    Date: 11/15/2022  Value: 36.7        Ref range:  34.0 - 47.0 %      Status: Final  MCV                                           Date: 11/15/2022  Value: 96.1        Ref range: 81.0 - 99.0 fL     Status: Final  MCH                                           Date: 11/15/2022  Value: 32.7        Ref range: 27.0 - 34.5 pg     Status: Final  MCHC  Date: 11/15/2022  Value: 34.1        Ref range: 30.0 - 36.0 g/dL   Status: Final  RDW                                           Date: 11/15/2022  Value: 12.4        Ref range: 10.0 - 17.0 %      Status: Final  Platelets                                     Date: 11/15/2022  Value: 280         Ref range: 140 - 440 x10e3/*  Status: Final  MPV                                           Date: 11/15/2022  Value: 8.9         Ref range: 7.0 - 12.2 fL      Status: Final  Neutrophils %                                 Date: 11/15/2022  Value: 69.0        Ref range: 42.0 - 74.0 %      Status: Final  Lymphocytes                                   Date: 11/15/2022  Value: 18.6        Ref range: 15.0 - 45.0 %      Status: Final  Monocytes %                                   Date: 11/15/2022  Value: 8.5         Ref range: 4.0 - 12.0 %       Status: Final  Eosinophils %                                 Date: 11/15/2022  Value: 2.5         Ref range: 0.0 - 7.0 %        Status: Final  Basophils %                                   Date: 11/15/2022  Value: 0.3         Ref range: 0.0 - 2.0 %        Status: Final  Neutrophils Absolute                          Date: 11/15/2022  Value: 7.1         Ref range: 1.6 - 7.3 x10e3/*  Status: Final  Lymphocytes Absolute  Date: 11/15/2022  Value: 1.9         Ref range: 1.0 - 3.2 x10e3/*  Status: Final  Monocytes Absolute                            Date: 11/15/2022  Value: 0.9         Ref range: 0.3 - 1.0 x10e3/*  Status: Final  Eosinophils Absolute                          Date: 11/15/2022  Value: 0.3         Ref range: 0.0 - 0.5 x10e3/*  Status:  Final  Basophils Absolute                            Date: 11/15/2022  Value: 0.0         Ref range: 0.0 - 0.2 x10e3/*  Status: Final  Immature Granulocytes %                       Date: 11/15/2022  Value: 1.1 (H)     Ref range: 0.0 - 0.6 %        Status: Final  Immature Grans (Abs)                          Date: 11/15/2022  Value: 0.11 (H)    Ref range: 0.00 - 0.06 x10e*  Status: Final  Sodium                                        Date: 11/15/2022  Value: 140         Ref range: 135 - 145 mmol/L   Status: Final  Potassium                                     Date: 11/15/2022  Value: 4.5         Ref range: 3.5 - 5.3 mmol/L   Status: Final  Chloride                                      Date: 11/15/2022  Value: 107         Ref range: 98 - 107 mmol/L    Status: Final  CO2                                           Date: 11/15/2022  Value: 22          Ref range: 22 - 29 mmol/L     Status: Final  Glucose                                       Date: 11/15/2022  Value: 83          Ref range: 70 - 99 mg/dL      Status: Final  BUN                                           Date: 11/15/2022  Value: 5 (L)       Ref range: 6 - 20 mg/dL       Status: Final  Creatinine                                    Date: 11/15/2022  Value: 0.6         Ref range: 0.5 - 1.0 mg/dL    Status: Final  Anion Gap                                     Date: 11/15/2022  Value: 11          Ref range: 2 - 17 mmol/L      Status: Final  Osmolaliy Calculated                          Date: 11/15/2022  Value: 276         Ref range: 270 - 287 mOsm/kg  Status: Final  Calcium                                       Date: 11/15/2022  Value: 8.8         Ref range: 8.5 - 10.7 mg/dL   Status: Final  Total Protein                                 Date: 11/15/2022  Value: 6.3         Ref range: 5.7 - 8.3 g/dL     Status: Final  Albumin                                       Date: 11/15/2022  Value: 3.5         Ref range: 3.5 - 5.2 g/dL     Status: Final  Globulin                                       Date: 11/15/2022  Value: 2.7         Ref range: 1.9 - 4.4 g/dL     Status: Final  Albumin/Globulin Ratio                        Date: 11/15/2022  Value: 1.00        Ref range: 1.00 - 2.70        Status: Final  Total Bilirubin                               Date: 11/15/2022  Value: <0.15       Ref range: 0.00 - 1.20  mg/dL  Status: Final  Alk Phosphatase                               Date: 11/15/2022  Value: 131 (H)     Ref range: 35 - 117 unit/L    Status: Final  AST                                           Date: 11/15/2022  Value: 27          Ref range: 0 - 35 unit/L      Status: Final  ALT                                           Date: 11/15/2022  Value: 25          Ref range: 0 - 35 unit/L      Status: Final  Est, Glom Filt Rate                           Date: 11/15/2022  Value: 120         Ref range: >=60 mL/min/1.73*  Status: Final                Comment: VERIFIED by Discern Expert.  GFR Interpretation:                                                                         % OF  KIDNEY  GFR                                                        STAGE  FUNCTION  ==================================================================================    > 90        Normal kidney function                       STAGE 1  90-100%  89 to 60      Mild loss of kidney function                 STAGE 2  80-60%  59 to 45      Mild to moderate loss of kidney function     STAGE 3a  59-45%  44 to 30      Moderate to severe loss of kidney function   STAGE 3b  44-30%  29 to 15      Severe loss of kidney function               STAGE 4  29-15%    < 15        Kidney failure  STAGE 5  <15%  ==================================================================================  Modified from [employer]    GFR Calculation performed using the CKD-EPI 2021 equation developed for use  with IDMS traceable creatinine methods and                          is the calculation  recommended by  the [facility] for estimating GFR in adults.    Adenovirus                                    Date: 11/15/2022  Value: Not Detected                     Ref range: Not Detected       Status: Final  Coronavirus 299E                              Date: 11/15/2022  Value: Not Detected                     Ref range: Not Detected       Status: Final  Coronavirus HKU1                              Date: 11/15/2022  Value: Not Detected                     Ref range: Not Detected       Status: Final  Coronavirus NL63                              Date: 11/15/2022  Value: Not Detected                     Ref range: Not Detected       Status: Final  Coronavirus OC43                              Date: 11/15/2022  Value: Not Detected                     Ref range: Not Detected       Status: Final  Human Metapneumovirus                         Date: 11/15/2022  Value: Not Detected                     Ref range: Not Detected       Status: Final  Human Rhinovirus/Enterovirus                  Date: 11/15/2022  Value: Detected (A) Ref range: Not Detected       Status: Final  Influenza A                                   Date: 11/15/2022  Value: Not Detected                     Ref range: Not  Detected       Status: Final  Influenza B                                   Date: 11/15/2022  Value: Not Detected                     Ref range: Not Detected       Status: Final  Parainfluenza 1                               Date: 11/15/2022  Value: Not Detected                     Ref range: Not Detected       Status: Final  Parainfluenza 2                               Date: 11/15/2022  Value: Not Detected                     Ref range: Not Detected       Status: Final  Parainfluenza 3                               Date: 11/15/2022  Value: Not Detected                     Ref range: Not Detected       Status: Final  Parainfluenza                                 Date: 11/15/2022  Value: Not Detected                      Ref range: Not Detected       Status: Final  Respiratory Syncytial Virus                   Date: 11/15/2022  Value: Not Detected                     Ref range: Not Detected       Status: Final  Bordetella Pertussis                          Date: 11/15/2022  Value: Not Detected                     Ref range: Not Detected       Status: Final  Chlamydia Pneumoniae                          Date: 11/15/2022  Value: Not Detected                     Ref range: Not Detected       Status: Final  Mycoplasma pneumoniae                         Date: 11/15/2022  Value: Not Detected  Ref range: Not Detected       Status: Final  SARS-CoV-2                                    Date: 11/15/2022  Value: Not Detected                     Ref range: Not Detected       Status: Final  Bordetella Parapertussis                      Date: 11/15/2022  Value: Not Detected                     Ref range: Not Detected       Status: Final  LV EDV A2C                                    Date: 11/15/2022  Value: 44          Ref range: mL                 Status: Final  LV EDV A4C                                    Date: 11/15/2022  Value: 86          Ref range: mL                 Status: Final  LV ESV A2C                                    Date: 11/15/2022  Value: 14          Ref range: mL                 Status: Final  LV ESV A4C                                    Date: 11/15/2022  Value: 12          Ref range: mL                 Status: Final  IVSd                                          Date: 11/15/2022  Value: 1.0 (A)     Ref range: 0.6 - 0.9 cm       Status: Final  LVIDd                                         Date: 11/15/2022  Value: 4.7         Ref range: 3.9 - 5.3 cm       Status: Final  LVIDs  Date: 11/15/2022  Value: 2.7         Ref range: cm                 Status: Final  LVPWd                                         Date: 11/15/2022  Value: 0.9         Ref range: 0.6 - 0.9 cm        Status: Final  LV Ejection Fraction A2C                      Date: 11/15/2022  Value: 69          Ref range: %                  Status: Final  LV Ejection Fraction A4C                      Date: 11/15/2022  Value: 86          Ref range: %                  Status: Final  EF BP                                         Date: 11/15/2022  Value: 79          Ref range: 55 - 100 %         Status: Final  Body Surface Area                             Date: 11/15/2022  Value: 2           Ref range: m2                 Status: Final  Fractional Shortening 2D                      Date: 11/15/2022  Value: 43          Ref range: 28 - 44 %          Status: Final  LV ESV Index A4C                              Date: 11/15/2022  Value: 6           Ref range: mL/m2              Status: Final  LV EDV Index A4C                              Date: 11/15/2022  Value: 45          Ref range: mL/m2              Status: Final  LV ESV Index A2C                              Date: 11/15/2022  Value: 7  Ref range: mL/m2              Status: Final  LV EDV Index A2C                              Date: 11/15/2022  Value: 23          Ref range: mL/m2              Status: Final  LVIDd Index                                   Date: 11/15/2022  Value: 2.44        Ref range: cm/m2              Status: Final  LVIDs Index                                   Date: 11/15/2022  Value: 1.40        Ref range: cm/m2              Status: Final  LV RWT Ratio                                  Date: 11/15/2022  Value: 0.38          Status: Final  LV Mass 2D                                    Date: 11/15/2022  Value: 153.4       Ref range: 67 - 162 g         Status: Final  LV Mass 2D Index                              Date: 11/15/2022  Value: 79.5        Ref range: 43 - 95 g/m2       Status: Final  ------------    Radiology last 7 days:  XR CHEST PORTABLE    Result Date: 11/15/2022  Shallow lung volumes. Suspect vascular congestion, no overt CHF, no focal  infiltrate.       @DCPENDLAB @    Discharge Medications    Current Discharge Medication List        Current Discharge Medication List        Current Discharge Medication List    CONTINUE these medications which have NOT CHANGED    FLUoxetine  (PROZAC ) 10 MG capsule  Take 3 capsules by mouth daily Please give 20mg  tablet and 10 mg tablet for total of 30mg   Qty: 90 capsule Refills: 0  Associated Diagnoses:Anxiety    clonazePAM  (KLONOPIN ) 1 MG tablet  Take 1 tablet by mouth in the morning and at bedtime for 30 days. Max Daily Amount: 2 mg  Qty: 60 tablet Refills: 0  Associated Diagnoses:Anxiety    ondansetron  (ZOFRAN -ODT) 4 MG disintegrating tablet  Place 1 tablet under the tongue every 8 hours as needed for Nausea or Vomiting  Qty: 30 tablet Refills: 3    famotidine  (PEPCID ) 40 MG tablet  Take 1 tablet by mouth every  evening  Qty: 30 tablet Refills: 3    Prenatal Vit-Fe Fumarate-FA (PRENATAL VITAMINS) 28-0.8 MG TABS  Take 1 tablet by mouth daily  Qty: 30 tablet Refills: 0    aluminum  & magnesium  hydroxide-simethicone  (MAALOX) 200-200-20 MG/5ML SUSP suspension  Take 30 mLs by mouth every 6 hours as needed for Indigestion  Qty: 335 mL Refills: 2    lidocaine  viscous hcl (XYLOCAINE ) 2 % SOLN solution  Take 15 mLs by mouth every 3 hours as needed for Irritation  Qty: 100 mL Refills: 3    sucralfate  (CARAFATE ) 1 GM tablet  Take 1 tablet by mouth every 12 hours Administer on an empty stomach. Do not administer antacids within 30 minutes of administration of sucralfate . Do not crush sucralfate  tablets. Tablets may be cut in half to help facilitate oral intake.  Qty: 60 tablet Refills: 1          Current Discharge Medication List        Time Spent on Discharge:  30 minutes were spent in patient examination, evaluation, counseling as well as medication reconciliation, prescriptions for required medications, discharge plan, and follow up.    Electronically signed by Powell GORMAN Seitz, MD on 11/18/22 at 9:49 AM EDT

## 2022-11-18 NOTE — Discharge Instructions (Addendum)
 [email or web address]  Our Mission      The mission of the [employer] [facility] is to ensure all families are provided with access to quality maternal, child, and family health care.  Contact Info  Address:[address], [place], [place] [address]  Phone:[phone]  Email:[email or web address]      WOMEN, INFANTS AND CHILDREN [facility]) NUTRITION PROGRAM [phone] [email or web address] - WIC is a nutrition program that provides health education, healthy foods, breastfeeding support, and other services free of charge to [place]  families who qualify.    [facility]   We are here to help. Get in touch to talk through your feelings and symptoms - from there, we can connect you with a mom mentor and introduce you to the best resources in the community. Please reach out to us  at our warm line: [phone] or email: [email or web address].      [facility] - [email or web address] [phone] (multiple locations in [facility] for distribution)    [employer] - [phone]

## 2022-11-18 NOTE — Progress Notes (Signed)
 [name]     Hospitalist Progress Note     PCP: Luwana Earnie BRAVO, MD   Admission Date: 11/15/2022    Admitted from: home    Summary:  37 year old G2 P1-0-0-1 at 35+w admitted 9/13 for dyspnea, hypoxia and found to have rhinovirus.     Subjective     Feeling better today overall. Dressed and sitting at bedside chair. Still with significant cough, breathing easily. On room air. Afebrile. Tolerating p.o. well.     Physical Exam     Vitals: BP 106/76   Pulse 77   Temp 97.8 F (36.6 C) (Oral)   Resp 16   Ht 1.6 m (5' 3)   Wt 90.3 kg (199 lb)   LMP 03/11/2022   SpO2 100%   BMI 35.25 kg/m Temp (24hrs), Avg:98.2 F (36.8 C), Min:97.8 F (36.6 C), Max:98.6 F (37 C)    General: NAD  HEENT: AT/NC, mmm  Lungs: Nonlabored, symmetric. CTAB. No wheezes or crackles  Abdomen: Positive BS, gravid, nontender  Neuro: Awake, alert, no focal deficits     Labs and Imaging     Data: I have personally reviewed all lab results and independently reviewed imaging studies performed in the past 24 hours.    Hematologic/Coags Chemistries   Recent Labs     11/15/22  1153   WBC 10.4   HGB 12.5   HCT 36.7   PLT 280     Lab Results   Component Value Date/Time    ALBUMIN 3.5 11/15/2022 11:53 AM     No components found for: HGBA1C  Lab Results   Component Value Date/Time    INR 1.0 10/13/2022 01:48 PM    PROTIME 13.3 10/13/2022 01:48 PM     Lab Results   Component Value Date/Time    APTT 26.4 10/13/2022 01:48 PM     Lab Results   Component Value Date/Time    DDIMER 0.34 04/21/2022 05:23 PM      Recent Labs     11/15/22  1153   NA 140   K 4.5   CL 107   CO2 22   BUN 5*   CREATININE 0.6   ALBUMIN 3.5   BILITOT <0.15   ALKPHOS 131*   AST 27   ALT 25     No results for input(s): GLU in the last 72 hours.  No results found for: CPK, CKMB, TROPONINI  No results found for: IRON, FERRITIN     Inflammatory/Respiratory Diabetes   No results found for: CRP  No results found for: ESR  ABGs:  No results found for:  PHART, PO2ART, HCO3, PCO2ART   Lab Results   Component Value Date/Time    CREATININE 0.6 11/15/2022 11:53 AM              Microbiology:  UA: No results found for: UA  Gram Stain: No results found for: LABGRAM   Cultures : No results for input(s): ORG in the last 72 hours. No results found for: [name]    Radiology:  No results found.  11/15/22    ECHO (TTE) LIMITED (PRN CONTRAST/BUBBLE/STRAIN/3D) 11/15/2022  3:28 PM (Final)    Interpretation Summary    Left Ventricle: Normal left ventricular systolic function with a visually estimated EF of 70 - 75%. Left ventricle size is normal. Normal wall thickness. Normal wall motion. Normal diastolic function.    Right Ventricle: Right ventricle size is normal. Normal systolic function.    Valves: No hemodynamically significant valve  dysfunction.    Image quality is adequate. Limited Doppler study due to patient's condition and limited study due to patient's ability to tolerate test.    Signed by: Reyes Cava, MD on 11/15/2022  3:28 PM        I/O:  Date 11/18/22 0000 - 11/18/22 2359   Shift 9999-9240 9199-8440 8399-7640 24 Hour Total   INTAKE   I.V.(mL/kg) 800(8.9)   800(8.9)   Shift Total(mL/kg) 800(8.9)   800(8.9)   OUTPUT   Shift Total(mL/kg)       Weight (kg) 90.3 90.3 90.3 90.3      Current Medications:  Scheduled Meds:    famotidine   40 mg Oral Nightly    [START ON 11/19/2022] predniSONE   20 mg Oral Daily    Followed by    NOREEN ON 11/21/2022] predniSONE   10 mg Oral Daily    sodium chloride  flush  5-40 mL IntraVENous 2 times per day    budesonide   0.25 mg Nebulization BID RT    prenatal vitamin  1 tablet Oral Daily    FLUoxetine   30 mg Oral Daily     Continuous Infusions:    sodium chloride       lactated ringers  IV soln 100 mL/hr at 11/18/22 0101       Assessment and Plan:  Principal Problem:    Respiratory infection  Active Problems:    Rhinovirus infection  Resolved Problems:    * No resolved hospital problems. *      Plan by  Problem  Hypoxia  Rhinovirus  Bronchiolitis/wheezing  Presents with respiratory symptoms, noted to be rhinovirus positive  Mild hypoxia, was on 1 to 2 L NC, now on room air.  Echo with normal LVEF  Wheezes have resolved with steroids, will continue on very short course of oral steroids at discharge. No history of asthma/RAD  Improving. Expect that this will run its course and be self-limited. Discussed with patient.    History of tobacco abuse  Quit a little over a year ago.    Code Status: Full code  Prophylaxis: SCDs  FEN: ADULT DIET; Regular.   Dispo: Medically stable for discharge home from my perspective. Please reach out with any questions or concerns.    Discussed with care team including nursing, CM.    All relevant labs and images were personally and independently reviewed.    PCP: Luwana Earnie BRAVO, MD   Medical Decision Maker: No emergency contact information on file.     Norleen Alm Slade, MD  11/18/2022 11:04 AM  [name]ist [facility]    This note was created using voice recognition software and may contain typographic errors missed during final review. The intent is to have a complete and accurate medical record.  As a valued partner in this safety effort, if you have noted factual errors, please complete the Health Information Amendment/Correct Form or call the [facility] Health Information Management Office at [phone].

## 2022-11-21 ENCOUNTER — Ambulatory Visit: Admit: 2022-11-21 | Discharge: 2022-11-21 | Payer: MEDICAID | Primary: Family Medicine

## 2022-11-21 ENCOUNTER — Encounter

## 2022-11-21 ENCOUNTER — Encounter
Admit: 2022-11-21 | Discharge: 2022-11-21 | Payer: MEDICAID | Attending: Obstetrics & Gynecology | Primary: Family Medicine

## 2022-11-21 DIAGNOSIS — O365931 Maternal care for other known or suspected poor fetal growth, third trimester, fetus 1: Secondary | ICD-10-CM

## 2022-11-21 DIAGNOSIS — O365930 Maternal care for other known or suspected poor fetal growth, third trimester, not applicable or unspecified: Secondary | ICD-10-CM

## 2022-11-21 MED ORDER — PROMETHAZINE HCL 25 MG PO TABS
25 MG | ORAL_TABLET | Freq: Four times a day (QID) | ORAL | 1 refills | Status: DC | PRN
Start: 2022-11-21 — End: 2022-12-06

## 2022-11-21 NOTE — Telephone Encounter (Signed)
Called patient and left message for return call at her convenience to discuss her concerns, advised [facility] for any severe concerns

## 2022-11-21 NOTE — Progress Notes (Signed)
 RH neg: s/p rogham @ 28 wks  PNLS wnl  unity wnl  Too late for afp  Previous c/s x1  Anxiety: on klonopin refilled  Depression: prozac  Anatomy wnl  IUGR: EFW now 21%tile. BPP wnl  Uterine window: delivery @ 39wks. No more plan for TOLAC. Continue to monitor possible uterine window  Tdap s/p  S/p rhinovirus: hospitalization. Doing better  Nuasea;: Phenergan

## 2022-11-21 NOTE — Telephone Encounter (Signed)
Pt is 37 weeks and is vomiting, diarrhea stomach tight and dizziness. Pt has an appt today at 11:00. Pt would like to know if she needs to go to the ED or what to do?

## 2022-11-28 ENCOUNTER — Ambulatory Visit: Admit: 2022-11-28 | Discharge: 2022-11-28 | Payer: MEDICAID | Primary: Family Medicine

## 2022-11-28 ENCOUNTER — Encounter
Admit: 2022-11-28 | Discharge: 2022-11-28 | Payer: MEDICAID | Attending: Obstetrics & Gynecology | Primary: Family Medicine

## 2022-11-28 ENCOUNTER — Encounter

## 2022-11-28 VITALS — BP 106/72 | Ht 63.0 in | Wt 195.4 lb

## 2022-11-28 DIAGNOSIS — O365930 Maternal care for other known or suspected poor fetal growth, third trimester, not applicable or unspecified: Secondary | ICD-10-CM

## 2022-11-28 DIAGNOSIS — O365931 Maternal care for other known or suspected poor fetal growth, third trimester, fetus 1: Secondary | ICD-10-CM

## 2022-11-28 NOTE — Addendum Note (Signed)
Addended by: Junius Finner on: 12/06/2022 07:51 AM     Modules accepted: Orders

## 2022-11-28 NOTE — Progress Notes (Signed)
 RH neg: s/p rogham @ 28 wks  PNLS wnl  unity wnl  Too late for afp  Previous c/s x1  Anxiety: on klonopin  refilled  Depression: prozac   Anatomy wnl  IUGR: EFW now 21%tile. BPP wnl  Uterine window: delivery @ 39wks. No more plan for TOLAC. Continue to monitor possible uterine window  Tdap s/p  S/p rhinovirus: hospitalization. Doing better  Nuasea;: Phenergan 

## 2022-11-28 NOTE — Progress Notes (Signed)
Refill given for Klonapin and prozac today

## 2022-12-04 ENCOUNTER — Inpatient Hospital Stay
Admit: 2022-12-04 | Discharge: 2022-12-07 | Disposition: A | Discharge status: Home / Self Care | Payer: MEDICAID | Attending: Obstetrics & Gynecology | Admitting: Obstetrics & Gynecology

## 2022-12-04 DIAGNOSIS — O34219 Maternal care for unspecified type scar from previous cesarean delivery: Secondary | ICD-10-CM

## 2022-12-04 DIAGNOSIS — O34211 Maternal care for low transverse scar from previous cesarean delivery: Secondary | ICD-10-CM

## 2022-12-04 DIAGNOSIS — F418 Other specified anxiety disorders: Secondary | ICD-10-CM

## 2022-12-04 LAB — URINE DRUG SCREEN
Amphetamine Screen, Ur: NEGATIVE
Barbiturate Screen, Ur: NEGATIVE
Benzodiazepine Screen, Urine: POSITIVE — AB
Cannabinoid Scrn, Ur: POSITIVE — AB
Cocaine Screen Urine: NEGATIVE
Fentanyl, Ur: NEGATIVE
Opiate Screen, Urine: NEGATIVE

## 2022-12-04 LAB — ANTIBODY SCREEN: Antibody Screen: POSITIVE

## 2022-12-04 LAB — CBC
Hematocrit: 33.5 % — ABNORMAL LOW (ref 34.0–47.0)
Hemoglobin: 11.4 g/dL — ABNORMAL LOW (ref 11.5–15.7)
MCH: 32.9 pg (ref 27.0–34.5)
MCHC: 34 g/dL (ref 30.0–36.0)
MCV: 96.8 fL (ref 81.0–99.0)
MPV: 9.2 fL (ref 7.0–12.2)
Platelets: 315 10*3/uL (ref 140–440)
RBC: 3.46 x10e6/mcL — ABNORMAL LOW (ref 3.60–5.20)
RDW: 12.4 % (ref 10.0–17.0)
WBC: 9.5 10*3/uL (ref 3.8–10.6)

## 2022-12-04 LAB — ABO/RH: ABO/Rh: B NEG

## 2022-12-04 LAB — ANTIBODY IDENTIFICATION: Antibody ID: POSITIVE

## 2022-12-04 MED ORDER — LACTATED RINGERS IV BOLUS
Freq: Once | INTRAVENOUS | Status: AC
Start: 2022-12-04 — End: 2022-12-04

## 2022-12-04 MED ORDER — ACETAMINOPHEN 500 MG PO TABS
500 | ORAL | Status: DC | PRN
Start: 2022-12-04 — End: 2022-12-07
  Administered 2022-12-05 – 2022-12-07 (×7): 1000 mg via ORAL

## 2022-12-04 MED ORDER — KETOROLAC TROMETHAMINE 30 MG/ML IJ SOLN
30 | Freq: Four times a day (QID) | INTRAMUSCULAR | Status: AC
Start: 2022-12-04 — End: 2022-12-05

## 2022-12-04 MED ORDER — KETOROLAC TROMETHAMINE 30 MG/ML IJ SOLN
30 | INTRAMUSCULAR | Status: AC
Start: 2022-12-04 — End: ?

## 2022-12-04 MED ORDER — ONDANSETRON HCL 4 MG/2ML IJ SOLN
4 | INTRAMUSCULAR | Status: AC
Start: 2022-12-04 — End: ?

## 2022-12-04 MED ORDER — OXYTOCIN 30 UNITS IN 500 ML INFUSION
30 | INTRAVENOUS | Status: AC | PRN
Start: 2022-12-04 — End: 2022-12-06

## 2022-12-04 MED ORDER — IBUPROFEN 800 MG PO TABS
800 | Freq: Three times a day (TID) | ORAL | Status: DC
Start: 2022-12-04 — End: 2022-12-07
  Administered 2022-12-05 – 2022-12-07 (×5): 800 mg via ORAL

## 2022-12-04 MED ORDER — KETOROLAC TROMETHAMINE 30 MG/ML IJ SOLN
30 | Freq: Once | INTRAMUSCULAR | Status: DC | PRN
Start: 2022-12-04 — End: 2022-12-04

## 2022-12-04 MED ORDER — BUPIVACAINE IN DEXTROSE 0.75-8.25 % IT SOLN
0.75-8.25 | INTRATHECAL | Status: AC
Start: 2022-12-04 — End: 2022-12-04

## 2022-12-04 MED ORDER — NORMAL SALINE FLUSH 0.9 % IV SOLN
0.9 | Freq: Two times a day (BID) | INTRAVENOUS | Status: DC
Start: 2022-12-04 — End: 2022-12-04

## 2022-12-04 MED ORDER — SODIUM CHLORIDE 0.9 % IV SOLN
0.9 | INTRAVENOUS | Status: DC | PRN
Start: 2022-12-04 — End: 2022-12-04

## 2022-12-04 MED ORDER — SENNA-DOCUSATE SODIUM 8.6-50 MG PO TABS
Freq: Every day | ORAL | Status: DC
Start: 2022-12-04 — End: 2022-12-07
  Administered 2022-12-06: 13:00:00 1 via ORAL

## 2022-12-04 MED ORDER — FAMOTIDINE (PF) 20 MG/2ML IV SOLN
20 | Freq: Once | INTRAVENOUS | Status: DC
Start: 2022-12-04 — End: 2022-12-04

## 2022-12-04 MED ORDER — RHO D IMMUNE GLOBULIN 1500 UNITS IM (WRAPPER)
1500 | Freq: Once | Status: AC
Start: 2022-12-04 — End: 2022-12-05
  Administered 2022-12-05: 13:00:00 300 ug via INTRAMUSCULAR

## 2022-12-04 MED ORDER — NORMAL SALINE FLUSH 0.9 % IV SOLN
0.9 | INTRAVENOUS | Status: DC | PRN
Start: 2022-12-04 — End: 2022-12-07

## 2022-12-04 MED ORDER — SODIUM CHLORIDE (PF) 0.9 % IJ SOLN
0.9 | Freq: Once | INTRAMUSCULAR | Status: AC
Start: 2022-12-04 — End: 2022-12-04

## 2022-12-04 MED ORDER — NALOXONE HCL 0.4 MG/ML IJ SOLN
0.4 | INTRAMUSCULAR | Status: DC | PRN
Start: 2022-12-04 — End: 2022-12-07

## 2022-12-04 MED ORDER — LANOLIN-PETROLATUM 15.5-53.4 % EX OINT
CUTANEOUS | Status: DC | PRN
Start: 2022-12-04 — End: 2022-12-07

## 2022-12-04 MED ORDER — LACTATED RINGERS IV SOLN
INTRAVENOUS | Status: DC | PRN
Start: 2022-12-04 — End: 2022-12-04

## 2022-12-04 MED ORDER — CARBOPROST TROMETHAMINE 250 MCG/ML IM SOLN
250 | INTRAMUSCULAR | Status: DC | PRN
Start: 2022-12-04 — End: 2022-12-07

## 2022-12-04 MED ORDER — OXYTOCIN 30 UNITS IN 500 ML INFUSION
30 | INTRAVENOUS | Status: AC
Start: 2022-12-04 — End: ?

## 2022-12-04 MED ORDER — ONDANSETRON HCL 4 MG/2ML IJ SOLN
4 | Freq: Four times a day (QID) | INTRAMUSCULAR | Status: DC | PRN
Start: 2022-12-04 — End: 2022-12-07

## 2022-12-04 MED ORDER — MORPHINE SULFATE (PF) 1 MG/ML IJ SOLN
1 | INTRAMUSCULAR | Status: AC
Start: 2022-12-04 — End: ?

## 2022-12-04 MED ORDER — ONDANSETRON HCL 4 MG/2ML IJ SOLN
4 | Freq: Once | INTRAMUSCULAR | Status: DC | PRN
Start: 2022-12-04 — End: 2022-12-04

## 2022-12-04 MED ORDER — ONDANSETRON 4 MG PO TBDP
4 | Freq: Three times a day (TID) | ORAL | Status: DC | PRN
Start: 2022-12-04 — End: 2022-12-07

## 2022-12-04 MED ORDER — ACETAMINOPHEN 325 MG PO TABS
325 | Freq: Once | ORAL | Status: AC
Start: 2022-12-04 — End: 2022-12-04

## 2022-12-04 MED ORDER — SIMETHICONE 80 MG PO CHEW
80 | Freq: Four times a day (QID) | ORAL | Status: DC | PRN
Start: 2022-12-04 — End: 2022-12-07

## 2022-12-04 MED ORDER — BUPIVACAINE IN DEXTROSE 0.75-8.25 % IT SOLN
0.75-8.25 | INTRATHECAL | Status: AC
Start: 2022-12-04 — End: ?

## 2022-12-04 MED ORDER — DIPHENHYDRAMINE HCL 25 MG PO TABS
25 | Freq: Four times a day (QID) | ORAL | Status: DC | PRN
Start: 2022-12-04 — End: 2022-12-07

## 2022-12-04 MED ORDER — NORMAL SALINE FLUSH 0.9 % IV SOLN
0.9 | Freq: Two times a day (BID) | INTRAVENOUS | Status: DC
Start: 2022-12-04 — End: 2022-12-07
  Administered 2022-12-05 (×2): 10 mL via INTRAVENOUS

## 2022-12-04 MED ORDER — LACTATED RINGERS IV SOLN
INTRAVENOUS | Status: AC
Start: 2022-12-04 — End: 2022-12-04

## 2022-12-04 MED ORDER — OXYTOCIN 0.06 UNIT/ML BOLUS FROM THE BAG (POST-PARTUM)
30 | INTRAVENOUS | Status: DC | PRN
Start: 2022-12-04 — End: 2022-12-07

## 2022-12-04 MED ORDER — FENTANYL CITRATE (PF) 100 MCG/2ML IJ SOLN
100 | INTRAMUSCULAR | Status: AC
Start: 2022-12-04 — End: ?

## 2022-12-04 MED ORDER — METHYLERGONOVINE MALEATE 0.2 MG/ML IJ SOLN
0.2 | INTRAMUSCULAR | Status: DC | PRN
Start: 2022-12-04 — End: 2022-12-07

## 2022-12-04 MED ORDER — NORMAL SALINE FLUSH 0.9 % IV SOLN
0.9 | INTRAVENOUS | Status: DC | PRN
Start: 2022-12-04 — End: 2022-12-04

## 2022-12-04 MED ORDER — OXYTOCIN 30 UNITS IN 500 ML INFUSION
30 | INTRAVENOUS | Status: DC | PRN
Start: 2022-12-04 — End: 2022-12-04

## 2022-12-04 MED ORDER — LACTATED RINGERS IV SOLN
INTRAVENOUS | Status: DC
Start: 2022-12-04 — End: 2022-12-04

## 2022-12-04 MED ORDER — PHENYLEPHRINE HCL 1 MG/10ML IV SOSY
1 | Freq: Once | INTRAVENOUS | Status: DC | PRN
Start: 2022-12-04 — End: 2022-12-04

## 2022-12-04 MED ORDER — FENTANYL CITRATE (PF) 100 MCG/2ML IJ SOLN
100 | INTRAMUSCULAR | Status: AC
Start: 2022-12-04 — End: 2022-12-04

## 2022-12-04 MED ORDER — SODIUM CHLORIDE 0.9 % IV SOLN
0.9 | INTRAVENOUS | Status: DC | PRN
Start: 2022-12-04 — End: 2022-12-07

## 2022-12-04 MED ORDER — PHENYLEPHRINE HCL 1 MG/10ML IV SOSY
1 | INTRAVENOUS | Status: AC
Start: 2022-12-04 — End: ?

## 2022-12-04 MED ORDER — OXYCODONE HCL 5 MG PO TABS
5 | ORAL | Status: DC | PRN
Start: 2022-12-04 — End: 2022-12-07
  Administered 2022-12-05 – 2022-12-07 (×7): 10 mg via ORAL

## 2022-12-04 MED ORDER — MORPHINE SULFATE (PF) 1 MG/ML IJ SOLN
1 | INTRAMUSCULAR | Status: AC
Start: 2022-12-04 — End: 2022-12-04

## 2022-12-04 MED ORDER — MISOPROSTOL 200 MCG PO TABS
200 | ORAL | Status: DC | PRN
Start: 2022-12-04 — End: 2022-12-07

## 2022-12-04 MED ORDER — FAMOTIDINE 20 MG PO TABS
20 | Freq: Two times a day (BID) | ORAL | Status: DC
Start: 2022-12-04 — End: 2022-12-07
  Administered 2022-12-05 – 2022-12-06 (×4): 20 mg via ORAL

## 2022-12-04 MED ORDER — SOD CITRATE-CITRIC ACID 500-334 MG/5ML PO SOLN
500-334 | Freq: Once | ORAL | Status: AC
Start: 2022-12-04 — End: 2022-12-04

## 2022-12-04 MED ORDER — VITAFOL-OB PO TABS
Freq: Every day | ORAL | Status: DC
Start: 2022-12-04 — End: 2022-12-04

## 2022-12-04 MED ORDER — VITAFOL-OB PO TABS
Freq: Every day | ORAL | Status: DC
Start: 2022-12-04 — End: 2022-12-07
  Administered 2022-12-05 – 2022-12-06 (×2): 1 via ORAL

## 2022-12-04 MED ORDER — SENNA-DOCUSATE SODIUM 8.6-50 MG PO TABS
8.6-50 | Freq: Every day | ORAL | Status: DC
Start: 2022-12-04 — End: 2022-12-04

## 2022-12-04 MED ORDER — SODIUM CHLORIDE 0.9 % IV SOLN (MINI-BAG)
0.9 | Freq: Once | INTRAVENOUS | Status: AC
Start: 2022-12-04 — End: 2022-12-04

## 2022-12-04 MED ORDER — RHO D IMMUNE GLOBULIN 1500 UNITS IM (WRAPPER)
1500 | Freq: Once | Status: DC
Start: 2022-12-04 — End: 2022-12-04

## 2022-12-04 MED ORDER — OXYCODONE HCL 5 MG PO TABS
5 | ORAL | Status: DC | PRN
Start: 2022-12-04 — End: 2022-12-07
  Administered 2022-12-07: 02:00:00 5 mg via ORAL

## 2022-12-04 MED ORDER — EPHEDRINE SULFATE (PRESSORS) 50 MG/ML IV SOLN
50 | Freq: Once | INTRAVENOUS | Status: DC | PRN
Start: 2022-12-04 — End: 2022-12-04

## 2022-12-04 MED ORDER — DOCUSATE SODIUM 100 MG PO CAPS
100 | Freq: Two times a day (BID) | ORAL | Status: DC
Start: 2022-12-04 — End: 2022-12-07
  Administered 2022-12-05 – 2022-12-07 (×6): 100 mg via ORAL

## 2022-12-04 MED ORDER — TETANUS-DIPHTH-ACELL PERTUSSIS 5-2.5-18.5 LF-MCG/0.5 IM SUSY
INTRAMUSCULAR | Status: DC
Start: 2022-12-04 — End: 2022-12-07

## 2022-12-04 MED ADMIN — phenylephrine (NEO-SYNEPHRINE) 1 MG/10ML prefilled syringe (Push Dose): 100 | INTRAVENOUS | @ 17:00:00 | NDC 71266901001

## 2022-12-04 MED ADMIN — ePHEDrine injection: 5 | INTRAVENOUS | @ 17:00:00 | NDC 17478095510

## 2022-12-04 MED ADMIN — BUPivacaine 0.75% in dextrose 8.25% (intrathecal) (SENSORCAINE) 0.75-8.25 % injection: 12 | INTRATHECAL | @ 16:00:00 | NDC 00409176110

## 2022-12-04 MED ADMIN — phenylephrine (NEO-SYNEPHRINE) 1 MG/10ML prefilled syringe (Push Dose): 200 | INTRAVENOUS | @ 17:00:00 | NDC 71266901001

## 2022-12-04 MED ADMIN — ePHEDrine injection: 10 | INTRAVENOUS | @ 17:00:00 | NDC 17478095510

## 2022-12-04 MED ADMIN — citric acid-sodium citrate (BICITRA) solution 30 mL: 30 mL | ORAL | @ 16:00:00 | NDC 00121119030

## 2022-12-04 MED ADMIN — ondansetron (ZOFRAN) injection: 8 | INTRAVENOUS | @ 16:00:00 | NDC 00641607825

## 2022-12-04 MED ADMIN — fentaNYL (SUBLIMAZE) injection: 10 | INTRAVENOUS | @ 16:00:00 | NDC 00409909422

## 2022-12-04 MED ADMIN — oxytocin (PITOCIN) 30 units in 500 mL infusion: 999 | INTRAVENOUS | @ 17:00:00 | NDC 09999990087

## 2022-12-04 MED ADMIN — acetaminophen (TYLENOL) tablet 975 mg: 975 mg | ORAL | @ 16:00:00 | NDC 00904677361

## 2022-12-04 MED ADMIN — famotidine (PEPCID) 20 mg in sodium chloride (PF) 0.9 % 10 mL injection: 20 mg | INTRAVENOUS | @ 16:00:00 | NDC 63323073911

## 2022-12-04 MED ADMIN — ceFAZolin (ANCEF) 2,000 mg in sodium chloride 0.9 % 100 mL IVPB (mini-bag): 2000 mg | INTRAVENOUS | @ 16:00:00 | NDC 60505623100

## 2022-12-04 MED ADMIN — morphine (PF) injection: .2 | INTRATHECAL | @ 16:00:00 | NDC 00409381512

## 2022-12-04 MED ADMIN — ketorolac (TORADOL) injection: 30 | INTRAMUSCULAR | @ 17:00:00 | NDC 63323016201

## 2022-12-04 MED ADMIN — lactated ringers bolus 1,000 mL: 1000 mL | INTRAVENOUS | @ 15:00:00 | NDC 00338011704

## 2022-12-04 MED ADMIN — oxytocin (PITOCIN) 30 units in 500 mL infusion: 250 | INTRAVENOUS | @ 18:00:00 | NDC 09999990087

## 2022-12-04 MED ADMIN — lactated ringers IV soln infusion: INTRAVENOUS | @ 18:00:00 | NDC 00338011704

## 2022-12-04 MED ADMIN — lactated ringers IV soln infusion: INTRAVENOUS | @ 16:00:00 | NDC 00338011703

## 2022-12-04 MED FILL — OXYTOCIN-SODIUM CHLORIDE 30-0.9 UT/500ML-% IV SOLN: INTRAVENOUS | Qty: 500

## 2022-12-04 MED FILL — DOCUSATE SODIUM 100 MG PO CAPS: 100 MG | ORAL | Qty: 1

## 2022-12-04 MED FILL — FAMOTIDINE (PF) 20 MG/2ML IV SOLN: 20 MG/2ML | INTRAVENOUS | Qty: 2

## 2022-12-04 MED FILL — CEFAZOLIN SODIUM 2 G IJ SOLR: 2 g | INTRAMUSCULAR | Qty: 2000

## 2022-12-04 MED FILL — FENTANYL CITRATE (PF) 100 MCG/2ML IJ SOLN: 100 MCG/2ML | INTRAMUSCULAR | Qty: 2

## 2022-12-04 MED FILL — KETOROLAC TROMETHAMINE 30 MG/ML IJ SOLN: 30 MG/ML | INTRAMUSCULAR | Qty: 1

## 2022-12-04 MED FILL — ONDANSETRON HCL 4 MG/2ML IJ SOLN: 4 MG/2ML | INTRAMUSCULAR | Qty: 4

## 2022-12-04 MED FILL — FAMOTIDINE 20 MG PO TABS: 20 MG | ORAL | Qty: 1

## 2022-12-04 MED FILL — ACETAMINOPHEN 325 MG PO TABS: 325 MG | ORAL | Qty: 3

## 2022-12-04 MED FILL — SOD CITRATE-CITRIC ACID 500-334 MG/5ML PO SOLN: 500-334 MG/5ML | ORAL | Qty: 30

## 2022-12-04 MED FILL — BUPIVACAINE SPINAL 0.75-8.25 % IT SOLN: INTRATHECAL | Qty: 2

## 2022-12-04 MED FILL — OXYTOCIN 30 UNITS IN 500 ML INFUSION: 30 UNIT/500ML | INTRAVENOUS | Qty: 500

## 2022-12-04 MED FILL — MORPHINE SULFATE (PF) 1 MG/ML IJ SOLN: 1 MG/ML | INTRAMUSCULAR | Qty: 10

## 2022-12-04 MED FILL — PHENYLEPHRINE HCL (PRESSORS) 1 MG/10ML IV SOSY: 1 MG/0ML | INTRAVENOUS | Qty: 10

## 2022-12-04 NOTE — Anesthesia Procedure Notes (Signed)
 Spinal Block    Patient location during procedure: OR  End time: 12/04/2022 12:30 PM  Reason for block: primary anesthetic  Staffing  Performed: resident/CRNA   Anesthesiologist: Onita Faden, MD  Resident/CRNA: Jeneal Lunger, APRN - CRNA  Performed by: Jeneal Lunger, APRN - CRNA  Authorized by: Jeneal Lunger, APRN - CRNA    Spinal Block  Patient position: sitting  Prep: ChloraPrep  Patient monitoring: continuous pulse ox, frequent blood pressure checks and oxygen  Approach: midline  Location: L4/L5  Provider prep: mask and sterile gloves  Local infiltration: lidocaine   Needle  Needle type: Pencan   Needle gauge: 25 G  Assessment  Sensory level: T4  Swirl obtained: Yes  CSF: clear  Attempts: 1  Preanesthetic Checklist  Completed: patient identified, IV checked, site marked, risks and benefits discussed, surgical/procedural consents, equipment checked, pre-op evaluation, timeout performed, anesthesia consent given, oxygen available, monitors applied/VS acknowledged, fire risk safety assessment completed and verbalized and blood product R/B/A discussed and consented

## 2022-12-04 NOTE — Op Note (Signed)
[facility]                            OPERATIVE REPORT      PATIENT NAME: [name] [name], [name] [name]                DOB: [birth year]  MED REC NO: [id number]                       ROOM: 2210  ACCOUNT NO: [id number]                       ADMIT DATE: 12/04/2022  PROVIDER: Brien Mates, MD    DATE OF SERVICE:  12/04/2022    PREOPERATIVE DIAGNOSIS:       1. Intrauterine pregnancy at 39 weeks.     2. Previous cesarean section x1.    POSTOPERATIVE DIAGNOSIS:       1. Intrauterine pregnancy at 39 weeks.     2. Previous cesarean section x1.     3. Uterine window.    INDICATIONS:  The patient is a 37 year old, gravida 2, para 1, at 82 weeks for elective repeat C-section due to previous C-section x1.  Initially, with IUGR, which resolved.  Also, there was suspicion of a uterine window during her pregnancy, which was confirmed during her C-section.    SURGEON:  Brien Mates, MD    PROCEDURE:  The patient was taken to the operating room where the patient's spinal was found to be adequate.  The patient was prepped and draped in normal sterile fashion.  A Pfannenstiel skin incision was made with a scalpel.  Incision was carried down to the layer of the fascia with the Bovie.  The fascia was incised in midline with the Bovie on-cut and extended laterally with the Bovie on-cut.  Attention was then turned to the anterior aspect of the fascial incision, which was grasped with Kocher clamps, tented up, underlying layer of rectus muscle was dissected out bluntly as well as the Mayo scissors.  Attention was then turned to the inferior aspect of fascial incision, which was grasped with Kocher clamps, tented up, underlying layer of rectus muscle was dissected out bluntly as well as with Mayo scissors.  Rectus muscles were separated in the midline.  Peritoneum was entered bluntly and stretched with good visualization of the pelvis.  Bladder blade was inserted.  Uterine window was  identified, described above.  Uterine incision was made right above this with a scalpel.  The infant was delivered atraumatically.  Nose and mouth suctioned with suction bulb.  Cord was clamped and cut.  Infant was handed off to awaiting pediatricians.  Cord blood was obtained.  Placenta was delivered manually.  Uterus was exteriorized, cleared off all clots and debris.  Uterine incision was closed with 0 Monocryl in a running fashion.  Second layer of Monocryl was done to create an imbricating layer.  Excellent hemostasis was noted.  Posterior cul-de-sac was cleared off all clots and debris and copiously irrigated.  Uterus was returned to the pelvis.  Gutters were cleared off all clots and debris.  Fascial incision was closed with a 0 Vicryl starting from the right aspect of the fascial incision all the way to the left in a running fashion.  Excellent closure was noted.  Subcutaneous layer was reapproximated with 2-0 Vicryl.  The skin was closed with 3-0 Monocryl on a Mellody Dance  needle.  After I left the OR, I was called back due to the left aspect of the incision that had become loose.  I returned to the OR to reapproximate the skin incision with a 3-0 Vicryl on an SH and met the previous Monocryl incision at the midline.  Excellent closure was noted.  The patient tolerated the procedure well.  Sponge, lap, and needle counts were correct at the end of procedure.  The patient was taken to the recovery room in stable condition.    ASSISTANT:  Dr. Shella Spearing, present for all aspects of the procedure.    PROCEDURE:  Elective repeat C-section via Pfannenstiel.    ESTIMATED BLOOD LOSS:  300 mL.    SPECIMENS:  None.    URINE OUTPUT:  Clear urine at the end of procedure.    ANESTHESIA:  Spinal.        Brien Mates, MD      OFA/AQS  D:  12/05/2022 08:09:14  T:  12/05/2022 09:43:29  JOB #:  [id number]

## 2022-12-04 NOTE — H&P (Signed)
 HISTORY AND PHYSICAL             Date: 12/04/2022        Patient Name: [name] [name]     Date of Birth: Jun 17, 1985      Age:  37 y.o.    Chief Complaint   No chief complaint on file.  IUP 39wks  Previous c/s x1    History Obtained From   patient    History of Present Illness   37 y/o G2P1 @ 39 wks for ERCD. Previous c-section x .PReviously with IUGR but has resolved    Past Medical History     Past Medical History:   Diagnosis Date    Anxiety         Past Surgical History   No past surgical history on file.     Medications Prior to Admission     Prior to Admission medications    Medication Sig Start Date End Date Taking? Authorizing Provider   promethazine  (PHENERGAN ) 25 MG tablet Take 1 tablet by mouth 4 times daily as needed for Nausea 11/21/22   Wetzel Mitchell FALCON, MD   benzocaine -menthol  (CEPACOL SORE THROAT) 15-3.6 MG lozenge Take 1 lozenge by mouth every 2 hours as needed for Sore Throat 11/18/22   Schwartzberg, Powell RAMAN, MD   FLUoxetine  (PROZAC ) 10 MG capsule Take 3 capsules by mouth daily Please give 20mg  tablet and 10 mg tablet for total of 30mg  10/31/22   Schwartzberg, Powell RAMAN, MD   clonazePAM  (KLONOPIN ) 1 MG tablet Take 1 tablet by mouth in the morning and at bedtime for 30 days. Max Daily Amount: 2 mg 10/31/22 11/30/22  Schwartzberg, Powell RAMAN, MD   aluminum  & magnesium  hydroxide-simethicone  (MAALOX) 200-200-20 MG/5ML SUSP suspension Take 30 mLs by mouth every 6 hours as needed for Indigestion 10/13/22   Karyl Velia SAUNDERS, MD   ondansetron  (ZOFRAN -ODT) 4 MG disintegrating tablet Place 1 tablet under the tongue every 8 hours as needed for Nausea or Vomiting 10/13/22   Karyl Velia SAUNDERS, MD   famotidine  (PEPCID ) 40 MG tablet Take 1 tablet by mouth every evening 10/13/22   Karyl Velia SAUNDERS, MD   Prenatal Vit-Fe Fumarate-FA (PRENATAL VITAMINS) 28-0.8 MG TABS Take 1 tablet by mouth daily 04/18/22   Taft Josette Maier, PA-C        Allergies   Doxycycline, Doxycycline calcium, and  Levofloxacin    Social History     Social History       Tobacco History       Smoking Status  Former Smoking Start Date  12/03/2015 Quit Date  12/2020 Average Packs/Day  0.5 packs/day for 5.0 years (2.5 ttl pk-yrs) Smoking Tobacco Type  Cigarettes from 12/03/2015 to 12/2020   Pack Year History     Packs/Day From To Years    0 12/2020  2.0    0.5 12/03/2015 12/2020 5.0      Smokeless Tobacco Use  Never              Alcohol History       Alcohol Use Status  Not Currently Drinks/Week  0 Glasses of wine, 0 Cans of beer, 0 Shots of liquor, 0 Drinks containing 0.5 oz of alcohol per week              Drug Use       Drug Use Status  Never              Sexual Activity       Sexually  Active  Yes Partners  Female                    Family History     Family History   Problem Relation Age of Onset    Unknown Father     Breast Cancer Mother        Review of Systems   Review of Systems   Constitutional: Negative.    Respiratory: Negative.     Cardiovascular: Negative.    Gastrointestinal: Negative.    Genitourinary: Negative.    Musculoskeletal: Negative.    All other systems reviewed and are negative.      Physical Exam   LMP 03/11/2022     Physical Exam  Cardiovascular:      Rate and Rhythm: Normal rate.   Pulmonary:      Effort: Pulmonary effort is normal.   Abdominal:      General: Abdomen is flat.   Neurological:      Mental Status: She is alert.         Labs    No results found for this or any previous visit (from the past 24 hour(s)).     Imaging/Diagnostics Last 24 Hours   No results found.    Assessment    IUP 39wks  Previous c-section    Plan     RH neg: s/p rogham @ 28 wks  Previous c/s x1  Anxiety: on klonopin  refilled  Depression: prozac   IUGR: EFW now 21%tile. BPP wnl  Uterine window: delivery @ 39wks. No more plan for TOLAC. Continue to monitor possible uterine   S/p rhinovirus: hospitalization. Doing better        Consultations Ordered:  None    Electronically signed by Mitchell JULIANNA Anger, MD on 12/04/22 at 9:12 AM  EDT

## 2022-12-04 NOTE — Anesthesia Pre Procedure (Signed)
 Department of Anesthesiology  Preprocedure Note       Name:  [name] [name]   Age:  37 y.o.  DOB:  Apr 15, 1985                                          MRN:  [id number]         Date:  12/04/2022      Surgeon: Clotilde):  Wetzel Mitchell FALCON, MD    Procedure: Procedure(s):  CESAREAN SECTION    Medications prior to admission:   Prior to Admission medications    Medication Sig Start Date End Date Taking? Authorizing Provider   sucralfate  (CARAFATE ) 1 GM tablet Take 1 tablet by mouth in the morning and 1 tablet in the evening.   Yes [provider]   FLUoxetine  (PROZAC ) 10 MG capsule Take 3 capsules by mouth daily Please give 20mg  tablet and 10 mg tablet for total of 30mg  10/31/22  Yes Schwartzberg, Powell RAMAN, MD   famotidine  (PEPCID ) 40 MG tablet Take 1 tablet by mouth every evening 10/13/22  Yes Wannamaker, Velia SAUNDERS, MD   Prenatal Vit-Fe Fumarate-FA (PRENATAL VITAMINS) 28-0.8 MG TABS Take 1 tablet by mouth daily 04/18/22  Yes Taft Josette Maier, PA-C   promethazine  (PHENERGAN ) 25 MG tablet Take 1 tablet by mouth 4 times daily as needed for Nausea  Patient not taking: Reported on 12/04/2022 11/21/22   Wetzel Mitchell FALCON, MD   benzocaine -menthol  (CEPACOL SORE THROAT) 15-3.6 MG lozenge Take 1 lozenge by mouth every 2 hours as needed for Sore Throat  Patient not taking: Reported on 12/04/2022 11/18/22   Schwartzberg, Powell RAMAN, MD   clonazePAM  (KLONOPIN ) 1 MG tablet Take 1 tablet by mouth in the morning and at bedtime for 30 days. Max Daily Amount: 2 mg 10/31/22 11/30/22  Schwartzberg, Powell RAMAN, MD   aluminum  & magnesium  hydroxide-simethicone  (MAALOX) 200-200-20 MG/5ML SUSP suspension Take 30 mLs by mouth every 6 hours as needed for Indigestion  Patient not taking: Reported on 12/04/2022 10/13/22   Karyl Velia SAUNDERS, MD   ondansetron  (ZOFRAN -ODT) 4 MG disintegrating tablet Place 1 tablet under the tongue every 8 hours as needed for Nausea or Vomiting  Patient not taking: Reported on 12/04/2022 10/13/22   Karyl Velia SAUNDERS, MD       Current medications:    Current Facility-Administered Medications   Medication Dose Route Frequency Provider Last Rate Last Admin   . lactated ringers  IV soln infusion   IntraVENous Continuous Akinbote, Oluwamuye F, MD       . lactated ringers  bolus 1,000 mL  1,000 mL IntraVENous Once Akinbote, Oluwamuye F, MD       . sodium chloride  flush 0.9 % injection 10 mL  10 mL IntraVENous 2 times per day Wetzel Mitchell FALCON, MD       . sodium chloride  flush 0.9 % injection 10 mL  10 mL IntraVENous PRN Akinbote, Oluwamuye F, MD       . 0.9 % sodium chloride  infusion   IntraVENous PRN Akinbote, Oluwamuye F, MD       . ceFAZolin  (ANCEF ) 2,000 mg in sodium chloride  0.9 % 100 mL IVPB (mini-bag)  2,000 mg IntraVENous Once Akinbote, Oluwamuye F, MD       . famotidine  (PEPCID ) 20 mg in sodium chloride  (PF) 0.9 % 10 mL injection  20 mg IntraVENous Once Akinbote, Oluwamuye F, MD       .  acetaminophen  (TYLENOL ) tablet 975 mg  975 mg Oral Once Akinbote, Oluwamuye F, MD       . citric acid -sodium citrate  (BICITRA) solution 30 mL  30 mL Oral Once Elinda Bunten, MD       . sodium chloride  flush 0.9 % injection 5-40 mL  5-40 mL IntraVENous 2 times per day Onita Faden, MD       . sodium chloride  flush 0.9 % injection 5-40 mL  5-40 mL IntraVENous PRN Onita Faden, MD       . 0.9 % sodium chloride  infusion   IntraVENous PRN Onita Faden, MD           Allergies:    Allergies   Allergen Reactions   . Doxycycline Anaphylaxis   . Doxycycline Calcium Anaphylaxis   . Levofloxacin Anaphylaxis and Other (See Comments)       Problem List:    Patient Active Problem List   Diagnosis Code   . Abnormal uterine bleeding N93.9   . Acute low back pain with left-sided sciatica M54.42   . Anxiety F41.9   . Biliary dyskinesia K82.8   . Crohn's disease of large bowel (HCC) K50.10   . Generalized abdominal pain R10.84   . Irregular menstruation N92.6   . Left sided sciatica M54.32   . Menometrorrhagia N92.1   . 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 Nausea R11.0   . Panic attack F41.0   . Pelvic pain affecting pregnancy in first trimester, antepartum O26.891, R10.2   . Polycystic ovary syndrome E28.2   . 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 Tooth infection K04.7   . Abdominal pain affecting pregnancy O26.899, R10.9   . Respiratory infection J98.8   . Rhinovirus infection B34.8   . Previous cesarean delivery affecting pregnancy O34.219       Past Medical History:        Diagnosis Date   . Anxiety    . Depression        Past Surgical History:        Procedure Laterality Date   . CESAREAN SECTION  08/22/2014   . CHOLECYSTECTOMY  2021   . TONSILLECTOMY  2009   . WISDOM TOOTH EXTRACTION  03/04/2010       Social History:    Social History     Tobacco Use   . Smoking status: Former     Current packs/day: 0.00     Average packs/day: 0.5 packs/day for 5.0 years (2.5 ttl pk-yrs)     Types: Cigarettes     Start date: 12/03/2015     Quit date: 12/2020     Years since quitting: 2.0   . Smokeless tobacco: Never   Substance Use Topics   . Alcohol use: Not Currently                                Counseling given: Not Answered      Vital Signs (Current):   Vitals:    12/04/22 1027 12/04/22 1117   BP:  107/65   Pulse:  81   Resp:  17   Temp: 97.6 F (36.4 C) 97.6 F (36.4 C)   TempSrc: Oral Oral   SpO2:  97%   Weight:  90.7 kg (200 lb)   Height:  1.6 m (5' 3)  BP Readings from Last 3 Encounters:   12/04/22 107/65   11/28/22 106/72   11/21/22 104/69       NPO Status:                                                                                 BMI:   Wt Readings from Last 3 Encounters:   12/04/22 90.7 kg (200 lb)   11/28/22 88.6 kg (195 lb 6.4 oz)   11/21/22 90 kg (198 lb 6.4 oz)     Body mass index is 35.43 kg/m.    CBC:   Lab Results   Component Value Date/Time    WBC 9.5 12/04/2022 10:42 AM    RBC 3.46 12/04/2022 10:42 AM    HGB 11.4 12/04/2022 10:42 AM    HCT 33.5 12/04/2022 10:42 AM    MCV 96.8  12/04/2022 10:42 AM    RDW 12.4 12/04/2022 10:42 AM    PLT 315 12/04/2022 10:42 AM       CMP:   Lab Results   Component Value Date/Time    NA 140 11/15/2022 11:53 AM    K 4.5 11/15/2022 11:53 AM    CL 107 11/15/2022 11:53 AM    CO2 22 11/15/2022 11:53 AM    BUN 5 11/15/2022 11:53 AM    CREATININE 0.6 11/15/2022 11:53 AM    GFRAA 133 03/19/2020 06:18 AM    LABGLOM 120 11/15/2022 11:53 AM    LABGLOM 121 04/21/2022 03:45 PM    GLUCOSE 83 11/15/2022 11:53 AM    CALCIUM 8.8 11/15/2022 11:53 AM    BILITOT <0.15 11/15/2022 11:53 AM    ALKPHOS 131 11/15/2022 11:53 AM    AST 27 11/15/2022 11:53 AM    ALT 25 11/15/2022 11:53 AM       POC Tests: No results for input(s): POCGLU, POCNA, POCK, POCCL, POCBUN, POCHEMO, POCHCT in the last 72 hours.    Coags:   Lab Results   Component Value Date/Time    PROTIME 13.3 10/13/2022 01:48 PM    INR 1.0 10/13/2022 01:48 PM    APTT 26.4 10/13/2022 01:48 PM       HCG (If Applicable):   Lab Results   Component Value Date    PREGTESTUR Negative 03/19/2020    HCGQUANT 15,951.0 (H) 04/18/2022        ABGs: No results found for: PHART, PO2ART, PCO2ART, HCO3ART, BEART, O2SATART     Type & Screen (If Applicable):  Lab Results   Component Value Date    ABORH Automated 12/04/2022    LABANTI Positive 10/03/2022       Drug/Infectious Status (If Applicable):  Lab Results   Component Value Date/Time    HEPCAB Non Reactive 10/03/2022 04:48 PM       COVID-19 Screening (If Applicable):   Lab Results   Component Value Date/Time    COVID19 Not Detected 11/15/2022 12:51 PM    COVID19 Not Detected 11/15/2022 11:45 AM           Anesthesia Evaluation  Patient summary reviewed and Nursing notes reviewed  Airway: Mallampati: II  TM distance: >3 FB   Neck ROM: full  Mouth opening: > = 3 FB  Dental: normal exam         Pulmonary:                              Cardiovascular:Negative CV ROS                      Neuro/Psych:   (+) depression/anxiety             GI/Hepatic/Renal:            ROS  comment: Crohns disease.   Endo/Other: Negative Endo/Other ROS                    Abdominal:             Vascular: negative vascular ROS.         Other Findings:       Anesthesia Plan      spinal     ASA 2             Anesthetic plan and risks discussed with patient.      Plan discussed with CRNA.                Curtistine Jungling, MD   12/04/2022

## 2022-12-04 NOTE — Anesthesia Postprocedure Evaluation (Signed)
 Department of Anesthesiology  Postprocedure Note    Patient: [name] [name]  MRN: [id number]  Birthdate: 02-11-1986  Date of evaluation: 12/04/2022    Procedure Summary       Date: 12/04/22 Room / Location: [facility] LD OR 01 / [facility] L&D OR    Anesthesia Start: 1226 Anesthesia Stop: 1338    Procedure: CESAREAN SECTION (Abdomen) Diagnosis:       Previous cesarean section      (Previous cesarean section [Z98.891])    Surgeons: Wetzel Mitchell FALCON, MD Responsible Provider: Onita Faden, MD    Anesthesia Type: Spinal ASA Status: 2            Anesthesia Type: Spinal    Aldrete Phase I: Aldrete Score: 10    Aldrete Phase II: Aldrete Score: 10    Anesthesia Post Evaluation    Patient location during evaluation: floor  Patient participation: complete - patient participated  Level of consciousness: awake and alert  Airway patency: patent  Nausea & Vomiting: no nausea and no vomiting  Cardiovascular status: blood pressure returned to baseline  Respiratory status: acceptable  Hydration status: euvolemic  Comments: Vitals reviewed  5/5 LE strength B/L  Pain management: adequate    No notable events documented.

## 2022-12-05 LAB — CBC
Hematocrit: 30.6 % — ABNORMAL LOW (ref 34.0–47.0)
Hemoglobin: 10 g/dL — ABNORMAL LOW (ref 11.5–15.7)
MCH: 32.4 pg (ref 27.0–34.5)
MCHC: 32.7 g/dL (ref 30.0–36.0)
MCV: 99 fL (ref 81.0–99.0)
MPV: 9.1 fL (ref 7.0–12.2)
Platelets: 269 10*3/uL (ref 140–440)
RBC: 3.09 x10e6/mcL — ABNORMAL LOW (ref 3.60–5.20)
RDW: 12.3 % (ref 10.0–17.0)
WBC: 11.5 10*3/uL — ABNORMAL HIGH (ref 3.8–10.6)

## 2022-12-05 LAB — FETAL SCREEN: Fetal Screen Interpretation: NEGATIVE

## 2022-12-05 LAB — RPR: RPR: NONREACTIVE

## 2022-12-05 MED ORDER — CLONAZEPAM 1 MG PO TABS
1 | Freq: Two times a day (BID) | ORAL | Status: DC
Start: 2022-12-05 — End: 2022-12-07
  Administered 2022-12-05 – 2022-12-07 (×5): 1 mg via ORAL

## 2022-12-05 MED ORDER — FLUOXETINE HCL 10 MG PO CAPS
10 | Freq: Every day | ORAL | Status: DC
Start: 2022-12-05 — End: 2022-12-07
  Administered 2022-12-05 – 2022-12-07 (×3): 30 mg via ORAL

## 2022-12-05 MED ADMIN — ketorolac (TORADOL) injection 30 mg: 30 mg | INTRAVENOUS | @ 06:00:00 | NDC 63323016200

## 2022-12-05 MED ADMIN — ketorolac (TORADOL) injection 30 mg: 30 mg | INTRAVENOUS | @ 12:00:00 | NDC 63323016200

## 2022-12-05 MED ADMIN — ketorolac (TORADOL) injection 30 mg: 30 mg | INTRAVENOUS | NDC 63323016200

## 2022-12-05 MED FILL — OXYCODONE HCL 5 MG PO TABS: 5 MG | ORAL | Qty: 2

## 2022-12-05 MED FILL — ACETAMINOPHEN EXTRA STRENGTH 500 MG PO TABS: 500 MG | ORAL | Qty: 2

## 2022-12-05 MED FILL — IBUPROFEN 800 MG PO TABS: 800 MG | ORAL | Qty: 1

## 2022-12-05 MED FILL — DOCUSATE SODIUM 100 MG PO CAPS: 100 MG | ORAL | Qty: 1

## 2022-12-05 MED FILL — VITAFOL-OB PO TABS: ORAL | Qty: 1

## 2022-12-05 MED FILL — FLUOXETINE HCL 10 MG PO CAPS: 10 MG | ORAL | Qty: 3

## 2022-12-05 MED FILL — FAMOTIDINE 20 MG PO TABS: 20 MG | ORAL | Qty: 1

## 2022-12-05 MED FILL — CLONAZEPAM 1 MG PO TABS: 1 MG | ORAL | Qty: 1

## 2022-12-05 MED FILL — KETOROLAC TROMETHAMINE 30 MG/ML IJ SOLN: 30 MG/ML | INTRAMUSCULAR | Qty: 1

## 2022-12-05 MED FILL — NORMAL SALINE FLUSH 0.9 % IV SOLN: 0.9 % | INTRAVENOUS | Qty: 10

## 2022-12-05 NOTE — Plan of Care (Signed)
 Problem: Pain  Goal: Verbalizes/displays adequate comfort level or baseline comfort level  12/05/2022 0849 by Gladis Shaver, RN  Outcome: Progressing  12/05/2022 0245 by Vela Edelman, RN  Outcome: Progressing  Flowsheets (Taken 12/04/2022 2004)  Verbalizes/displays adequate comfort level or baseline comfort level:   Encourage patient to monitor pain and request assistance   Assess pain using appropriate pain scale   Administer analgesics based on type and severity of pain and evaluate response   Implement non-pharmacological measures as appropriate and evaluate response   Consider cultural and social influences on pain and pain management   Notify Licensed Independent Practitioner if interventions unsuccessful or patient reports new pain     Problem: Safety - Adult  Goal: Free from fall injury  12/05/2022 0849 by Gladis Shaver, RN  Outcome: Progressing  12/05/2022 0245 by Vela Edelman, RN  Outcome: Progressing     Problem: ABCDS Injury Assessment  Goal: Absence of physical injury  12/05/2022 0849 by Gladis Shaver, RN  Outcome: Progressing  12/05/2022 0245 by Vela Edelman, RN  Outcome: Progressing     Problem: Postpartum  Goal: Experiences normal postpartum course  Description:  Postpartum OB-Pregnancy care plan goal which identifies if the mother is experiencing a normal postpartum course  12/05/2022 0849 by Gladis Shaver, RN  Outcome: Progressing  12/05/2022 0245 by Vela Edelman, RN  Outcome: Progressing  Goal: Appropriate maternal - newborn bonding  Description:  Postpartum OB-Pregnancy care plan goal which identifies if the mother and newborn are bonding appropriately  12/05/2022 0849 by Gladis Shaver, RN  Outcome: Progressing  12/05/2022 0245 by Vela Edelman, RN  Outcome: Progressing  Goal: Establishment of infant feeding pattern  Description:  Postpartum OB-Pregnancy care plan goal which identifies if the mother is establishing a feeding pattern with their newborn  12/05/2022 0849 by  Gladis Shaver, RN  Outcome: Progressing  12/05/2022 0245 by Vela Edelman, RN  Outcome: Progressing  Goal: Incisions, wounds, or drain sites healing without S/S of infection  12/05/2022 0849 by Gladis Shaver, RN  Outcome: Progressing  Flowsheets (Taken 12/05/2022 0809)  Incisions, Wounds, or Drain Sites Healing Without Sign and Symptoms of Infection: ADMISSION and DAILY: Assess and document risk factors for pressure ulcer development  12/05/2022 0245 by Vela Edelman, RN  Outcome: Progressing     Problem: Infection - Adult  Goal: Absence of infection at discharge  12/05/2022 0849 by Gladis Shaver, RN  Outcome: Progressing  12/05/2022 0245 by Vela Edelman, RN  Outcome: Progressing  Goal: Absence of infection during hospitalization  12/05/2022 0849 by Gladis Shaver, RN  Outcome: Progressing  12/05/2022 0245 by Vela Edelman, RN  Outcome: Progressing  Flowsheets (Taken 12/04/2022 2004)  Absence of infection during hospitalization:   Assess and monitor for signs and symptoms of infection   Monitor lab/diagnostic results   Monitor all insertion sites i.e., indwelling lines, tubes and drains  Goal: Absence of fever/infection during anticipated neutropenic period  12/05/2022 0849 by Gladis Shaver, RN  Outcome: Progressing  12/05/2022 0245 by Vela Edelman, RN  Outcome: Progressing     Problem: Discharge Planning  Goal: Discharge to home or other facility with appropriate resources  12/05/2022 0849 by Gladis Shaver, RN  Outcome: Progressing  12/05/2022 0245 by Vela Edelman, RN  Outcome: Progressing  Flowsheets (Taken 12/04/2022 2004)  Discharge to home or other facility with appropriate resources: Identify barriers to discharge with patient and caregiver     Problem: Chronic Conditions and Co-morbidities  Goal: Patient's chronic conditions and co-morbidity symptoms are monitored and  maintained or improved  12/05/2022 0849 by Gladis Shaver, RN  Outcome: Progressing  12/05/2022 0245 by Vela Edelman, RN  Outcome: Progressing

## 2022-12-05 NOTE — Telephone Encounter (Signed)
 Pt stated she delivered 12/04/22 and is still in the hospital and requested a refill or Fluoxetine  and clonazepam . Pt stated she think it was some misunderstanding that the hospital would send her home with the refills and they told her they wouldn't do that. It look like [facility] sent them in today but don't think they went through. Pharmacy confirmed [facility].

## 2022-12-05 NOTE — Telephone Encounter (Signed)
 Spoke to patient and advised medications will be refilled when she leaves the hospital pt verbalized understanding and gratitude

## 2022-12-05 NOTE — Progress Notes (Signed)
 Progress Note  Date:12/05/2022       Room:2210/01  Patient Name:[name] [name]     Date of Birth:[birth year]     Age:37 y.o.        Subjective    Subjective:  Symptoms:  Stable.  No shortness of breath or chest pain.    Diet:  Adequate intake.  No nausea or vomiting.    Activity level: Normal.    Pain:  She complains of pain that is mild.  She reports pain is improving.  Pain is partially controlled.       Review of Systems   Constitutional:  Negative for chills and fever.   Respiratory:  Negative for shortness of breath.    Cardiovascular:  Negative for chest pain.   Gastrointestinal:  Positive for abdominal pain. Negative for nausea and vomiting.   Genitourinary:  Negative for dysuria, frequency and urgency.   Neurological:  Negative for dizziness and headaches.   Psychiatric/Behavioral:  Negative for suicidal ideas.      Objective         Vitals Last 24 Hours:  TEMPERATURE:  Temp  Avg: 98 F (36.7 C)  Min: 97.6 F (36.4 C)  Max: 98.3 F (36.8 C)  RESPIRATIONS RANGE: Resp  Avg: 17.1  Min: 16  Max: 18  PULSE OXIMETRY RANGE: SpO2  Avg: 97.9 %  Min: 96 %  Max: 100 %  PULSE RANGE: Pulse  Avg: 79  Min: 66  Max: 92  BLOOD PRESSURE RANGE: Systolic (24hrs), Avg:111 , Min:103 , Max:143   ; Diastolic (24hrs), Avg:66, Min:50, Max:80    I/O (24Hr):    Intake/Output Summary (Last 24 hours) at 12/05/2022 0907  Last data filed at 12/05/2022 0505  Gross per 24 hour   Intake 1100 ml   Output 1359 ml   Net -259 ml     Objective:  General Appearance:  Well-appearing, comfortable and in no acute distress.    Vital signs: (most recent): Blood pressure 114/78, pulse 70, temperature 98.3 F (36.8 C), temperature source Oral, resp. rate 18, height 1.6 m (5' 3), weight 90.7 kg (200 lb), last menstrual period 03/11/2022, SpO2 100%, unknown if currently breastfeeding.  No fever.    Lungs:  Normal effort and normal respiratory rate.  Breath sounds clear to auscultation.  She is not in respiratory distress.  No stridor.  No rales, decreased  breath sounds, wheezes or rhonchi.    Heart: Normal rate.  No murmur, gallop or friction rub.   Abdomen: Abdomen is soft and non-distended.  (Incision dressing drainage within marked lines. Remove today and monitor).  Bowel sounds are normal.     Extremities: Normal range of motion.    Neurological: Patient is alert and oriented to person, place and time.    Skin:  Warm and dry.  No rash.     Labs/Imaging/Diagnostics    Labs:  CBC:  Recent Labs     12/04/22  1042 12/05/22  0602   WBC 9.5 11.5*   RBC 3.46* 3.09*   HGB 11.4* 10.0*   HCT 33.5* 30.6*   MCV 96.8 99.0   RDW 12.4 12.3   PLT 315 269     CHEMISTRIES:No results for input(s): NA, K, CL, CO2, BUN, CREATININE, GLUCOSE, PHOS, MG in the last 72 hours.    Invalid input(s): CA  PT/INR:No results for input(s): PROTIME, INR in the last 72 hours.  APTT:No results for input(s): APTT in the last 72 hours.  LIVER PROFILE:No results for input(s):  AST, ALT, BILIDIR, BILITOT, ALKPHOS in the last 72 hours.    Imaging Last 24 Hours:  No results found.  Assessment//Plan           Hospital Problems             Last Modified POA    * (Principal) Previous cesarean delivery affecting pregnancy 12/04/2022 Yes     Assessment:    Condition: In stable condition.  Improving.   (S/p repeat c seciton. Pt needing prozac  and Klonopin  in hospital. Ambulating and voiding. Passing gas. ).     Plan:   Encourage ambulation.  Regular diet.   (Okay with Dr Wetzel to resume prozac  and Klonopin . Monitor incision. ).       Electronically signed by Gordy CHRISTELLA Saltness, APRN - NP on 12/05/22 at 9:07 AM EDT

## 2022-12-05 NOTE — Plan of Care (Signed)
 Problem: Pain  Goal: Verbalizes/displays adequate comfort level or baseline comfort level  Outcome: Progressing  Flowsheets (Taken 12/04/2022 2004)  Verbalizes/displays adequate comfort level or baseline comfort level:   Encourage patient to monitor pain and request assistance   Assess pain using appropriate pain scale   Administer analgesics based on type and severity of pain and evaluate response   Implement non-pharmacological measures as appropriate and evaluate response   Consider cultural and social influences on pain and pain management   Notify Licensed Independent Practitioner if interventions unsuccessful or patient reports new pain     Problem: Safety - Adult  Goal: Free from fall injury  Outcome: Progressing     Problem: Postpartum  Goal: Experiences normal postpartum course  Description:  Postpartum OB-Pregnancy care plan goal which identifies if the mother is experiencing a normal postpartum course  Outcome: Progressing  Goal: Appropriate maternal - newborn bonding  Description:  Postpartum OB-Pregnancy care plan goal which identifies if the mother and newborn are bonding appropriately  Outcome: Progressing  Goal: Establishment of infant feeding pattern  Description:  Postpartum OB-Pregnancy care plan goal which identifies if the mother is establishing a feeding pattern with their newborn  Outcome: Progressing  Goal: Incisions, wounds, or drain sites healing without S/S of infection  Outcome: Progressing     Problem: Infection - Adult  Goal: Absence of infection at discharge  Outcome: Progressing  Goal: Absence of infection during hospitalization  Outcome: Progressing  Flowsheets (Taken 12/04/2022 2004)  Absence of infection during hospitalization:   Assess and monitor for signs and symptoms of infection   Monitor lab/diagnostic results   Monitor all insertion sites i.e., indwelling lines, tubes and drains  Goal: Absence of fever/infection during anticipated neutropenic period  Outcome: Progressing      Problem: Discharge Planning  Goal: Discharge to home or other facility with appropriate resources  Outcome: Progressing  Flowsheets (Taken 12/04/2022 2004)  Discharge to home or other facility with appropriate resources: Identify barriers to discharge with patient and caregiver     Problem: Chronic Conditions and Co-morbidities  Goal: Patient's chronic conditions and co-morbidity symptoms are monitored and maintained or improved  Outcome: Progressing

## 2022-12-05 NOTE — Care Coordination (Addendum)
 CM contacted pt to discuss UDS results.   CM introduced self, role, and reason for the call.      Reason for consult: Positive UDS     Present in the room: Pt, FOB, NB     Mother gave permission to discuss information in the presences of FOB.       Baby's name: Journey     Father's name: Terrye Piety  Father's phone number:  [phone]    Other children: 35 yo daughter    Who Lives in the home: Pt, dtr, FOB    Were you receiving WIC prior to admission: Yes    Baby has a safe place to sleep: Yes    Baby has a car seat:Yes    Other needs: Denies    Pediatrician:not on record, mom arranging  Do you have Transportation :  Mother reports to RN that she is concerned about using [facility] for appts, did report that she has reliable transportation to CM during last admission 9/16.        Support sytem:Family    Current or Past DSS involvement: No    Any Substance Abuse History: UDS+ for marijuana, does not discuss why this would be a positive result  What did you test positive for: Marijuana    Any history of Mental Health: Yes. It appears that pt anxiety.depression meds are being prescribed by OBGYN at this time.       CM met with pt and FOB at bedside. Pt communicated throughout discussing using hand gestures, body language, facial expressions and head nods, she did not say anything during the assessment. Pt nodded yes that FOB could stay in the room during consult in which medical information would be shared. DSS called for pos UDS result, case was accepted for investigation. Pt and FOB informed, and told to expect a CPS worker to meet with them before dc and discuss process. Father expresses understanding. Pt given resources at last admissions by case manager, CM will provide again if pt needs. CM will continue to follow.

## 2022-12-06 MED ORDER — CLONAZEPAM 1 MG PO TABS
1 MG | ORAL_TABLET | Freq: Two times a day (BID) | ORAL | 0 refills | Status: AC
Start: 2022-12-06 — End: 2023-01-05

## 2022-12-06 MED ORDER — DSS 100 MG PO CAPS
100 | ORAL_CAPSULE | Freq: Two times a day (BID) | ORAL | 1 refills | Status: DC
Start: 2022-12-06 — End: 2023-11-23

## 2022-12-06 MED ORDER — OXYCODONE HCL 5 MG PO TABS
5 | ORAL_TABLET | Freq: Four times a day (QID) | ORAL | 0 refills | Status: AC | PRN
Start: 2022-12-06 — End: 2022-12-09

## 2022-12-06 MED ORDER — NALOXONE HCL 0.4 MG/ML IJ SOLN
0.4 | INTRAMUSCULAR | 1 refills | Status: DC | PRN
Start: 2022-12-06 — End: 2023-01-02

## 2022-12-06 MED ORDER — FLUOXETINE HCL 10 MG PO CAPS
10 MG | ORAL_CAPSULE | Freq: Every day | ORAL | 0 refills | Status: DC
Start: 2022-12-06 — End: 2022-12-30

## 2022-12-06 MED ORDER — IBUPROFEN 800 MG PO TABS
800 | ORAL_TABLET | Freq: Three times a day (TID) | ORAL | 1 refills | 15.00000 days | Status: DC
Start: 2022-12-06 — End: 2023-11-23

## 2022-12-06 MED FILL — CLONAZEPAM 1 MG PO TABS: 1 MG | ORAL | Qty: 1

## 2022-12-06 MED FILL — OXYCODONE HCL 5 MG PO TABS: 5 MG | ORAL | Qty: 2

## 2022-12-06 MED FILL — FLUOXETINE HCL 10 MG PO CAPS: 10 MG | ORAL | Qty: 3

## 2022-12-06 MED FILL — IBUPROFEN 800 MG PO TABS: 800 MG | ORAL | Qty: 1

## 2022-12-06 MED FILL — VITAFOL-OB PO TABS: ORAL | Qty: 1

## 2022-12-06 MED FILL — ACETAMINOPHEN EXTRA STRENGTH 500 MG PO TABS: 500 MG | ORAL | Qty: 2

## 2022-12-06 MED FILL — DOCUSATE SODIUM 100 MG PO CAPS: 100 MG | ORAL | Qty: 1

## 2022-12-06 MED FILL — FAMOTIDINE 20 MG PO TABS: 20 MG | ORAL | Qty: 1

## 2022-12-06 MED FILL — SENNA S 8.6-50 MG PO TABS: ORAL | Qty: 1

## 2022-12-06 NOTE — Progress Notes (Signed)
 Progress Note  Date:12/06/2022       Room:2210/01  Patient Name:[name] [name]     Date of Birth:10-22-1985     Age:37 y.o.        Subjective    Subjective:  Symptoms:  Stable.  No shortness of breath or chest pain.    Diet:  Adequate intake.  No nausea or vomiting.    Activity level: Normal.    Pain:  She complains of pain that is mild.  She reports pain is improving.  Pain is well controlled.       Review of Systems   Constitutional:  Negative for chills and fever.   Respiratory:  Negative for shortness of breath.    Cardiovascular:  Negative for chest pain.   Gastrointestinal:  Positive for abdominal pain. Negative for nausea and vomiting.   Genitourinary:  Negative for dysuria, frequency and urgency.   Neurological:  Negative for dizziness and headaches.   Psychiatric/Behavioral:  Negative for suicidal ideas.      Objective         Vitals Last 24 Hours:  TEMPERATURE:  Temp  Avg: 97.9 F (36.6 C)  Min: 97.5 F (36.4 C)  Max: 98.1 F (36.7 C)  RESPIRATIONS RANGE: Resp  Avg: 17.5  Min: 16  Max: 18  PULSE OXIMETRY RANGE: SpO2  Avg: 97.8 %  Min: 97 %  Max: 100 %  PULSE RANGE: Pulse  Avg: 76  Min: 67  Max: 82  BLOOD PRESSURE RANGE: Systolic (24hrs), Avg:119 , Min:97 , Max:151   ; Diastolic (24hrs), Avg:75, Min:54, Max:82    I/O (24Hr):  No intake or output data in the 24 hours ending 12/06/22 0941  Objective:  General Appearance:  Well-appearing, comfortable and in no acute distress.    Vital signs: (most recent): Blood pressure 120/82, pulse 82, temperature 97.9 F (36.6 C), temperature source Oral, resp. rate 18, height 1.6 m (5' 3), weight 90.7 kg (200 lb), last menstrual period 03/11/2022, SpO2 97%, unknown if currently breastfeeding.  No fever.    Lungs:  Normal effort and normal respiratory rate.  Breath sounds clear to auscultation.  She is not in respiratory distress.  No stridor.  No rales, decreased breath sounds, wheezes or rhonchi.    Heart: Normal rate.  No murmur, gallop or friction rub.   Abdomen:  Abdomen is soft and non-distended.  (Incision CDI. Dressing removed today. No erythema, drainage or edema. ).  Bowel sounds are normal.     Extremities: Normal range of motion.    Neurological: Patient is alert and oriented to person, place and time.    Skin:  Warm and dry.  No rash.     Labs/Imaging/Diagnostics    Labs:  CBC:  Recent Labs     12/04/22  1042 12/05/22  0602   WBC 9.5 11.5*   RBC 3.46* 3.09*   HGB 11.4* 10.0*   HCT 33.5* 30.6*   MCV 96.8 99.0   RDW 12.4 12.3   PLT 315 269     CHEMISTRIES:No results for input(s): NA, K, CL, CO2, BUN, CREATININE, GLUCOSE, PHOS, MG in the last 72 hours.    Invalid input(s): CA  PT/INR:No results for input(s): PROTIME, INR in the last 72 hours.  APTT:No results for input(s): APTT in the last 72 hours.  LIVER PROFILE:No results for input(s): AST, ALT, BILIDIR, BILITOT, ALKPHOS in the last 72 hours.    Imaging Last 24 Hours:  No results found.  Assessment//Plan  Hospital Problems             Last Modified POA    * (Principal) Previous cesarean section 12/06/2022 Yes     Assessment:    Condition: In stable condition.  Improving.       Plan:   Discharge home.         Electronically signed by Gordy CHRISTELLA Saltness, APRN - NP on 12/06/22 at 9:41 AM EDT

## 2022-12-06 NOTE — Plan of Care (Signed)
 Problem: Pain  Goal: Verbalizes/displays adequate comfort level or baseline comfort level  Outcome: Progressing  Flowsheets (Taken 12/05/2022 1934)  Verbalizes/displays adequate comfort level or baseline comfort level:   Encourage patient to monitor pain and request assistance   Assess pain using appropriate pain scale   Administer analgesics based on type and severity of pain and evaluate response   Implement non-pharmacological measures as appropriate and evaluate response   Consider cultural and social influences on pain and pain management   Notify Licensed Independent Practitioner if interventions unsuccessful or patient reports new pain     Problem: Safety - Adult  Goal: Free from fall injury  Outcome: Progressing     Problem: ABCDS Injury Assessment  Goal: Absence of physical injury  Outcome: Progressing     Problem: Postpartum  Goal: Experiences normal postpartum course  Description:  Postpartum OB-Pregnancy care plan goal which identifies if the mother is experiencing a normal postpartum course  Outcome: Progressing  Goal: Appropriate maternal - newborn bonding  Description:  Postpartum OB-Pregnancy care plan goal which identifies if the mother and newborn are bonding appropriately  Outcome: Progressing  Goal: Establishment of infant feeding pattern  Description:  Postpartum OB-Pregnancy care plan goal which identifies if the mother is establishing a feeding pattern with their newborn  Outcome: Progressing  Goal: Incisions, wounds, or drain sites healing without S/S of infection  Outcome: Progressing     Problem: Infection - Adult  Goal: Absence of infection at discharge  Outcome: Progressing  Goal: Absence of infection during hospitalization  Outcome: Progressing  Flowsheets (Taken 12/05/2022 1934)  Absence of infection during hospitalization:   Monitor lab/diagnostic results   Assess and monitor for signs and symptoms of infection  Goal: Absence of fever/infection during anticipated neutropenic  period  Outcome: Progressing     Problem: Discharge Planning  Goal: Discharge to home or other facility with appropriate resources  Outcome: Progressing  Flowsheets (Taken 12/05/2022 1934)  Discharge to home or other facility with appropriate resources: Identify barriers to discharge with patient and caregiver

## 2022-12-06 NOTE — Discharge Summary (Signed)
 Discharge Summary    Date: 12/06/2022  Patient Name: [name] [name]    Date of Birth: 1985-03-17     Age: 37 y.o.    Admit Date: 12/04/2022  Discharge Date: 12/06/2022  Discharge Condition: Good    Admission Diagnosis  Previous cesarean section [S01.108];Previous cesarean delivery affecting pregnancy [O34.219]      Discharge Diagnosis  Principal Problem:    Previous cesarean section  Resolved Problems:    * No resolved hospital problems. [facility] Stay  Narrative of Hospital Course:  S/p repeat c-section on 12/04/22. Ambulating and voiding. Pending peds review of baby for Klonopin . Overall doing well    Consultants:  IP CONSULT TO LACTATION    Surgeries/procedures Performed:      Treatments:            Discharge Plan/Disposition:  Home    Hospital/Incidental Findings Requiring Follow Up:    Patient Instructions:    Diet:    Activity:Activity as Tolerated and Activiity as Tolerated, No Driving for Today  For number of days (if applicable):      Other Instructions: Prozac  and klonopin  was sent per Dr Wetzel. Plan for discharge today. Okay to cancel or room in if baby stays. Follow up 2 weeks for incision check.     Provider Follow-Up:   No follow-ups on file.     Significant Diagnostic Studies:    Recent Labs:  Admission on 12/04/2022  WBC                                           Date: 12/04/2022  Value: 9.5         Ref range: 3.8 - 10.6 x10e3*  Status: Final  RBC                                           Date: 12/04/2022  Value: 3.46 (L)    Ref range: 3.60 - 5.20 x10e*  Status: Final  Hemoglobin                                    Date: 12/04/2022  Value: 11.4 (L)    Ref range: 11.5 - 15.7 g/dL   Status: Final  Hematocrit                                    Date: 12/04/2022  Value: 33.5 (L)    Ref range: 34.0 - 47.0 %      Status: Final  MCV                                           Date: 12/04/2022  Value: 96.8        Ref range: 81.0 - 99.0 fL     Status: Final  MCH  Date:  12/04/2022  Value: 32.9        Ref range: 27.0 - 34.5 pg     Status: Final  MCHC                                          Date: 12/04/2022  Value: 34.0        Ref range: 30.0 - 36.0 g/dL   Status: Final  RDW                                           Date: 12/04/2022  Value: 12.4        Ref range: 10.0 - 17.0 %      Status: Final  Platelets                                     Date: 12/04/2022  Value: 315         Ref range: 140 - 440 x10e3/*  Status: Final  MPV                                           Date: 12/04/2022  Value: 9.2         Ref range: 7.0 - 12.2 fL      Status: Final  RPR                                           Date: 12/04/2022  Value: Non-Reactive                     Ref range: Non-Reactive       Status: Final  Amphetamine Screen, Ur                        Date: 12/04/2022  Value: Negative    Ref range: Negative           Status: Final  Benzodiazepine Screen, Urine                  Date: 12/04/2022  Value: Positive (A) Ref range: Negative           Status: Final  Barbiturate Screen, Ur                        Date: 12/04/2022  Value: Negative    Ref range: Negative           Status: Final  Cannabinoid Scrn, Ur                          Date: 12/04/2022  Value: Positive (A) Ref range: Negative           Status: Final  Opiate Screen, Urine                          Date: 12/04/2022  Value: Negative    Ref range:  Negative           Status: Final  Fentanyl , Ur                                  Date: 12/04/2022  Value: Negative    Ref range: Negative           Status: Final  Cocaine Screen Urine                          Date: 12/04/2022  Value: Negative    Ref range: Negative           Status: Final                Comment: DRUG SCREEN INTERPRETATION:  Effective 12/26/2021: Interpretation Revised    The submitted urine specimen was tested at the following cutoffs:    Drug Class                         Cutoff Level  ----------------------------------------------  Amphetamines                       1000  ng/mL  Barbiturates                        200 ng/mL  Cocaine Metabolites                 300 ng/mL  Marijuana Metabolites                50 ng/mL  Opiates                             300 ng/mL  Benzodiazepine                      100 ng/mL  Fentanyl                               5 ng/mL    * Specimen was received without chain of custody and may not have *  * been handled as a legal specimen. Results should be used for    *  * medical purposes only and not for any legal or employment       *  * evaluative purposes.                                            *  *                                                                 *  * This test provides only a presumptive screening test result.    *  * The Opiate                          drug class detects morphine  and its metabolites,     *  *  heroin, and codeine . It does not detect oxycodone , meperidine   *  * or fentanyl . Elevated urine specific gravity, low urine pH, and *  * other interfering substances can cause a false positive result. *  * If positive results do not correlate with the clinical          *  * situation, a confirmation is strongly advised.                  *  * A more specific method, Gas Chromotography/Mass Spectrometry    *  * (GC/MS) is the preferred confirmatory method. Sample will be    *  * sent to reference laboratory for confirmation upon request      *    ABO/RH Method                                 Date: 12/04/2022  Value: Automated     Status: Final  Antibody Screen, Method                       Date: 12/04/2022  Value: Automated     Status: Final  ABO/Rh                                        Date: 12/04/2022  Value: B NEG         Status: Final  Antibody Screen                               Date: 12/04/2022  Value: Positive      Status: Final  Antibody ID                                   Date: 12/04/2022  Value: Positive      Status: Final                Comment: 12/04/2022 13:04  [id number]  Passively acquired Anti-D due to the  administration of antenatal Rh Immune  Globulin.  All other clinically significant alloantibodies ruled out    WBC                                           Date: 12/05/2022  Value: 11.5 (H)    Ref range: 3.8 - 10.6 x10e3*  Status: Final  RBC                                           Date: 12/05/2022  Value: 3.09 (L)    Ref range: 3.60 - 5.20 x10e*  Status: Final  Hemoglobin                                    Date: 12/05/2022  Value: 10.0 (L)    Ref range: 11.5 - 15.7 g/dL   Status: Final  Hematocrit  Date: 12/05/2022  Value: 30.6 (L)    Ref range: 34.0 - 47.0 %      Status: Final  MCV                                           Date: 12/05/2022  Value: 99.0        Ref range: 81.0 - 99.0 fL     Status: Final  MCH                                           Date: 12/05/2022  Value: 32.4        Ref range: 27.0 - 34.5 pg     Status: Final  MCHC                                          Date: 12/05/2022  Value: 32.7        Ref range: 30.0 - 36.0 g/dL   Status: Final  RDW                                           Date: 12/05/2022  Value: 12.3        Ref range: 10.0 - 17.0 %      Status: Final  Platelets                                     Date: 12/05/2022  Value: 269         Ref range: 140 - 440 x10e3/*  Status: Final  MPV                                           Date: 12/05/2022  Value: 9.1         Ref range: 7.0 - 12.2 fL      Status: Final  Fetal Screen Interpretation                   Date: 12/05/2022  Value: Negative      Status: Final  ------------    Radiology last 7 days:  No results found.     Pending Labs     Order Current Status    ABO/RH In process    ABO/RH In process    ANTIBODY SCREEN In process    TYPE AND SCREEN In process        Discharge Medications    Current Discharge Medication List    START taking these medications    oxyCODONE  (ROXICODONE ) 5 MG immediate release tablet  Take 1 tablet by mouth every 6 hours as needed for Pain for up to 3 days. Max Daily Amount: 20  mg  Qty: 12 tablet Refills: 0  Comments: Reduce doses taken as pain becomes manageable  Associated Diagnoses:Status post repeat low transverse cesarean section    ibuprofen  (ADVIL ;MOTRIN ) 800 MG tablet  Take  1 tablet by mouth in the morning and 1 tablet at noon and 1 tablet in the evening.  Qty: 60 tablet Refills: 1    naloxone  (NARCAN ) 0.4 MG/ML injection  Infuse 0.5 mLs intravenously as needed (Itching, opiod withdrawl)  Qty: 1 mL Refills: 1    docusate sodium  (COLACE, DULCOLAX) 100 MG CAPS  Take 100 mg by mouth 2 times daily  Qty: 60 capsule Refills: 1          Current Discharge Medication List        Current Discharge Medication List    CONTINUE these medications which have NOT CHANGED    sucralfate  (CARAFATE ) 1 GM tablet  Take 1 tablet by mouth in the morning and 1 tablet in the evening.    famotidine  (PEPCID ) 40 MG tablet  Take 1 tablet by mouth every evening  Qty: 30 tablet Refills: 3    Prenatal Vit-Fe Fumarate-FA (PRENATAL VITAMINS) 28-0.8 MG TABS  Take 1 tablet by mouth daily  Qty: 30 tablet Refills: 0    clonazePAM  (KLONOPIN ) 1 MG tablet  Take 1 tablet by mouth in the morning and at bedtime for 30 days. Max Daily Amount: 2 mg  Qty: 60 tablet Refills: 0  Associated Diagnoses:Anxiety    FLUoxetine  (PROZAC ) 10 MG capsule  Take 3 capsules by mouth daily Please give 20mg  tablet and 10 mg tablet for total of 30mg   Qty: 90 capsule Refills: 0  Associated Diagnoses:Anxiety          Current Discharge Medication List    STOP taking these medications    promethazine  (PHENERGAN ) 25 MG tablet  Comments:  Reason for Stopping:    benzocaine -menthol  (CEPACOL SORE THROAT) 15-3.6 MG lozenge  Comments:  Reason for Stopping:    aluminum  & magnesium  hydroxide-simethicone  (MAALOX) 200-200-20 MG/5ML SUSP suspension  Comments:  Reason for Stopping:    ondansetron  (ZOFRAN -ODT) 4 MG disintegrating tablet  Comments:  Reason for Stopping:          Time Spent on Discharge:  30 minutes were spent in patient examination, evaluation,  counseling as well as medication reconciliation, prescriptions for required medications, discharge plan, and follow up.    Electronically signed by Gordy CHRISTELLA Saltness, APRN - NP on 12/06/22 at 9:43 AM EDT

## 2022-12-06 NOTE — Care Coordination (Addendum)
 Addendum LBL: DSS meeting with pt at bedside. They do not anticipating putting a protector or safety plan in place, but will let CM know for sure when the decision is made.     2022/09/22 LBL: Ms Windle [phone]) from [place] DSS will be coming to meet with family this AM to discuss plan.     11-29-22 LBL: CM contacted pt to discuss UDS results.   CM introduced self, role, and reason for the call.       Reason for consult: Positive UDS      Present in the room: Pt, FOB, NB           Mother gave permission to discuss information in the presences of FOB.        Baby's name: Journey      Father's name: Khristin Keleher  Father's phone number:  [phone]     Other children: 15 yo daughter     Who Lives in the home: Pt, dtr, FOB     Were you receiving WIC prior to admission: Yes     Baby has a safe place to sleep: Yes     Baby has a car seat:Yes     Other needs: Denies     Pediatrician:not on record, mom arranging  Do you have Transportation :  Mother reports to RN that she is concerned about using [facility] for appts, did report that she has reliable transportation to CM during last admission 9/16.          Support sytem:Family     Current or Past DSS involvement: No     Any Substance Abuse History: UDS+ for marijuana, does not discuss why this would be a positive result  What did you test positive for: Marijuana     Any history of Mental Health: Yes. It appears that pt anxiety.depression meds are being prescribed by OBGYN at this time.         CM met with pt and FOB at bedside. Pt communicated throughout discussing using hand gestures, body language, facial expressions and head nods, she did not say anything during the assessment. Pt nodded yes that FOB could stay in the room during consult in which medical information would be shared. DSS called for pos UDS result, case was accepted for investigation. Pt and FOB informed, and told to expect a CPS worker to meet with them before dc and discuss process. Father  expresses understanding. Pt given resources at last admissions by case manager, CM will provide again if pt needs. CM will continue to follow.

## 2022-12-07 MED FILL — DOCUSATE SODIUM 100 MG PO CAPS: 100 MG | ORAL | Qty: 1

## 2022-12-07 MED FILL — CLONAZEPAM 1 MG PO TABS: 1 MG | ORAL | Qty: 1

## 2022-12-07 MED FILL — FLUOXETINE HCL 10 MG PO CAPS: 10 MG | ORAL | Qty: 3

## 2022-12-07 MED FILL — OXYCODONE HCL 5 MG PO TABS: 5 MG | ORAL | Qty: 2

## 2022-12-07 MED FILL — ACETAMINOPHEN EXTRA STRENGTH 500 MG PO TABS: 500 MG | ORAL | Qty: 2

## 2022-12-07 MED FILL — IBUPROFEN 800 MG PO TABS: 800 MG | ORAL | Qty: 1

## 2022-12-07 MED FILL — OXYCODONE HCL 5 MG PO TABS: 5 MG | ORAL | Qty: 1

## 2022-12-07 NOTE — Discharge Summary (Signed)
Discharge Summary    Date: 12/07/2022  Patient Name: [name] [name]    Date of Birth: [birth year]     Age: 37 y.o.    Admit Date: 12/04/2022  Discharge Date: 12/07/2022  Discharge Condition: Good    Admission Diagnosis  Previous cesarean section [H06.237];Previous cesarean delivery affecting pregnancy [O34.219]      Discharge Diagnosis  Principal Problem:    Previous cesarean section  Resolved Problems:    * No resolved hospital problems. [facility] Stay  Narrative of Hospital Course:  Admitted for ERCD @ 39wks  ERCD performed without complication  D/c home POD #3  Hx of benzo use during pregnancy for anxiety prescribed by me    Consultants:  IP CONSULT TO LACTATION    Surgeries/procedures Performed:      Treatments:            Discharge Plan/Disposition:  Home    Hospital/Incidental Findings Requiring Follow Up:    Patient Instructions:    Diet:    Activity:No Driving for 2 Weeks, No Driving for 3 Weeks and No Sex for  For number of days (if applicable):      Other Instructions:    Provider Follow-Up:   No follow-ups on file.     Significant Diagnostic Studies:    Recent Labs:  Admission on 12/04/2022  WBC                                           Date: 12/04/2022  Value: 9.5         Ref range: 3.8 - 10.6 x10e3*  Status: Final  RBC                                           Date: 12/04/2022  Value: 3.46 (L)    Ref range: 3.60 - 5.20 x10e*  Status: Final  Hemoglobin                                    Date: 12/04/2022  Value: 11.4 (L)    Ref range: 11.5 - 15.7 g/dL   Status: Final  Hematocrit                                    Date: 12/04/2022  Value: 33.5 (L)    Ref range: 34.0 - 47.0 %      Status: Final  MCV                                           Date: 12/04/2022  Value: 96.8        Ref range: 81.0 - 99.0 fL     Status: Final  MCH                                           Date: 12/04/2022  Value: 32.9  Ref range: 27.0 - 34.5 pg     Status: Final  MCHC                                          Date:  12/04/2022  Value: 34.0        Ref range: 30.0 - 36.0 g/dL   Status: Final  RDW                                           Date: 12/04/2022  Value: 12.4        Ref range: 10.0 - 17.0 %      Status: Final  Platelets                                     Date: 12/04/2022  Value: 315         Ref range: 140 - 440 x10e3/*  Status: Final  MPV                                           Date: 12/04/2022  Value: 9.2         Ref range: 7.0 - 12.2 fL      Status: Final  RPR                                           Date: 12/04/2022  Value: Non-Reactive                     Ref range: Non-Reactive       Status: Final  Amphetamine Screen, Ur                        Date: 12/04/2022  Value: Negative    Ref range: Negative           Status: Final  Benzodiazepine Screen, Urine                  Date: 12/04/2022  Value: Positive (A) Ref range: Negative           Status: Final  Barbiturate Screen, Ur                        Date: 12/04/2022  Value: Negative    Ref range: Negative           Status: Final  Cannabinoid Scrn, Ur                          Date: 12/04/2022  Value: Positive (A) Ref range: Negative           Status: Final  Opiate Screen, Urine                          Date: 12/04/2022  Value: Negative    Ref range: Negative           Status:  Final  Fentanyl, Ur                                  Date: 12/04/2022  Value: Negative    Ref range: Negative           Status: Final  Cocaine Screen Urine                          Date: 12/04/2022  Value: Negative    Ref range: Negative           Status: Final                Comment: DRUG SCREEN INTERPRETATION:  Effective 12/26/2021: Interpretation Revised    The submitted urine specimen was tested at the following cutoffs:    Drug Class                         Cutoff Level  ----------------------------------------------  Amphetamines                       1000 ng/mL  Barbiturates                        200 ng/mL  Cocaine Metabolites                 300 ng/mL  Marijuana Metabolites                 50 ng/mL  Opiates                             300 ng/mL  Benzodiazepine                      100 ng/mL  Fentanyl                              5 ng/mL    * Specimen was received without chain of custody and may not have *  * been handled as a legal specimen. Results should be used for    *  * medical purposes only and not for any legal or employment       *  * evaluative purposes.                                            *  *                                                                 *  * This test provides only a presumptive screening test result.    *  * The Opiate                          drug class detects morphine and its metabolites,     *  * heroin, and codeine. It does not detect oxycodone,  meperidine   *  * or fentanyl. Elevated urine specific gravity, low urine pH, and *  * other interfering substances can cause a false positive result. *  * If positive results do not correlate with the clinical          *  * situation, a confirmation is strongly advised.                  *  * A more specific method, Gas Chromotography/Mass Spectrometry    *  * (GC/MS) is the preferred confirmatory method. Sample will be    *  * sent to reference laboratory for confirmation upon request      *    ABO/RH Method                                 Date: 12/04/2022  Value: Automated     Status: Final  Antibody Screen, Method                       Date: 12/04/2022  Value: Automated     Status: Final  ABO/Rh                                        Date: 12/04/2022  Value: B NEG         Status: Final  Antibody Screen                               Date: 12/04/2022  Value: Positive      Status: Final  Antibody ID                                   Date: 12/04/2022  Value: Positive      Status: Final                Comment: 12/04/2022 13:04  [id number]  Passively acquired Anti-D due to the administration of antenatal Rh Immune  Globulin.  All other clinically significant alloantibodies ruled out    WBC                                            Date: 12/05/2022  Value: 11.5 (H)    Ref range: 3.8 - 10.6 x10e3*  Status: Final  RBC                                           Date: 12/05/2022  Value: 3.09 (L)    Ref range: 3.60 - 5.20 x10e*  Status: Final  Hemoglobin                                    Date: 12/05/2022  Value: 10.0 (L)    Ref range: 11.5 - 15.7 g/dL   Status: Final  Hematocrit  Date: 12/05/2022  Value: 30.6 (L)    Ref range: 34.0 - 47.0 %      Status: Final  MCV                                           Date: 12/05/2022  Value: 99.0        Ref range: 81.0 - 99.0 fL     Status: Final  MCH                                           Date: 12/05/2022  Value: 32.4        Ref range: 27.0 - 34.5 pg     Status: Final  MCHC                                          Date: 12/05/2022  Value: 32.7        Ref range: 30.0 - 36.0 g/dL   Status: Final  RDW                                           Date: 12/05/2022  Value: 12.3        Ref range: 10.0 - 17.0 %      Status: Final  Platelets                                     Date: 12/05/2022  Value: 269         Ref range: 140 - 440 x10e3/*  Status: Final  MPV                                           Date: 12/05/2022  Value: 9.1         Ref range: 7.0 - 12.2 fL      Status: Final  Fetal Screen Interpretation                   Date: 12/05/2022  Value: Negative      Status: Final  ------------    Radiology last 7 days:  No results found.     Pending Labs     Order Current Status    ABO/RH In process    ABO/RH In process    ANTIBODY SCREEN In process    TYPE AND SCREEN In process        Discharge Medications    Current Discharge Medication List    START taking these medications    oxyCODONE (ROXICODONE) 5 MG immediate release tablet  Take 1 tablet by mouth every 6 hours as needed for Pain for up to 3 days. Max Daily Amount: 20 mg  Qty: 12 tablet Refills: 0  Comments: Reduce doses taken as pain becomes manageable  Associated Diagnoses:Status post repeat low transverse  cesarean section    ibuprofen (ADVIL;MOTRIN) 800 MG tablet  Take  1 tablet by mouth in the morning and 1 tablet at noon and 1 tablet in the evening.  Qty: 60 tablet Refills: 1    naloxone (NARCAN) 0.4 MG/ML injection  Infuse 0.5 mLs intravenously as needed (Itching, opiod withdrawl)  Qty: 1 mL Refills: 1    docusate sodium (COLACE, DULCOLAX) 100 MG CAPS  Take 100 mg by mouth 2 times daily  Qty: 60 capsule Refills: 1          Current Discharge Medication List        Current Discharge Medication List    CONTINUE these medications which have NOT CHANGED    sucralfate (CARAFATE) 1 GM tablet  Take 1 tablet by mouth in the morning and 1 tablet in the evening.    famotidine (PEPCID) 40 MG tablet  Take 1 tablet by mouth every evening  Qty: 30 tablet Refills: 3    Prenatal Vit-Fe Fumarate-FA (PRENATAL VITAMINS) 28-0.8 MG TABS  Take 1 tablet by mouth daily  Qty: 30 tablet Refills: 0    clonazePAM (KLONOPIN) 1 MG tablet  Take 1 tablet by mouth in the morning and at bedtime for 30 days. Max Daily Amount: 2 mg  Qty: 60 tablet Refills: 0  Associated Diagnoses:Anxiety    FLUoxetine (PROZAC) 10 MG capsule  Take 3 capsules by mouth daily Please give 20mg  tablet and 10 mg tablet for total of 30mg   Qty: 90 capsule Refills: 0  Associated Diagnoses:Anxiety          Current Discharge Medication List    STOP taking these medications    promethazine (PHENERGAN) 25 MG tablet  Comments:  Reason for Stopping:    benzocaine-menthol (CEPACOL SORE THROAT) 15-3.6 MG lozenge  Comments:  Reason for Stopping:    aluminum & magnesium hydroxide-simethicone (MAALOX) 200-200-20 MG/5ML SUSP suspension  Comments:  Reason for Stopping:    ondansetron (ZOFRAN-ODT) 4 MG disintegrating tablet  Comments:  Reason for Stopping:          Time Spent on Discharge:  minutes were spent in patient examination, evaluation, counseling as well as medication reconciliation, prescriptions for required medications, discharge plan, and follow up.    Electronically signed by  Junius Finner, MD on 12/07/22 at 7:40 AM EDT

## 2022-12-12 ENCOUNTER — Encounter

## 2022-12-12 ENCOUNTER — Ambulatory Visit
Admit: 2022-12-12 | Discharge: 2022-12-12 | Payer: MEDICAID | Attending: Obstetrics & Gynecology | Primary: Family Medicine

## 2022-12-12 DIAGNOSIS — O169 Unspecified maternal hypertension, unspecified trimester: Secondary | ICD-10-CM

## 2022-12-12 LAB — COMPREHENSIVE METABOLIC PANEL
ALT: 35 U/L (ref 0–35)
AST: 22 U/L (ref 0–35)
Albumin/Globulin Ratio: 1.3 (ref 1.00–2.70)
Albumin: 3.5 g/dL (ref 3.5–5.2)
Alk Phosphatase: 110 U/L (ref 35–117)
Anion Gap: 10 mmol/L (ref 2–17)
BUN: 12 mg/dL (ref 6–20)
CO2: 25 mmol/L (ref 22–29)
Calcium: 9.1 mg/dL (ref 8.5–10.7)
Chloride: 107 mmol/L (ref 98–107)
Creatinine: 0.8 mg/dL (ref 0.5–1.0)
Est, Glom Filt Rate: 98 mL/min/1.73mÂ² (ref >=60)
Globulin: 2.8 g/dL (ref 1.9–4.4)
Glucose: 99 mg/dL (ref 70–99)
Osmolaliy Calculated: 283 mosm/kg (ref 270–287)
Potassium: 4.6 mmol/L (ref 3.5–5.3)
Sodium: 142 mmol/L (ref 135–145)
Total Bilirubin: 0.26 mg/dL (ref 0.00–1.20)
Total Protein: 6.3 g/dL (ref 5.7–8.3)

## 2022-12-12 LAB — CBC
Hematocrit: 36.4 % (ref 34.0–47.0)
Hemoglobin: 12.5 g/dL (ref 11.5–15.7)
MCH: 32.1 pg (ref 27.0–34.5)
MCHC: 34.3 g/dL (ref 30.0–36.0)
MCV: 93.6 fL (ref 81.0–99.0)
MPV: 9.6 fL (ref 7.0–12.2)
NRBC Absolute: 0 10*3/uL (ref 0.000–0.012)
NRBC Automated: 0 % (ref 0.0–0.2)
Platelets: 426 10*3/uL (ref 140–440)
RBC: 3.89 x10e6/mcL (ref 3.60–5.20)
RDW: 12.5 % (ref 10.0–17.0)
WBC: 9.6 10*3/uL (ref 3.8–10.6)

## 2022-12-12 LAB — PROTEIN / CREATININE RATIO, URINE
Creatinine, Ur: 55.1 mg/dL (ref 28.0–217.0)
Prot/Creat Ratio, Ur: 0.98 ratio
Protein, Urine: 54 mg/dL — ABNORMAL HIGH (ref 0.0–13.5)

## 2022-12-12 LAB — URIC ACID: Uric Acid: 4.6 mg/dL (ref 2.4–5.7)

## 2022-12-12 LAB — LACTATE DEHYDROGENASE: LD: 302 U/L — ABNORMAL HIGH (ref 135–214)

## 2022-12-12 NOTE — Telephone Encounter (Signed)
Dr Akinbote please advise.

## 2022-12-12 NOTE — Telephone Encounter (Signed)
Pt called and stated that she came in today and was tested for Preeclampsia. She got the results back through MyChart and one of the results are extremely high. Requesting a cb from nurse.

## 2022-12-12 NOTE — Progress Notes (Signed)
[name] [name] (DOB:  06/25/1985) is a 37 y.o. female,Established patient, here for evaluation of the following chief complaint(s):  Postpartum Care (1 week post op c/s )         Assessment & Plan  Elevated blood pressure affecting pregnancy, antepartum   Obtain labs  reassurance  BP precautions  F/u 1 wk    Orders:    CBC; Future    Comprehensive Metabolic Panel; Future    Protein / Creatinine Ratio, Urine; Future    Uric Acid; Future    Lactate Dehydrogenase; Future      No follow-ups on file.       Subjective   HPI  37 y/o presents with elevated Bps. Not on BP meds. Asymptomatic. She has a lot of stressors. Relationship issues with her partner who left. No transportation. Medicaid bus. + THC in hospital with DSS protection plan which requires having to get tested. HX of anxiety disorder.   Review of Systems   Review of Systems - General ROS: negative  Breast ROS: negative for breast lumps  Respiratory ROS: no cough, shortness of breath, or wheezing  Cardiovascular ROS: no chest pain or dyspnea on exertion  Gastrointestinal ROS: no abdominal pain, change in bowel habits, or black or bloody stools  Genito-Urinary ROS: no dysuria, trouble voiding, or hematuria  Musculoskeletal ROS: negative      Objective   Blood pressure (!) 142/100, height 1.6 m (5' 2.99"), weight 85.9 kg (189 lb 7.5 oz), last menstrual period 03/11/2022, currently breastfeeding.      On this date 12/12/2022 I have spent 30 minutes reviewing previous notes, test results and face to face with the patient discussing the diagnosis and importance of compliance with the treatment plan as well as documenting on the day of the visit.      An electronic signature was used to authenticate this note.    --Junius Finner, MD

## 2022-12-13 NOTE — Telephone Encounter (Signed)
Dr. Akinbote spoke to pt.

## 2022-12-30 ENCOUNTER — Encounter: Admit: 2022-12-30 | Discharge: 2022-12-30 | Payer: MEDICAID | Attending: Family Medicine | Primary: Family Medicine

## 2022-12-30 DIAGNOSIS — F419 Anxiety disorder, unspecified: Secondary | ICD-10-CM

## 2022-12-30 MED ORDER — CEPHALEXIN 500 MG PO CAPS
500 MG | ORAL_CAPSULE | Freq: Three times a day (TID) | ORAL | 0 refills | Status: AC
Start: 2022-12-30 — End: 2023-01-29

## 2022-12-30 MED ORDER — FLUOXETINE HCL 10 MG PO CAPS
10 | ORAL_CAPSULE | Freq: Every day | ORAL | 2 refills | Status: AC
Start: 2022-12-30 — End: ?

## 2022-12-30 NOTE — Progress Notes (Unsigned)
CHIEF COMPLAINT:  Chief Complaint   Patient presents with   . Medication Check     Having a lot of issues since C-section on 10/2        HISTORY OF PRESENT ILLNESS:  [name] [name] is a 37 y.o. female  who presents for follow up on depression with anxiety.    During her recent pregnancy, she was getting her medications through her OBGYN.    She has severe problems with her diagnoses, and it was considered clinically necessary to continue her treatments, which include fluoxetine and klonopin.      Having increase in pain and discharge at site of c-section.  Worse past 2 days.  No know fevers, sometimes feels hot.      No urinary symptoms  Some vaginal discharge noted, somewhat orange.  Mucusy.          PHQ:      05/13/2022     1:08 PM   PHQ-9    Little interest or pleasure in doing things 0   Feeling down, depressed, or hopeless 0   Trouble falling or staying asleep, or sleeping too much 0   Feeling tired or having little energy 0   Poor appetite or overeating 0   Feeling bad about yourself - or that you are a failure or have let yourself or your family down 0   Trouble concentrating on things, such as reading the newspaper or watching television 0   Moving or speaking so slowly that other people could have noticed. Or the opposite - being so fidgety or restless that you have been moving around a lot more than usual 0   Thoughts that you would be better off dead, or of hurting yourself in some way 0   PHQ-2 Score 0   PHQ-9 Total Score 0   If you checked off any problems, how difficult have these problems made it for you to do your work, take care of things at home, or get along with other people? 0       CURRENT MEDICATION LIST:    Current Outpatient Medications   Medication Sig Dispense Refill   . clonazePAM (KLONOPIN) 1 MG tablet Take 1 tablet by mouth in the morning and at bedtime for 30 days. Max Daily Amount: 2 mg 60 tablet 0   . FLUoxetine (PROZAC) 10 MG capsule Take 3 capsules by mouth daily Please give 20mg  tablet  and 10 mg tablet for total of 30mg  90 capsule 0   . ibuprofen (ADVIL;MOTRIN) 800 MG tablet Take 1 tablet by mouth in the morning and 1 tablet at noon and 1 tablet in the evening. 60 tablet 1   . naloxone (NARCAN) 0.4 MG/ML injection Infuse 0.5 mLs intravenously as needed (Itching, opiod withdrawl) 1 mL 1   . docusate sodium (COLACE, DULCOLAX) 100 MG CAPS Take 100 mg by mouth 2 times daily 60 capsule 1   . sucralfate (CARAFATE) 1 GM tablet Take 1 tablet by mouth in the morning and 1 tablet in the evening.     . famotidine (PEPCID) 40 MG tablet Take 1 tablet by mouth every evening 30 tablet 3   . Prenatal Vit-Fe Fumarate-FA (PRENATAL VITAMINS) 28-0.8 MG TABS Take 1 tablet by mouth daily 30 tablet 0     No current facility-administered medications for this visit.        ALLERGIES:    Allergies   Allergen Reactions   . Doxycycline Anaphylaxis   . Doxycycline Calcium Anaphylaxis   .  Levofloxacin Anaphylaxis and Other (See Comments)        HISTORY:  Past Medical History:   Diagnosis Date   . Anxiety    . Crohn's colitis (HCC)    . Depression       Past Surgical History:   Procedure Laterality Date   . CESAREAN SECTION  08/22/2014   . CESAREAN SECTION N/A 12/04/2022    CESAREAN SECTION performed by Junius Finner, MD at [facility] OR   . CHOLECYSTECTOMY  2021   . TONSILLECTOMY  2009   . WISDOM TOOTH EXTRACTION  03/04/2010      Social History     Socioeconomic History   . Marital status: Single     Spouse name: Not on file   . Number of children: Not on file   . Years of education: Not on file   . Highest education level: Not on file   Occupational History   . Not on file   Tobacco Use   . Smoking status: Former     Current packs/day: 0.00     Average packs/day: 0.5 packs/day for 5.0 years (2.5 ttl pk-yrs)     Types: Cigarettes     Start date: 12/03/2015     Quit date: 12/2020     Years since quitting: 2.0   . Smokeless tobacco: Never   Vaping Use   . Vaping status: Never Used   Substance and Sexual Activity   . Alcohol  use: Not Currently   . Drug use: Never   . Sexual activity: Yes     Partners: Male   Other Topics Concern   . Not on file   Social History Narrative   . Not on file     Social Determinants of Health     Financial Resource Strain: Not on file   Food Insecurity: No Food Insecurity (12/04/2022)    Hunger Vital Sign    . Worried About Programme researcher, broadcasting/film/video in the Last Year: Never true    . Ran Out of Food in the Last Year: Never true   Transportation Needs: No Transportation Needs (12/04/2022)    PRAPARE - Transportation    . Lack of Transportation (Medical): No    . Lack of Transportation (Non-Medical): No   Physical Activity: Not on file   Stress: Not on file   Social Connections: Not on file   Intimate Partner Violence: Not on file   Housing Stability: High Risk (12/04/2022)    Housing Stability Vital Sign    . Unable to Pay for Housing in the Last Year: No    . Number of Times Moved in the Last Year: 2    . Homeless in the Last Year: No      Family History   Problem Relation Age of Onset   . Unknown Father    . Breast Cancer Mother         REVIEW OF SYSTEMS:  Review of Systems     PHYSICAL EXAM:  Vital Signs -   Visit Vitals  BP 132/80 (Site: Left Upper Arm, Position: Sitting, Cuff Size: Large Adult)   Pulse 63   Temp 98 F (36.7 C) (Oral)   Wt 82.9 kg (182 lb 12.8 oz)   SpO2 99%   BMI 32.39 kg/m        Physical Exam       LABS  No results found for this visit on 12/30/22.  Orders Only on 12/12/2022   Component  Date Value Ref Range Status   . LD 12/12/2022 302 (H)  135 - 214 unit/L Final   . Uric Acid 12/12/2022 4.6  2.4 - 5.7 mg/dL Final   . Sodium 16/12/9602 142  135 - 145 mmol/L Final   . Potassium 12/12/2022 4.6  3.5 - 5.3 mmol/L Final   . Chloride 12/12/2022 107  98 - 107 mmol/L Final   . CO2 12/12/2022 25  22 - 29 mmol/L Final   . Glucose 12/12/2022 99  70 - 99 mg/dL Final   . BUN 54/11/8117 12  6 - 20 mg/dL Final   . Creatinine 14/78/2956 0.8  0.5 - 1.0 mg/dL Final   . Anion Gap 21/30/8657 10  2 - 17 mmol/L  Final   . Osmolaliy Calculated 12/12/2022 283  270 - 287 mOsm/kg Final   . Calcium 12/12/2022 9.1  8.5 - 10.7 mg/dL Final   . Total Protein 12/12/2022 6.3  5.7 - 8.3 g/dL Final   . Albumin 84/69/6295 3.5  3.5 - 5.2 g/dL Final   . Globulin 28/41/3244 2.8  1.9 - 4.4 g/dL Final   . Albumin/Globulin Ratio 12/12/2022 1.30  1.00 - 2.70 Final   . Total Bilirubin 12/12/2022 0.26  0.00 - 1.20 mg/dL Final   . Alk Phosphatase 12/12/2022 110  35 - 117 unit/L Final   . AST 12/12/2022 22  0 - 35 unit/L Final   . ALT 12/12/2022 35  0 - 35 unit/L Final   . Est, Glom Filt Rate 12/12/2022 98  >=60 mL/min/1.38m Final    Comment: VERIFIED by Discern Expert.  GFR Interpretation:                                                                         % OF  KIDNEY  GFR                                                        STAGE  FUNCTION  ==================================================================================    > 90        Normal kidney function                       STAGE 1  90-100%  89 to 60      Mild loss of kidney function                 STAGE 2  80-60%  59 to 45      Mild to moderate loss of kidney function     STAGE 3a  59-45%  44 to 30      Moderate to severe loss of kidney function   STAGE 3b  44-30%  29 to 15      Severe loss of kidney function               STAGE 4  29-15%    < 15        Kidney failure  STAGE 5  <15%  ==================================================================================  Modified from [employer]    GFR Calculation performed using the CKD-EPI 2021 equation developed for use  with IDMS traceable creatinine methods and                            is the calculation recommended by  the [facility] for estimating GFR in adults.     . WBC 12/12/2022 9.6  3.8 - 10.6 x10e3/mcL Final   . RBC 12/12/2022 3.89  3.60 - 5.20 x10e6/mcL Final   . Hemoglobin 12/12/2022 12.5  11.5 - 15.7 g/dL Final   . Hematocrit 29/51/8841 36.4  34.0 - 47.0 %  Final   . MCV 12/12/2022 93.6  81.0 - 99.0 fL Final   . MCH 12/12/2022 32.1  27.0 - 34.5 pg Final   . MCHC 12/12/2022 34.3  30.0 - 36.0 g/dL Final   . RDW 66/08/3014 12.5  10.0 - 17.0 % Final   . Platelets 12/12/2022 426  140 - 440 x10e3/mcL Final   . MPV 12/12/2022 9.6  7.0 - 12.2 fL Final   . NRBC Automated 12/12/2022 0.0  0.0 - 0.2 % Final   . NRBC Absolute 12/12/2022 0.000  0.000 - 0.012 x10e3/mcL Final   . Creatinine, Ur 12/12/2022 55.1  28.0 - 217.0 mg/dL Final   . Protein, Urine 12/12/2022 54.0 (H)  0.0 - 13.5 mg/dL Final   . Prot/Creat Ratio, Ur 12/12/2022 0.98  ratio Final       IMPRESSION/PLAN    {There are no diagnoses linked to this encounter. (Refresh or delete this SmartLink)}       Follow up and Dispositions:  No follow-ups on file.       Rockwell Alexandria, MD

## 2022-12-30 NOTE — Telephone Encounter (Signed)
Spoke to patient and offered appt for tomorrow. Patient states she needs 3 days to let transportation know she needs a ride and cannot come into the office for at least three days.     Patient requesting Dr [name] [name] look at office visit from earlier today, uploaded picture and advise

## 2022-12-30 NOTE — Telephone Encounter (Signed)
Dr Haskel Schroeder views uploaded picture and states he thinks incision is healing well. Patient verbalized understanding and will call back for pp appt  for next week

## 2022-12-30 NOTE — Telephone Encounter (Signed)
Patient delivered on 10/02 via c-section and was last seen for a PP visit 10/10. She is at her PCP office complaining of discomfort. The incision is red but she does not have a fever. [name] [name] is requesting a return call to the patient to get her scheduled asap. Please assist.

## 2023-01-02 ENCOUNTER — Encounter

## 2023-01-03 MED ORDER — CLONAZEPAM 1 MG PO TABS
1 MG | ORAL_TABLET | Freq: Two times a day (BID) | ORAL | 2 refills | Status: DC
Start: 2023-01-03 — End: 2023-02-04

## 2023-01-03 NOTE — Addendum Note (Signed)
Addended by: Daivd Council E on: 01/03/2023 02:19 PM     Modules accepted: Orders

## 2023-01-29 ENCOUNTER — Encounter: Admit: 2023-01-29 | Payer: MEDICAID | Admitting: Family Medicine | Primary: Family Medicine

## 2023-01-29 VITALS — BP 104/70 | HR 68 | Temp 97.50000°F | Ht 63.0 in | Wt 192.4 lb

## 2023-01-29 DIAGNOSIS — F418 Other specified anxiety disorders: Secondary | ICD-10-CM

## 2023-01-29 MED ORDER — BUPROPION HCL ER (SMOKING DET) 150 MG PO TB12
150 MG | ORAL_TABLET | Freq: Every day | ORAL | 0 refills | Status: DC
Start: 2023-01-29 — End: 2023-03-02

## 2023-01-29 NOTE — Progress Notes (Signed)
 CHIEF COMPLAINT:  Chief Complaint   Patient presents with    Anxiety        HISTORY OF PRESENT ILLNESS:  [name] [name] is a 37 y.o. female  who presents for mood follow up.  She is a new mom to child #2.  She has an older daughter who has some disabilities.  S

## 2023-02-04 ENCOUNTER — Telehealth: Admit: 2023-02-04 | Admitting: Family Medicine

## 2023-02-04 DIAGNOSIS — F419 Anxiety disorder, unspecified: Secondary | ICD-10-CM

## 2023-02-04 MED ORDER — CLONAZEPAM 1 MG PO TABS
1 MG | ORAL_TABLET | Freq: Two times a day (BID) | ORAL | 0 refills | Status: AC
Start: 2023-02-04 — End: 2023-03-06

## 2023-02-04 NOTE — Telephone Encounter (Signed)
 PT. Is requesting her clonazepam rx to be sent to [address], [place], [place], due to her current pharmacy being out of her medication refill as of Monday, and they don't know when they're going to have it stock .Let me know what the next st

## 2023-02-04 NOTE — Telephone Encounter (Signed)
 Relayed message

## 2023-02-04 NOTE — Telephone Encounter (Signed)
 I moved the script to [facility].  I sent only one month in case she prefers to return to [facility] next [facility].

## 2023-03-01 ENCOUNTER — Encounter

## 2023-03-03 ENCOUNTER — Encounter: Admit: 2023-03-03 | Admitting: Family Medicine

## 2023-03-03 DIAGNOSIS — F418 Other specified anxiety disorders: Secondary | ICD-10-CM

## 2023-03-03 MED ORDER — BUPROPION HCL ER (SMOKING DET) 150 MG PO TB12
150 MG | ORAL_TABLET | Freq: Every day | ORAL | 0 refills | Status: DC
Start: 2023-03-03 — End: 2023-04-03

## 2023-03-07 MED ORDER — AMOXICILLIN 875 MG PO TABS
875 | ORAL_TABLET | Freq: Two times a day (BID) | ORAL | 0 refills | Status: AC
Start: 2023-03-07 — End: 2023-03-17

## 2023-03-11 MED ORDER — FLUOXETINE HCL 20 MG PO CAPS
20 | ORAL_CAPSULE | Freq: Every day | ORAL | 0 refills | Status: DC
Start: 2023-03-11 — End: 2023-05-29

## 2023-03-11 NOTE — Telephone Encounter (Signed)
 Pt. Called back to return a call regarding this.
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
## 2023-03-11 NOTE — Telephone Encounter (Signed)
 Left message for patient as well on her voicemail asking her to call.  If she is struggling and has been sent home from the ER, we need to try to get her in touch with another resource to help if she is still feeling this way.

## 2023-03-12 NOTE — Telephone Encounter (Signed)
Sw pt and advised Dr Kandee Keen has followed up with her questions on the portal.

## 2023-03-12 NOTE — Telephone Encounter (Signed)
Please check with patient as she has not gotten her message yet.

## 2023-04-02 ENCOUNTER — Telehealth: Admit: 2023-04-02 | Discharge: 2023-04-02 | Payer: MEDICAID | Attending: Family Medicine | Primary: Family Medicine

## 2023-04-02 ENCOUNTER — Encounter

## 2023-04-02 DIAGNOSIS — J101 Influenza due to other identified influenza virus with other respiratory manifestations: Secondary | ICD-10-CM

## 2023-04-02 MED ORDER — BENZONATATE 200 MG PO CAPS
200 | ORAL_CAPSULE | Freq: Three times a day (TID) | ORAL | 0 refills | Status: AC | PRN
Start: 2023-04-02 — End: 2023-04-09

## 2023-04-02 MED ORDER — FLUOXETINE HCL 40 MG PO CAPS
40 MG | ORAL_CAPSULE | Freq: Every day | ORAL | 3 refills | Status: DC
Start: 2023-04-02 — End: 2023-05-28

## 2023-04-02 MED ORDER — GUAIFENESIN-CODEINE 100-10 MG/5ML PO SOLN
100-10 | Freq: Three times a day (TID) | ORAL | 0 refills | Status: AC | PRN
Start: 2023-04-02 — End: 2023-04-07

## 2023-04-02 MED ORDER — PROMETHAZINE-DM 6.25-15 MG/5ML PO SYRP
6.25-15 | Freq: Four times a day (QID) | ORAL | 0 refills | Status: AC | PRN
Start: 2023-04-02 — End: 2023-04-09

## 2023-04-02 NOTE — Progress Notes (Signed)
[name] [name], was evaluated through a synchronous (real-time) audio-video encounter. The patient (or guardian if applicable) is aware that this is a billable service, which includes applicable co-pays. This Virtual Visit was conducted with patient's (and/or legal guardian's) consent. Patient identification was verified, and a caregiver was present when appropriate.   The patient was located at Home: [address]  [place] [address]  Provider was located at [facility] (Appt Dept): [address] [place],  [place] [address]  Confirm you are appropriately licensed, registered, or certified to deliver care in the state where the patient is located as indicated above. If you are not or unsure, please re-schedule the visit: Yes, I confirm.     [name] [name] (DOB:  [birth year]) is a Established patient, presenting virtually for evaluation of the following:      Below is the assessment and plan developed based on review of pertinent history, physical exam, labs, studies, and medications.     Assessment & Plan  Influenza A   New, not at goal (unstable), changes made today: meds added as noted.  Pt indicates that she got no  medications of any kind to help at ER.  She has now been sick a little too long for antivirals.  Discussed precautions for further follow up.    Orders:    benzonatate (TESSALON) 200 MG capsule; Take 1 capsule by mouth 3 times daily as needed for Cough    promethazine-dextromethorphan (PROMETHAZINE-DM) 6.25-15 MG/5ML syrup; Take 5 mLs by mouth 4 times daily as needed for Cough    Anxiety       Orders:    clonazePAM (KLONOPIN) 1 MG tablet; Take 1 tablet by mouth in the morning and at bedtime for 60 days. Max Daily Amount: 2 mg      Return in about 8 weeks (around 05/28/2023).       Subjective   HPI  Patient presents virtually for a follow up visit.  She has a concern of cough and congestion.  Pt tested positive for flu A - went to ER on Monday.    Regarding anxiety  medication, she feels like increase in fluoxetine is helpful.      Review of Systems   Constitutional:  Positive for fever.   HENT:  Positive for congestion.    Respiratory:  Positive for cough. Negative for shortness of breath.    Cardiovascular:  Negative for chest pain.          Objective   Patient-Reported Vitals  Patient-Reported Height: 5 FEET 3 IN       Physical Exam  Constitutional:       General: She is not in acute distress.     Appearance: She is ill-appearing.   Pulmonary:      Effort: No respiratory distress.      Comments: Cough noted  Neurological:      General: No focal deficit present.      Mental Status: She is alert.                  --Rockwell Alexandria, MD

## 2023-04-03 MED ORDER — CLONAZEPAM 1 MG PO TABS
1 MG | ORAL_TABLET | Freq: Two times a day (BID) | ORAL | 1 refills | Status: DC
Start: 2023-04-03 — End: 2023-05-28

## 2023-04-03 NOTE — Assessment & Plan Note (Signed)
Orders:    clonazePAM (KLONOPIN) 1 MG tablet; Take 1 tablet by mouth in the morning and at bedtime for 60 days. Max Daily Amount: 2 mg

## 2023-05-28 ENCOUNTER — Encounter

## 2023-05-29 MED ORDER — CLONAZEPAM 1 MG PO TABS
1 MG | ORAL_TABLET | Freq: Two times a day (BID) | ORAL | 1 refills | Status: DC
Start: 2023-05-29 — End: 2023-08-06

## 2023-05-29 MED ORDER — FLUOXETINE HCL 40 MG PO CAPS
40 MG | ORAL_CAPSULE | Freq: Every day | ORAL | 3 refills | Status: DC
Start: 2023-05-29 — End: 2023-08-06

## 2023-07-30 ENCOUNTER — Encounter: Attending: Family Medicine | Primary: Family Medicine

## 2023-08-06 ENCOUNTER — Telehealth: Admit: 2023-08-06 | Discharge: 2023-08-06 | Payer: MEDICAID | Attending: Family Medicine | Primary: Family Medicine

## 2023-08-06 DIAGNOSIS — F418 Other specified anxiety disorders: Secondary | ICD-10-CM

## 2023-08-06 MED ORDER — FLUOXETINE HCL 40 MG PO CAPS
40 | ORAL_CAPSULE | Freq: Every day | ORAL | 3 refills | 90.00 days | Status: AC
Start: 2023-08-06 — End: ?

## 2023-08-06 MED ORDER — CLONAZEPAM 1 MG PO TABS
1 | ORAL_TABLET | Freq: Two times a day (BID) | ORAL | 1 refills | 30.00 days | Status: AC
Start: 2023-08-06 — End: 2023-10-05

## 2023-08-06 NOTE — Assessment & Plan Note (Signed)
 Chronic, at goal (stable), continue current treatment plan    Orders:    FLUoxetine (PROZAC) 40 MG capsule; Take 1 capsule by mouth daily

## 2023-08-06 NOTE — Progress Notes (Signed)
 [name] [name], was evaluated through a synchronous (real-time) audio-video encounter. The patient (or guardian if applicable) is aware that this is a billable service, which includes applicable co-pays. This Virtual Visit was conducted with patient's (and/or legal guardian's) consent. Patient identification was verified, and a caregiver was present when appropriate.   The patient was located at Home: [address]  [place] [address]  Provider was located at [facility] (Appt Dept): [address] [place],  [place] [address]  Confirm you are appropriately licensed, registered, or certified to deliver care in the state where the patient is located as indicated above. If you are not or unsure, please re-schedule the visit: Yes, I confirm.     [name] [name] (DOB:  Jul 22, 1985) is a Established patient, presenting virtually for evaluation of the following:      Below is the assessment and plan developed based on review of pertinent history, physical exam, labs, studies, and medications.     Assessment & Plan  Mixed anxiety and depressive disorder   Chronic, at goal (stable), continue current treatment plan    Orders:    FLUoxetine  (PROZAC ) 40 MG capsule; Take 1 capsule by mouth daily    Anxiety   Chronic, at goal (stable), continue current treatment plan    Orders:    clonazePAM  (KLONOPIN ) 1 MG tablet; Take 1 tablet by mouth in the morning and at bedtime for 60 days. Max Daily Amount: 2 mg      Assessment & Plan  1. Anxiety.  - Anxiety is well-managed with the current medication regimen of fluoxetine  and clonazepam .  - Reports feeling tired, which may be contributing to occasional flat moods.  - No immediate changes to her medication are necessary; advised to monitor symptoms.  - Prescriptions for fluoxetine  and clonazepam  have been renewed and sent to the pharmacy. Encouraged to contact the office if any issues arise.       Return in about 6 months (around 02/05/2024) for Follow-up on  chronic medical conditions.       Subjective   HPI  History of Present Illness  The patient presents via virtual visit for a follow-up of anxiety.    She reports overall good health, with the exception of mild fatigue. Her sleep pattern is normal. She is currently not employed and is managing her anxiety effectively with fluoxetine  and clonazepam . Her mood remains stable, although she occasionally experiences a sense of emotional flatness, which she attributes to her fatigue. Despite these challenges, she is able to perform her daily activities without significant difficulty, although she sometimes needs to exert extra effort.    SOCIAL HISTORY  She is currently not employed.  She has an 30-month old daughter and a school-aged daughter with special needs.    Review of Systems   Constitutional:  Positive for fatigue.   Psychiatric/Behavioral:  Negative for dysphoric mood. The patient is not nervous/anxious.           Objective   Patient-Reported Vitals  No data recorded     Physical Exam             --Leonetta Rama, MD

## 2023-08-06 NOTE — Assessment & Plan Note (Signed)
 Chronic, at goal (stable), continue current treatment plan    Orders:    clonazePAM  (KLONOPIN ) 1 MG tablet; Take 1 tablet by mouth in the morning and at bedtime for 60 days. Max Daily Amount: 2 mg

## 2023-08-16 MED ORDER — METHOCARBAMOL 500 MG PO TABS
500 | ORAL_TABLET | Freq: Three times a day (TID) | ORAL | 0 refills | 10.00000 days | Status: AC
Start: 2023-08-16 — End: 2023-08-26

## 2023-08-19 ENCOUNTER — Encounter: Payer: MEDICAID | Attending: Obstetrics & Gynecology | Primary: Family Medicine

## 2023-08-19 NOTE — Telephone Encounter (Signed)
 Sounds like pt might need an appt?  Could you assist?

## 2023-08-25 ENCOUNTER — Encounter: Payer: MEDICAID | Attending: Student in an Organized Health Care Education/Training Program | Primary: Family Medicine
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
## 2023-10-15 ENCOUNTER — Telehealth: Admit: 2023-10-15 | Discharge: 2023-10-15 | Payer: MEDICAID | Attending: Family Medicine | Primary: Family Medicine

## 2023-10-15 DIAGNOSIS — N926 Irregular menstruation, unspecified: Principal | ICD-10-CM

## 2023-10-15 MED ORDER — CLONAZEPAM 1 MG PO TABS
1 | ORAL_TABLET | Freq: Two times a day (BID) | ORAL | 1 refills | 30.00000 days | Status: DC
Start: 2023-10-15 — End: 2023-12-29

## 2023-10-15 MED ORDER — NORGESTIMATE-ETH ESTRADIOL 0.25-35 MG-MCG PO TABS
0.25-35 | PACK | Freq: Every day | ORAL | 3 refills | 84.00000 days | Status: DC
Start: 2023-10-15 — End: 2024-02-23

## 2023-10-15 MED ORDER — FLUOXETINE HCL 40 MG PO CAPS
40 | ORAL_CAPSULE | Freq: Every day | ORAL | 3 refills | 90.00000 days | Status: DC
Start: 2023-10-15 — End: 2024-03-25

## 2023-10-20 ENCOUNTER — Encounter: Payer: MEDICAID | Attending: Family Medicine | Primary: Family Medicine

## 2023-10-22 ENCOUNTER — Encounter: Payer: MEDICAID | Attending: Family Medicine | Primary: Family Medicine

## 2023-11-23 NOTE — Progress Notes (Signed)
 [name] [name], was evaluated through a synchronous (real-time) audio-video encounter. The patient (or guardian if applicable) is aware that this is a billable service, which includes applicable co-pays. This Virtual Visit was conducted with patient's (and/or legal guardian's) consent. Patient identification was verified, and a caregiver was present when appropriate.   The patient was located at Home: [address] [name] [name] [address]  [place] [address]  Provider was located at Facility (Appt Dept): [address] [place],  [place] [address]  Confirm you are appropriately licensed, registered, or certified to deliver care in the state where the patient is located as indicated above. If you are not or unsure, please re-schedule the visit: Yes, I confirm.     [name] [name] (DOB:  Jan 16, 1986) is a Established patient, presenting virtually for evaluation of the following:      Below is the assessment and plan developed based on review of pertinent history, physical exam, labs, studies, and medications.     Assessment & Plan  Anxiety   Chronic, at goal (stable), continue current treatment plan    Orders:    clonazePAM  (KLONOPIN ) 1 MG tablet; Take 1 tablet by mouth in the morning and at bedtime for 60 days. Max Daily Amount: 2 mg    Mixed anxiety and depressive disorder   Chronic, at goal (stable), continue current treatment plan    Orders:    FLUoxetine  (PROZAC ) 40 MG capsule; Take 1 capsule by mouth daily    Irregular menstruation   New, uncertain prognosis, changes made today: see below    Orders:    norgestimate -ethinyl estradiol  (SPRINTEC 28) 0.25-35 MG-MCG per tablet; Take 1 tablet by mouth daily    SCRIPTS reviewed.      Return in about 6 months (around 04/16/2024).       Subjective   Medication Refill  Pertinent negatives include no chest pain, chills or fever.     Pt presents virtually for follow up visit.   She reports doing weil with her fluoxetine  and clonazepam , and denies any side  effects.  She is managing well with her kids and reports a stable living situation.  She would like to get back on a birth control treatment to help regulate her cycles.      Review of Systems   Constitutional:  Negative for chills and fever.   Cardiovascular:  Negative for chest pain.   Psychiatric/Behavioral:  Negative for dysphoric mood.           Objective   Patient-Reported Vitals  No data recorded     Physical Exam  [INSTRUCTIONS:  [x]  Indicates a positive item  []  Indicates a negative item  -- DELETE ALL ITEMS NOT EXAMINED]    Constitutional: [x]  Appears well-developed and well-nourished [x]  No apparent distress      []  Abnormal -     Mental status: [x]  Alert and awake  [x]  Oriented to person/place/time []  Able to follow commands    []  Abnormal -     Eyes:   EOM    [x]   Normal    []  Abnormal -   Sclera  [x]   Normal    []  Abnormal -          Discharge []   None visible   []  Abnormal -     HENT: [x]  Normocephalic, atraumatic  []  Abnormal -   [x]  Mouth/Throat: Mucous membranes are moist    External Ears [x]  Normal  []  Abnormal -  Neck: []  No visualized mass []  Abnormal -     Pulmonary/Chest: [x]  Respiratory effort normal   [x]  No visualized signs of difficulty breathing or respiratory distress        []  Abnormal -      Musculoskeletal:   []  Normal gait with no signs of ataxia         []  Normal range of motion of neck        []  Abnormal -     Neurological:        []  No Facial Asymmetry (Cranial nerve 7 motor function) (limited exam due to video visit)          []  No gaze palsy        []  Abnormal -          Skin:        []  No significant exanthematous lesions or discoloration noted on facial skin         []  Abnormal -            Psychiatric:       [x]  Normal Affect []  Abnormal -        []  No Hallucinations    Other pertinent observable physical exam findings:-             --Earnie FORBES Jackson, MD

## 2023-11-23 NOTE — Assessment & Plan Note (Signed)
 Chronic, at goal (stable), continue current treatment plan    Orders:    clonazePAM  (KLONOPIN ) 1 MG tablet; Take 1 tablet by mouth in the morning and at bedtime for 60 days. Max Daily Amount: 2 mg

## 2023-11-23 NOTE — Assessment & Plan Note (Signed)
 Chronic, at goal (stable), continue current treatment plan    Orders:    FLUoxetine (PROZAC) 40 MG capsule; Take 1 capsule by mouth daily

## 2023-11-23 NOTE — Assessment & Plan Note (Signed)
 New, uncertain prognosis, changes made today: see below    Orders:    norgestimate -ethinyl estradiol  (SPRINTEC 28) 0.25-35 MG-MCG per tablet; Take 1 tablet by mouth daily    SCRIPTS reviewed.

## 2023-12-27 ENCOUNTER — Encounter

## 2023-12-29 ENCOUNTER — Encounter
Payer: Medicaid (Managed Care) | Attending: Student in an Organized Health Care Education/Training Program | Primary: Family Medicine

## 2023-12-29 ENCOUNTER — Encounter

## 2023-12-29 MED ORDER — CLONAZEPAM 1 MG PO TABS
1 | ORAL_TABLET | Freq: Two times a day (BID) | ORAL | 2 refills | 30.00000 days | Status: DC
Start: 2023-12-29 — End: 2024-03-25

## 2023-12-29 NOTE — Telephone Encounter (Signed)
"  Rx sent  "

## 2024-01-19 NOTE — Telephone Encounter (Signed)
"  Nob info to pt; continue med's.  "

## 2024-01-19 NOTE — Telephone Encounter (Signed)
"  lmp: can not remember   + test: yes, an at home test   prenatals: yes   otc meds: no   prescribed meds: prozac  and clonazepam    complications: patient denies complications with previous pregnancies, with current pregnancy patient is experiencing dark brown discharge and cramping   appt: 11/24 at 3:30 pm at the [facility]. P location with Dr. Sebastian   "

## 2024-01-26 ENCOUNTER — Encounter
Payer: Medicaid (Managed Care) | Attending: Student in an Organized Health Care Education/Training Program | Primary: Family Medicine

## 2024-02-23 ENCOUNTER — Encounter
Admit: 2024-02-23 | Discharge: 2024-02-23 | Payer: Medicaid (Managed Care) | Attending: Obstetrics & Gynecology | Primary: Family Medicine

## 2024-02-23 ENCOUNTER — Ambulatory Visit: Admit: 2024-02-23 | Discharge: 2024-02-23 | Payer: Medicaid (Managed Care) | Primary: Family Medicine

## 2024-02-23 VITALS — BP 124/69 | Ht 62.99 in | Wt 164.0 lb

## 2024-02-23 DIAGNOSIS — Z3201 Encounter for pregnancy test, result positive: Principal | ICD-10-CM

## 2024-02-23 NOTE — Progress Notes (Signed)
 "  [name] [name] (DOB:  1985/11/21) is a 38 y.o. female,Established patient, here for evaluation of the following chief complaint(s):  Initial Prenatal Visit      Assessment & Plan   ASSESSMENT/PLAN:  1. Pregnancy test-positive  -     US  OB TRANSVAGINAL (CLINIC PERFORMED); Future  Anxiety: Currently on 40 of Prozac  and 1 mg twice a day of clonazepam .  Will continue this this pregnancy as this is what she was on before pregnancy and with her last pregnancy  Prenatal labs  Unity  Advanced maternal age: Delivery 39 weeks.  Growth every 4 weeks in third trimester  Previous C-section x 2 Elective repeat C-section at 3 39 weeks  Desires permanent sterilization: Will sign tubal ligation papers at 39 weeks  History of IUGR  History of postpartum gestational hypertension  Will start on a baby aspirin with her next visit   02/23/2024  [name] [name]     Diagnosis Orders   1. Pregnancy test-positive  US  OB TRANSVAGINAL (CLINIC PERFORMED)      2. Supervision of normal intrauterine pregnancy in multigravida, first trimester  ABO/Rh    Antibody Screen    CBC with Auto Differential    Hemoglobin A1C    Hepatitis B Surface Antigen    HIV Screen    RPR    TSH reflex to FT4    Rubella antibody, IgG    Unity Aneuploidy NIPT    Unity Carrier Screen      3. Possible exposure to STD  Hepatitis B Surface Antigen    HIV Screen    RPR        No LMP recorded. (Menstrual status: Other - See Notes).  Estimated Date of Delivery: None noted.      EARLY PREGNANCY ULTRASOUND:  A limited, bedside pelvic ultrasound was performed using a transvaginal probe.  The medical necessity was to evaluate for signs of an intrauterine versus ectopic pregnancy.  The structures studied were the uterus and its contents, bladder, ovaries, vesicouterine space, and rectouterine space.    FINDINGS:  Evidence for an IUP was seen.  The study was technically adequate.    CRL: 4.33 cm  SDD: 11.1 weeks  EDD 09/12/2024    No LMP recorded. (Menstrual status: Other - See  Notes).  Estimated Date of Delivery: None noted.      The final Estimated Date of Confinement is: 09/12/2024        Subjective   SUBJECTIVE/OBJECTIVE:  HPI  38 year old gravida 5 para 2 at 11.1 weeks presents for return OB  Allergies   Allergen Reactions    Cephalexin  Anaphylaxis     Other reaction(s): Nausea    Doxycycline Anaphylaxis     Other Reaction(s): ANAPHYLAXIS    Doxycycline Calcium Anaphylaxis    Levofloxacin Anaphylaxis and Other (See Comments)     Other Reaction(s): ANAPHYLAXIS    Ketorolac      Morphine       Current Outpatient Medications   Medication Sig Dispense Refill    clonazePAM  (KLONOPIN ) 1 MG tablet Take 1 tablet by mouth in the morning and at bedtime for 60 days. Max Daily Amount: 2 mg 60 tablet 2    FLUoxetine  (PROZAC ) 40 MG capsule Take 1 capsule by mouth daily 30 capsule 3    Multiple Vitamins-Minerals (MULTIVITAMIN ADULTS) TABS Take 1 tablet by mouth daily       No current facility-administered medications for this visit.       Past Medical History:   Diagnosis Date  Anxiety     Crohn's colitis [facility])     Depression      Past Surgical History:   Procedure Laterality Date    CESAREAN SECTION  08/22/2014    CESAREAN SECTION N/A 12/04/2022    CESAREAN SECTION performed by Wetzel Mitchell FALCON, MD at [facility] L&D OR    CHOLECYSTECTOMY  2021    TONSILLECTOMY  2009    WISDOM TOOTH EXTRACTION  03/04/2010     Social History     Socioeconomic History    Marital status: Single     Spouse name: Not on file    Number of children: Not on file    Years of education: Not on file    Highest education level: Not on file   Occupational History    Not on file   Tobacco Use    Smoking status: Former     Current packs/day: 0.00     Average packs/day: 0.5 packs/day for 5.0 years (2.5 ttl pk-yrs)     Types: Cigarettes     Start date: 12/03/2015     Quit date: 12/2020     Years since quitting: 3.2    Smokeless tobacco: Never   Vaping Use    Vaping status: Never Used   Substance and Sexual Activity    Alcohol use: Not  Currently    Drug use: Never    Sexual activity: Yes     Partners: Male   Other Topics Concern    Not on file   Social History Narrative    Not on file     Social Drivers of Health     Financial Resource Strain: Not on file   Food Insecurity: No Food Insecurity (12/04/2022)    Hunger Vital Sign     Worried About Running Out of Food in the Last Year: Never true     Ran Out of Food in the Last Year: Never true   Transportation Needs: No Transportation Needs (12/04/2022)    PRAPARE - Therapist, Art (Medical): No     Lack of Transportation (Non-Medical): No   Physical Activity: Not on file   Stress: Not on file   Social Connections: Not on file   Intimate Partner Violence: Not on file   Housing Stability: High Risk (12/04/2022)    Housing Stability Vital Sign     Unable to Pay for Housing in the Last Year: No     Number of Times Moved in the Last Year: 2     Homeless in the Last Year: No     Family History   Problem Relation Age of Onset    Unknown Father     Breast Cancer Mother        Review of Systems   Review of Systems - Breast ROS: negative for breast lumps  Respiratory ROS: negative  Cardiovascular ROS: negative  Gastrointestinal ROS: negative  Genito-Urinary ROS: no dysuria, trouble voiding, or hematuria  Musculoskeletal ROS: negative      Objective   Blood pressure 124/69, height 1.6 m (5' 2.99), weight 74.4 kg (164 lb), currently breastfeeding.    Physical Exam   Physical Examination: General appearance - alert, well appearing, and in no distress  Chest - clear to auscultation, no wheezes, rales or rhonchi, symmetric air entry  Heart - normal rate, regular rhythm, normal S1, S2, no murmurs, rubs, clicks or gallops  Abdomen - soft, nontender, nondistended, no masses or organomegaly  Breasts - breasts  appear normal, no suspicious masses, no skin or nipple changes or axillary nodes  Pelvic - normal external genitalia, vulva, vagina, cervix, uterus and adnexa  Musculoskeletal - no joint  tenderness, deformity or swelling        An electronic signature was used to authenticate this note.    --Mitchell JULIANNA Anger, MD   "

## 2024-03-10 ENCOUNTER — Encounter

## 2024-03-10 LAB — CBC WITH AUTO DIFFERENTIAL
Basophils %: 0.6 % (ref 0.0–2.0)
Basophils Absolute: 0.1 x10e3/mcL (ref 0.0–0.2)
Eosinophils %: 2.6 % (ref 0.0–7.0)
Eosinophils Absolute: 0.3 x10e3/mcL (ref 0.0–0.5)
Hematocrit: 36.6 % (ref 34.0–47.0)
Hemoglobin: 12.6 g/dL (ref 11.5–15.7)
Immature Grans (Abs): 0.09 x10e3/mcL — ABNORMAL HIGH (ref 0.00–0.06)
Immature Granulocytes %: 0.9 % — ABNORMAL HIGH (ref 0.0–0.6)
Lymphocytes Absolute: 2.4 x10e3/mcL (ref 1.0–3.2)
Lymphocytes: 23.9 % (ref 15.0–45.0)
MCH: 31 pg (ref 27.0–34.5)
MCHC: 34.4 g/dL (ref 30.0–36.0)
MCV: 90.1 fL (ref 81.0–99.0)
MPV: 9.7 fL (ref 7.0–12.2)
Monocytes %: 7.1 % (ref 4.0–12.0)
Monocytes Absolute: 0.7 x10e3/mcL (ref 0.3–1.0)
NRBC Absolute: 0 x10e3/mcL (ref 0.00–0.01)
NRBC Automated: 0 % (ref 0.0–0.2)
Neutrophils %: 64.9 % (ref 42.0–74.0)
Neutrophils Absolute: 6.6 x10e3/mcL (ref 1.6–7.3)
Platelets: 369 x10e3/mcL (ref 140–440)
RBC: 4.06 x10e6/mcL (ref 3.60–5.20)
RDW: 14.6 % (ref 10.0–17.0)
WBC: 10.2 x10e3/mcL (ref 3.8–10.6)

## 2024-03-10 LAB — HEMOGLOBIN A1C
Estimated Avg Glucose: 101
Estimated Avg Glucose: 97
Hemoglobin A1C: 5 % (ref 4.0–6.0)

## 2024-03-10 LAB — HIV SCREEN: HIV Screen: NEGATIVE

## 2024-03-10 LAB — RUBELLA ANTIBODY, IGG
Rubella IgG Scr Interp: REACTIVE
Rubella IgG Scr: 45.1 [IU]/mL

## 2024-03-10 LAB — HEPATITIS B SURFACE ANTIGEN: Hepatitis B Surface Ag: NEGATIVE

## 2024-03-10 LAB — TSH REFLEX TO FT4: TSH: 1.11 u[IU]/mL (ref 0.358–3.740)

## 2024-03-11 LAB — RPR: RPR: NONREACTIVE

## 2024-03-12 LAB — ANTIBODY SCREEN: Antibody Screen: NEGATIVE

## 2024-03-12 LAB — ABO/RH: ABO/Rh: B NEG

## 2024-03-15 LAB — UNITY CARRIER SCREEN
Alpha-Thalassemia carrier screen: NEGATIVE
Cystic Fibrosis carrier screen: NEGATIVE
Sickle Cell Disease/Beta-Thalassemia/Hemoglobinopathies carrier screen: NEGATIVE
Spinal Muscular Atrophy carrier screen: NEGATIVE

## 2024-03-20 LAB — UNITY ANEUPLOIDY NIPT
Fetal Fraction: 10.2
Sex chromosome aneuploidy: NOT DETECTED

## 2024-03-25 ENCOUNTER — Encounter
Admit: 2024-03-25 | Discharge: 2024-03-25 | Payer: Medicaid (Managed Care) | Attending: Obstetrics & Gynecology | Primary: Family Medicine

## 2024-03-25 VITALS — BP 97/60 | Ht 62.99 in | Wt 173.4 lb

## 2024-03-25 DIAGNOSIS — O09522 Supervision of elderly multigravida, second trimester: Principal | ICD-10-CM

## 2024-03-25 MED ORDER — FLUOXETINE HCL 40 MG PO CAPS
40 | ORAL_CAPSULE | Freq: Every day | ORAL | 3 refills | 90.00000 days | Status: AC
Start: 2024-03-25 — End: ?

## 2024-03-25 MED ORDER — ONDANSETRON HCL 4 MG PO TABS
4 | ORAL_TABLET | Freq: Three times a day (TID) | ORAL | 1 refills | 7.00000 days | Status: AC | PRN
Start: 2024-03-25 — End: ?

## 2024-03-25 MED ORDER — CLONAZEPAM 1 MG PO TABS
1 | ORAL_TABLET | Freq: Two times a day (BID) | ORAL | 0 refills | 30.00000 days | Status: AC | PRN
Start: 2024-03-25 — End: 2024-05-24

## 2024-03-25 NOTE — Assessment & Plan Note (Signed)
"     Orders:    FLUoxetine  (PROZAC ) 40 MG capsule; Take 1 capsule by mouth daily    "

## 2024-03-25 NOTE — Assessment & Plan Note (Signed)
 Orders:    clonazePAM  (KLONOPIN ) 1 MG tablet; Take 1 tablet by mouth 2 times daily as needed for Anxiety for up to 60 days. Max Daily Amount: 2 mg

## 2024-03-25 NOTE — Progress Notes (Signed)
 [name] [name] (DOB:  08/08/1985) is a 39 y.o. female,Established patient, here for evaluation of the following chief complaint(s):  Routine Prenatal Visit (Need Rx for nausea medicine. )         Assessment & Plan  AMA (advanced maternal age) multigravida 35+, second trimester   Anxiety: Currently on 40 of Prozac  and 1 mg twice a day of clonazepam .  Will continue this this pregnancy as this is what she was on before pregnancy and with her last pregnancy  Prenatal labs  Unity  Advanced maternal age: Delivery 39 weeks.  Growth every 4 weeks in third trimester  Previous C-section x 2 Elective repeat C-section at 3 39 weeks  Desires permanent sterilization: Will sign tubal ligation papers at 39 weeks  History of IUGR  History of postpartum gestational hypertension  Will start on a baby aspirin with her next visit  B negative: Needs RhoGAM  Orders:    US  OB 14 PLUS WEEKS SINGLE OR FIRST GESTATION; Future    Mixed anxiety and depressive disorder       Orders:    FLUoxetine  (PROZAC ) 40 MG capsule; Take 1 capsule by mouth daily    Anxiety       Orders:    clonazePAM  (KLONOPIN ) 1 MG tablet; Take 1 tablet by mouth 2 times daily as needed for Anxiety for up to 60 days. Max Daily Amount: 2 mg    Antenatal screening encounter       Orders:    US  OB 14 PLUS WEEKS SINGLE OR FIRST GESTATION; Future      Return in about 4 weeks (around 04/22/2024) for ROB, US .       Subjective   HPI  39 year old gravida 6 para 2 at 15.4 weeks presents for return OB  Review of Systems       Objective   Blood pressure 97/60, height 1.6 m (5' 2.99), weight 78.7 kg (173 lb 6.4 oz), currently breastfeeding.    Physical Exam       Fetal wellbeing 160      An electronic signature was used to authenticate this note.    --Mitchell JULIANNA Anger, MD

## 2024-04-19 ENCOUNTER — Encounter: Attending: Obstetrics & Gynecology | Primary: Family Medicine

## 2024-04-19 ENCOUNTER — Encounter: Primary: Family Medicine
